# Patient Record
Sex: Female | Born: 1955 | Race: White | Hispanic: No | Marital: Married | Smoking: Former smoker
Health system: Southern US, Community
[De-identification: ages and names within clinical notes are randomized; demographics above are authoritative.]

## PROBLEM LIST (undated history)

## (undated) DIAGNOSIS — G5710 Meralgia paresthetica, unspecified lower limb: Secondary | ICD-10-CM

## (undated) DIAGNOSIS — M549 Dorsalgia, unspecified: Secondary | ICD-10-CM

## (undated) DIAGNOSIS — T7840XA Allergy, unspecified, initial encounter: Secondary | ICD-10-CM

## (undated) DIAGNOSIS — E785 Hyperlipidemia, unspecified: Secondary | ICD-10-CM

## (undated) DIAGNOSIS — K219 Gastro-esophageal reflux disease without esophagitis: Secondary | ICD-10-CM

## (undated) DIAGNOSIS — U071 COVID-19: Secondary | ICD-10-CM

## (undated) DIAGNOSIS — Z780 Asymptomatic menopausal state: Secondary | ICD-10-CM

## (undated) DIAGNOSIS — R7301 Impaired fasting glucose: Secondary | ICD-10-CM

## (undated) DIAGNOSIS — K5903 Drug induced constipation: Secondary | ICD-10-CM

## (undated) DIAGNOSIS — Z9889 Other specified postprocedural states: Secondary | ICD-10-CM

## (undated) DIAGNOSIS — F329 Major depressive disorder, single episode, unspecified: Secondary | ICD-10-CM

## (undated) DIAGNOSIS — I639 Cerebral infarction, unspecified: Secondary | ICD-10-CM

## (undated) DIAGNOSIS — I10 Essential (primary) hypertension: Secondary | ICD-10-CM

## (undated) DIAGNOSIS — K759 Inflammatory liver disease, unspecified: Secondary | ICD-10-CM

## (undated) DIAGNOSIS — E119 Type 2 diabetes mellitus without complications: Secondary | ICD-10-CM

## (undated) DIAGNOSIS — K760 Fatty (change of) liver, not elsewhere classified: Secondary | ICD-10-CM

## (undated) DIAGNOSIS — I719 Aortic aneurysm of unspecified site, without rupture: Secondary | ICD-10-CM

## (undated) DIAGNOSIS — F32A Depression, unspecified: Secondary | ICD-10-CM

## (undated) DIAGNOSIS — I739 Peripheral vascular disease, unspecified: Secondary | ICD-10-CM

## (undated) DIAGNOSIS — IMO0002 Reserved for concepts with insufficient information to code with codable children: Secondary | ICD-10-CM

## (undated) DIAGNOSIS — J189 Pneumonia, unspecified organism: Secondary | ICD-10-CM

## (undated) DIAGNOSIS — G43909 Migraine, unspecified, not intractable, without status migrainosus: Secondary | ICD-10-CM

## (undated) DIAGNOSIS — O24419 Gestational diabetes mellitus in pregnancy, unspecified control: Secondary | ICD-10-CM

## (undated) DIAGNOSIS — M419 Scoliosis, unspecified: Secondary | ICD-10-CM

## (undated) DIAGNOSIS — G8929 Other chronic pain: Secondary | ICD-10-CM

## (undated) DIAGNOSIS — T402X5A Adverse effect of other opioids, initial encounter: Secondary | ICD-10-CM

## (undated) DIAGNOSIS — R7303 Prediabetes: Secondary | ICD-10-CM

## (undated) DIAGNOSIS — F419 Anxiety disorder, unspecified: Secondary | ICD-10-CM

## (undated) DIAGNOSIS — M5136 Other intervertebral disc degeneration, lumbar region: Secondary | ICD-10-CM

## (undated) DIAGNOSIS — R011 Cardiac murmur, unspecified: Secondary | ICD-10-CM

## (undated) DIAGNOSIS — M51369 Other intervertebral disc degeneration, lumbar region without mention of lumbar back pain or lower extremity pain: Secondary | ICD-10-CM

## (undated) HISTORY — DX: Impaired fasting glucose: R73.01

## (undated) HISTORY — DX: Type 2 diabetes mellitus without complications: E11.9

## (undated) HISTORY — PX: ABDOMINAL HYSTERECTOMY: SHX81

## (undated) HISTORY — DX: Adverse effect of other opioids, initial encounter: T40.2X5A

## (undated) HISTORY — DX: Fatty (change of) liver, not elsewhere classified: K76.0

## (undated) HISTORY — DX: Gastro-esophageal reflux disease without esophagitis: K21.9

## (undated) HISTORY — DX: Aortic aneurysm of unspecified site, without rupture: I71.9

## (undated) HISTORY — DX: Essential (primary) hypertension: I10

## (undated) HISTORY — DX: Depression, unspecified: F32.A

## (undated) HISTORY — DX: Cerebral infarction, unspecified: I63.9

## (undated) HISTORY — PX: POLYPECTOMY: SHX149

## (undated) HISTORY — DX: Cardiac murmur, unspecified: R01.1

## (undated) HISTORY — PX: JOINT REPLACEMENT: SHX530

## (undated) HISTORY — DX: Drug induced constipation: K59.03

## (undated) HISTORY — DX: Other intervertebral disc degeneration, lumbar region without mention of lumbar back pain or lower extremity pain: M51.369

## (undated) HISTORY — DX: Hyperlipidemia, unspecified: E78.5

## (undated) HISTORY — DX: Dorsalgia, unspecified: M54.9

## (undated) HISTORY — DX: Migraine, unspecified, not intractable, without status migrainosus: G43.909

## (undated) HISTORY — DX: Major depressive disorder, single episode, unspecified: F32.9

## (undated) HISTORY — DX: Meralgia paresthetica, unspecified lower limb: G57.10

## (undated) HISTORY — PX: OTHER SURGICAL HISTORY: SHX169

## (undated) HISTORY — DX: Reserved for concepts with insufficient information to code with codable children: IMO0002

## (undated) HISTORY — DX: Asymptomatic menopausal state: Z78.0

## (undated) HISTORY — DX: Other intervertebral disc degeneration, lumbar region: M51.36

## (undated) HISTORY — DX: Other chronic pain: G89.29

## (undated) HISTORY — DX: Allergy, unspecified, initial encounter: T78.40XA

## (undated) HISTORY — PX: COLONOSCOPY: SHX174

## (undated) HISTORY — DX: Scoliosis, unspecified: M41.9

---

## 1992-03-27 DIAGNOSIS — J189 Pneumonia, unspecified organism: Secondary | ICD-10-CM

## 1992-03-27 HISTORY — DX: Pneumonia, unspecified organism: J18.9

## 1998-07-26 ENCOUNTER — Other Ambulatory Visit: Admission: RE | Admit: 1998-07-26 | Discharge: 1998-07-26 | Payer: Self-pay | Admitting: Obstetrics and Gynecology

## 1999-02-15 ENCOUNTER — Other Ambulatory Visit: Admission: RE | Admit: 1999-02-15 | Discharge: 1999-02-15 | Payer: Self-pay | Admitting: Otolaryngology

## 1999-11-30 ENCOUNTER — Encounter: Payer: Self-pay | Admitting: Otolaryngology

## 1999-11-30 ENCOUNTER — Encounter: Admission: RE | Admit: 1999-11-30 | Discharge: 1999-11-30 | Payer: Self-pay | Admitting: Otolaryngology

## 2001-02-13 ENCOUNTER — Other Ambulatory Visit: Admission: RE | Admit: 2001-02-13 | Discharge: 2001-02-13 | Payer: Self-pay | Admitting: Obstetrics and Gynecology

## 2002-08-27 ENCOUNTER — Other Ambulatory Visit: Admission: RE | Admit: 2002-08-27 | Discharge: 2002-08-27 | Payer: Self-pay | Admitting: Obstetrics and Gynecology

## 2003-07-29 ENCOUNTER — Ambulatory Visit (HOSPITAL_COMMUNITY): Admission: RE | Admit: 2003-07-29 | Discharge: 2003-07-29 | Payer: Self-pay | Admitting: Orthopedic Surgery

## 2003-07-29 ENCOUNTER — Ambulatory Visit (HOSPITAL_BASED_OUTPATIENT_CLINIC_OR_DEPARTMENT_OTHER): Admission: RE | Admit: 2003-07-29 | Discharge: 2003-07-29 | Payer: Self-pay | Admitting: Orthopedic Surgery

## 2004-02-10 ENCOUNTER — Other Ambulatory Visit: Admission: RE | Admit: 2004-02-10 | Discharge: 2004-02-10 | Payer: Self-pay | Admitting: Obstetrics and Gynecology

## 2004-06-06 ENCOUNTER — Inpatient Hospital Stay (HOSPITAL_COMMUNITY): Admission: RE | Admit: 2004-06-06 | Discharge: 2004-06-08 | Payer: Self-pay | Admitting: Obstetrics and Gynecology

## 2005-08-22 ENCOUNTER — Inpatient Hospital Stay (HOSPITAL_COMMUNITY): Admission: RE | Admit: 2005-08-22 | Discharge: 2005-08-24 | Payer: Self-pay | Admitting: Orthopedic Surgery

## 2006-03-28 ENCOUNTER — Ambulatory Visit: Payer: Self-pay | Admitting: Internal Medicine

## 2006-04-11 ENCOUNTER — Ambulatory Visit: Payer: Self-pay | Admitting: Internal Medicine

## 2009-03-27 HISTORY — PX: BACK SURGERY: SHX140

## 2009-06-03 DIAGNOSIS — J309 Allergic rhinitis, unspecified: Secondary | ICD-10-CM | POA: Insufficient documentation

## 2009-06-03 DIAGNOSIS — R011 Cardiac murmur, unspecified: Secondary | ICD-10-CM | POA: Insufficient documentation

## 2012-05-20 ENCOUNTER — Encounter (HOSPITAL_COMMUNITY): Payer: Self-pay | Admitting: *Deleted

## 2012-05-20 ENCOUNTER — Emergency Department (HOSPITAL_COMMUNITY): Payer: BC Managed Care – PPO

## 2012-05-20 ENCOUNTER — Emergency Department (HOSPITAL_COMMUNITY)
Admission: EM | Admit: 2012-05-20 | Discharge: 2012-05-20 | Disposition: A | Discharge status: Home / Self Care | Payer: BC Managed Care – PPO | Attending: Emergency Medicine | Admitting: Emergency Medicine

## 2012-05-20 DIAGNOSIS — Z7982 Long term (current) use of aspirin: Secondary | ICD-10-CM | POA: Insufficient documentation

## 2012-05-20 DIAGNOSIS — Z79899 Other long term (current) drug therapy: Secondary | ICD-10-CM | POA: Insufficient documentation

## 2012-05-20 DIAGNOSIS — R110 Nausea: Secondary | ICD-10-CM | POA: Insufficient documentation

## 2012-05-20 DIAGNOSIS — Z90710 Acquired absence of both cervix and uterus: Secondary | ICD-10-CM | POA: Insufficient documentation

## 2012-05-20 DIAGNOSIS — R109 Unspecified abdominal pain: Secondary | ICD-10-CM

## 2012-05-20 DIAGNOSIS — Z8632 Personal history of gestational diabetes: Secondary | ICD-10-CM | POA: Insufficient documentation

## 2012-05-20 DIAGNOSIS — R1013 Epigastric pain: Secondary | ICD-10-CM | POA: Insufficient documentation

## 2012-05-20 HISTORY — DX: Gestational diabetes mellitus in pregnancy, unspecified control: O24.419

## 2012-05-20 LAB — COMPREHENSIVE METABOLIC PANEL
ALT: 16 U/L (ref 0–35)
AST: 14 U/L (ref 0–37)
Albumin: 4 g/dL (ref 3.5–5.2)
Alkaline Phosphatase: 78 U/L (ref 39–117)
BUN: 13 mg/dL (ref 6–23)
CO2: 23 mEq/L (ref 19–32)
Calcium: 9.4 mg/dL (ref 8.4–10.5)
Chloride: 103 mEq/L (ref 96–112)
Creatinine, Ser: 0.46 mg/dL — ABNORMAL LOW (ref 0.50–1.10)
GFR calc Af Amer: >90 mL/min (ref >90)
GFR calc non Af Amer: >90 mL/min (ref >90)
Glucose, Bld: 112 mg/dL — ABNORMAL HIGH (ref 70–99)
Potassium: 3.6 mEq/L (ref 3.5–5.1)
Sodium: 137 mEq/L (ref 135–145)
Total Bilirubin: 0.2 mg/dL — ABNORMAL LOW (ref 0.3–1.2)
Total Protein: 7 g/dL (ref 6.0–8.3)

## 2012-05-20 LAB — CBC
HCT: 38.4 % (ref 36.0–46.0)
Hemoglobin: 13 g/dL (ref 12.0–15.0)
MCH: 31.1 pg (ref 26.0–34.0)
MCHC: 33.9 g/dL (ref 30.0–36.0)
MCV: 91.9 fL (ref 78.0–100.0)
Platelets: 260 10*3/uL (ref 150–400)
RBC: 4.18 MIL/uL (ref 3.87–5.11)
RDW: 12.6 % (ref 11.5–15.5)
WBC: 9.1 10*3/uL (ref 4.0–10.5)

## 2012-05-20 LAB — URINALYSIS, ROUTINE W REFLEX MICROSCOPIC
Bilirubin Urine: NEGATIVE
Glucose, UA: NEGATIVE mg/dL
Hgb urine dipstick: NEGATIVE
Ketones, ur: NEGATIVE mg/dL
Nitrite: NEGATIVE
Protein, ur: NEGATIVE mg/dL
Specific Gravity, Urine: 1.014 (ref 1.005–1.030)
Urobilinogen, UA: 0.2 mg/dL (ref 0.0–1.0)
pH: 5.5 (ref 5.0–8.0)

## 2012-05-20 LAB — URINE MICROSCOPIC-ADD ON

## 2012-05-20 LAB — LIPASE, BLOOD: Lipase: 39 U/L (ref 11–59)

## 2012-05-20 MED ORDER — PANTOPRAZOLE SODIUM 20 MG PO TBEC: 20.0000 mg | DELAYED_RELEASE_TABLET | Freq: Every day | ORAL | Status: DC

## 2012-05-20 NOTE — ED Notes (Signed)
Pt reports epigastric pain intermittently x 1 week, became worse Saturday.  Pt reports pain is worse when eating, states that she starts to feel full when she starts eating and her mid upper abd feels "hard."  Pt reports mild nausea with this time as well.

## 2012-05-20 NOTE — ED Provider Notes (Signed)
History    56yF with abdominal pain. Epigastric. Does not radiate. First noticed when woke up Saturday morning. Relatively constant since. "Comes in waves." Doesn't completely subside though. Sometime worse after eating. No appreciable exacerbating or relieving factors otherwise. Nausea with more intense pain. No vomiting.  No urinary complaints. No fever or chills. No hx of PUD/reflux. Occasional ETOH. No hx of pancreatitis. Tried taking TUMS on Saturday w/o no effect. Surgical hx significant for hysterectomy. Declining pain meds at this time.   CSN: [id number]  Arrival date & time 05/20/12  1400   First MD Initiated Contact with Patient 05/20/12 1458      Chief Complaint  Patient presents with  . Abdominal Pain    (Consider location/radiation/quality/duration/timing/severity/associated sxs/prior treatment) HPI  Past Medical History  Diagnosis Date  . Gestational diabetes     Past Surgical History  Procedure Laterality Date  . Back surgery    . Joint replacement      L knee  . Abdominal hysterectomy      No family history on file.  History  Substance Use Topics  . Smoking status: Never Smoker   . Smokeless tobacco: Not on file  . Alcohol Use: Yes     Comment: occa    OB History   Grav Para Term Preterm Abortions TAB SAB Ect Mult Living                  Review of Systems  All systems reviewed and negative, other than as noted in HPI.   Allergies  Sulfa antibiotics  Home Medications   Current Outpatient Rx  Name  Route  Sig  Dispense  Refill  . aspirin EC 81 MG tablet   Oral   Take 81 mg by mouth daily.         . diphenhydramine-acetaminophen (TYLENOL PM) 25-500 MG TABS   Oral   Take 1 tablet by mouth at bedtime as needed. For pain.         . fish oil-omega-3 fatty acids 1000 MG capsule   Oral   Take 1 g by mouth daily.         . vitamin C (ASCORBIC ACID) 500 MG tablet   Oral   Take 500 mg by mouth daily.           BP 144/85  Pulse  93  Temp(Src) 98.3 F (36.8 C) (Oral)  SpO2 100%  Physical Exam  Nursing note and vitals reviewed. Constitutional: She appears well-developed and well-nourished. No distress.  Sitting in chair beside bed. NAD.   HENT:  Head: Normocephalic and atraumatic.  Eyes: Conjunctivae are normal. Right eye exhibits no discharge. Left eye exhibits no discharge.  Neck: Neck supple.  Cardiovascular: Normal rate, regular rhythm and normal heart sounds.  Exam reveals no gallop and no friction rub.   No murmur heard. Pulmonary/Chest: Effort normal and breath sounds normal. No respiratory distress.  Abdominal: Soft. She exhibits no distension and no mass. There is tenderness. There is no rebound and no guarding.  Mild epigastric tenderness w/o rebound or guarding. No distension. No pulsatile mass.   Genitourinary:  No cva tenderness  Musculoskeletal: She exhibits no edema and no tenderness.  Neurological: She is alert.  Skin: Skin is warm and dry.  Psychiatric: She has a normal mood and affect. Her behavior is normal. Thought content normal.    ED Course  Procedures (including critical care time)  Labs Reviewed  COMPREHENSIVE METABOLIC PANEL - Abnormal; Notable for the  following:    Glucose, Bld 112 (*)    Creatinine, Ser 0.46 (*)    Total Bilirubin 0.2 (*)    All other components within normal limits  URINALYSIS, ROUTINE W REFLEX MICROSCOPIC - Abnormal; Notable for the following:    Leukocytes, UA TRACE (*)    All other components within normal limits  CBC  LIPASE, BLOOD  URINE MICROSCOPIC-ADD ON   US Abdomen Complete  05/20/2012  *RADIOLOGY REPORT*  Clinical Data:  Abdominal pain, nausea.  COMPLETE ABDOMINAL ULTRASOUND  Comparison:  None.  Findings:  Gallbladder:  Gallbladder is contracted.  The patient was not n.p.o. for the study.  No wall thickening.  No visible stones. Negative sonographic Murphy's.  Common bile duct:   Normal caliber, 3 mm.  Liver:  Increased echotexture suggesting  fatty infiltration.  No focal abnormality.  IVC:  Appears normal.  Pancreas:  No focal abnormality seen.  Spleen:  Within normal limits in size and echotexture.  Right Kidney:   Normal in size and parenchymal echogenicity.  No evidence of mass or hydronephrosis.  Left Kidney:  Normal in size and parenchymal echogenicity.  No evidence of mass or hydronephrosis.  Abdominal aorta:  No aneurysm identified.  IMPRESSION: Fatty infiltration of the liver.  Contracted gallbladder.  The patient was not n.p.o. for the study. No visible stones or evidence of acute cholecystitis.   Original Report Authenticated By: Charlett Nose, M.D.    EKG:  Rhythm: normal sinus Rate: 85 Axis: normal Intervals: normal ST segments: normal   1. Abdominal pain       MDM  56yF with epigastric pain. Mild tenderness on exam. Well appearing. Afebrile and HD stable. Possibly reflux or PUD. Possibly biliary colic with pain "coming in waves" and worse after eating. Will check [place] and labs. Will check EKG as well, although very atypical symptoms for ACS. CT likely very low yield.   5:03 PM W/u unremarkable. [place] w/ fatty liver, otherwise unremarkable. Lipase normal. Only mild tenderness on exam. Very low suspicion for acute emergent surgical process. Will give trial of protonix. Emergent return precautions discussed. Outpt FU otherwise.        Raeford Razor, MD 05/20/12 [phone]

## 2012-05-29 DIAGNOSIS — K219 Gastro-esophageal reflux disease without esophagitis: Secondary | ICD-10-CM | POA: Insufficient documentation

## 2012-10-07 DIAGNOSIS — S83419A Sprain of medial collateral ligament of unspecified knee, initial encounter: Secondary | ICD-10-CM | POA: Insufficient documentation

## 2012-10-07 DIAGNOSIS — S93401A Sprain of unspecified ligament of right ankle, initial encounter: Secondary | ICD-10-CM | POA: Insufficient documentation

## 2013-03-07 ENCOUNTER — Encounter: Payer: Self-pay | Admitting: Internal Medicine

## 2013-03-11 DIAGNOSIS — M549 Dorsalgia, unspecified: Secondary | ICD-10-CM | POA: Insufficient documentation

## 2013-03-11 DIAGNOSIS — M5417 Radiculopathy, lumbosacral region: Secondary | ICD-10-CM | POA: Insufficient documentation

## 2013-04-01 ENCOUNTER — Encounter: Payer: Self-pay | Admitting: Internal Medicine

## 2013-05-19 ENCOUNTER — Other Ambulatory Visit: Payer: Self-pay

## 2013-05-19 DIAGNOSIS — Z1231 Encounter for screening mammogram for malignant neoplasm of breast: Secondary | ICD-10-CM

## 2013-06-02 ENCOUNTER — Ambulatory Visit: Payer: BC Managed Care – PPO

## 2013-06-09 DIAGNOSIS — R809 Proteinuria, unspecified: Secondary | ICD-10-CM | POA: Insufficient documentation

## 2013-06-20 ENCOUNTER — Encounter: Payer: Self-pay | Admitting: Obstetrics and Gynecology

## 2013-07-30 DIAGNOSIS — G8929 Other chronic pain: Secondary | ICD-10-CM | POA: Insufficient documentation

## 2013-07-30 DIAGNOSIS — M5416 Radiculopathy, lumbar region: Secondary | ICD-10-CM | POA: Insufficient documentation

## 2014-01-02 DIAGNOSIS — M545 Low back pain, unspecified: Secondary | ICD-10-CM | POA: Insufficient documentation

## 2014-01-22 DIAGNOSIS — M533 Sacrococcygeal disorders, not elsewhere classified: Secondary | ICD-10-CM | POA: Insufficient documentation

## 2014-07-29 DIAGNOSIS — F329 Major depressive disorder, single episode, unspecified: Secondary | ICD-10-CM | POA: Insufficient documentation

## 2015-01-21 DIAGNOSIS — M542 Cervicalgia: Secondary | ICD-10-CM | POA: Insufficient documentation

## 2016-02-29 DIAGNOSIS — I10 Essential (primary) hypertension: Secondary | ICD-10-CM | POA: Insufficient documentation

## 2016-04-05 ENCOUNTER — Encounter: Payer: Self-pay | Admitting: Gastroenterology

## 2016-04-17 ENCOUNTER — Encounter: Payer: Self-pay | Admitting: Gastroenterology

## 2016-06-01 ENCOUNTER — Ambulatory Visit (AMBULATORY_SURGERY_CENTER): Payer: Self-pay | Admitting: *Deleted

## 2016-06-01 VITALS — Ht 66.0 in | Wt 160.6 lb

## 2016-06-01 DIAGNOSIS — Z1211 Encounter for screening for malignant neoplasm of colon: Secondary | ICD-10-CM

## 2016-06-01 MED ORDER — NA SULFATE-K SULFATE-MG SULF 17.5-3.13-1.6 GM/177ML PO SOLN: 1.0000 | Freq: Once | ORAL | 0 refills | Status: AC

## 2016-06-01 NOTE — Progress Notes (Signed)
No egg or soy allergy known to patient   issues with past sedation with any surgeries  or procedures of PONV, no intubation problems  No diet pills per patient No home 02 use per patient  No blood thinners per patient  Pt states  issues with constipation due to pain meds- pt uses OTC meds prn - if no stool in 2 days she uses -not daily stools - stools are hard - large hard stools then can pass easier- will do 2 day prep  No A fib or A flutter

## 2016-06-06 ENCOUNTER — Encounter: Payer: Self-pay | Admitting: Gastroenterology

## 2016-06-12 ENCOUNTER — Encounter: Payer: Self-pay | Admitting: Gastroenterology

## 2016-06-12 ENCOUNTER — Ambulatory Visit (AMBULATORY_SURGERY_CENTER): Payer: BLUE CROSS/BLUE SHIELD | Admitting: Gastroenterology

## 2016-06-12 VITALS — BP 119/67 | HR 78 | Temp 98.4°F | Resp 10 | Ht 66.0 in | Wt 160.0 lb

## 2016-06-12 DIAGNOSIS — Z1211 Encounter for screening for malignant neoplasm of colon: Secondary | ICD-10-CM

## 2016-06-12 DIAGNOSIS — Z1212 Encounter for screening for malignant neoplasm of rectum: Secondary | ICD-10-CM

## 2016-06-12 DIAGNOSIS — D128 Benign neoplasm of rectum: Secondary | ICD-10-CM | POA: Diagnosis not present

## 2016-06-12 DIAGNOSIS — K635 Polyp of colon: Secondary | ICD-10-CM

## 2016-06-12 DIAGNOSIS — D125 Benign neoplasm of sigmoid colon: Secondary | ICD-10-CM

## 2016-06-12 MED ORDER — SODIUM CHLORIDE 0.9 % IV SOLN: 500.0000 mL | INTRAVENOUS | Status: DC

## 2016-06-12 NOTE — Patient Instructions (Signed)
YOU HAD AN ENDOSCOPIC PROCEDURE TODAY AT [facility]:   Refer to the procedure report that was given to you for any specific questions about what was found during the examination.  If the procedure report does not answer your questions, please call your gastroenterologist to clarify.  If you requested that your care partner not be given the details of your procedure findings, then the procedure report has been included in a sealed envelope for you to review at your convenience later.  YOU SHOULD EXPECT: Some feelings of bloating in the abdomen. Passage of more gas than usual.  Walking can help get rid of the air that was put into your GI tract during the procedure and reduce the bloating. If you had a lower endoscopy (such as a colonoscopy or flexible sigmoidoscopy) you may notice spotting of blood in your stool or on the toilet paper. If you underwent a bowel prep for your procedure, you may not have a normal bowel movement for a few days.  Please Note:  You might notice some irritation and congestion in your nose or some drainage.  This is from the oxygen used during your procedure.  There is no need for concern and it should clear up in a day or so.  SYMPTOMS TO REPORT IMMEDIATELY:   Following lower endoscopy (colonoscopy or flexible sigmoidoscopy):  Excessive amounts of blood in the stool  Significant tenderness or worsening of abdominal pains  Swelling of the abdomen that is new, acute  Fever of 100F or higher  For urgent or emergent issues, a gastroenterologist can be reached at any hour by calling [phone].  DIET:  We do recommend a small meal at first, but then you may proceed to your regular diet.  Drink plenty of fluids but you should avoid alcoholic beverages for 24 hours.  ACTIVITY:  You should plan to take it easy for the rest of today and you should NOT DRIVE or use heavy machinery until tomorrow (because of the sedation medicines used during the test).     FOLLOW UP: Our staff will call the number listed on your records the next business day following your procedure to check on you and address any questions or concerns that you may have regarding the information given to you following your procedure. If we do not reach you, we will leave a message.  However, if you are feeling well and you are not experiencing any problems, there is no need to return our call.  We will assume that you have returned to your regular daily activities without incident.  If any biopsies were taken you will be contacted by phone or by letter within the next 1-3 weeks.  Please call us at [phone] if you have not heard about the biopsies in 3 weeks.   SIGNATURES/CONFIDENTIALITY: You and/or your care partner have signed paperwork which will be entered into your electronic medical record.  These signatures attest to the fact that that the information above on your After Visit Summary has been reviewed and is understood.  Full responsibility of the confidentiality of this discharge information lies with you and/or your care-partner.  Await pathology  Please read over handouts about polyps, diverticulosis and high fiber diets  Please continue your normal medications

## 2016-06-12 NOTE — Op Note (Signed)
[place] Patient Name: Deborah Peters Procedure Date: 06/12/2016 10:29 AM MRN: [id number] Endoscopist: Mauri Pole , MD Age: 61 Referring MD:  Date of Birth: December 29, 1955 Gender: Female Account #: [id number] Procedure:                Colonoscopy Indications:              Screening for colorectal malignant neoplasm, Last                            colonoscopy: 2008 Medicines:                Monitored Anesthesia Care Procedure:                Pre-Anesthesia Assessment:                           - Prior to the procedure, a History and Physical                            was performed, and patient medications and                            allergies were reviewed. The patient's tolerance of                            previous anesthesia was also reviewed. The risks                            and benefits of the procedure and the sedation                            options and risks were discussed with the patient.                            All questions were answered, and informed consent                            was obtained. Prior Anticoagulants: The patient has                            taken no previous anticoagulant or antiplatelet                            agents. ASA Grade Assessment: II - A patient with                            mild systemic disease. After reviewing the risks                            and benefits, the patient was deemed in                            satisfactory condition to undergo the procedure.  After obtaining informed consent, the colonoscope                            was passed under direct vision. Throughout the                            procedure, the patient's blood pressure, pulse, and                            oxygen saturations were monitored continuously. The                            Colonoscope was introduced through the anus and                            advanced to the the terminal ileum,  with                            identification of the appendiceal orifice and IC                            valve. The colonoscopy was performed without                            difficulty. The patient tolerated the procedure                            well. The quality of the bowel preparation was                            good. The terminal ileum, ileocecal valve,                            appendiceal orifice, and rectum were photographed. Scope In: 10:43:32 AM Scope Out: 11:12:33 AM Scope Withdrawal Time: 0 hours 23 minutes 30 seconds  Total Procedure Duration: 0 hours 29 minutes 1 second  Findings:                 The perianal and digital rectal examinations were                            normal.                           Five sessile polyps were found in the rectum and                            sigmoid colon. The polyps were 4 to 6 mm in size.                            These polyps were removed with a cold snare.                            Resection and retrieval were complete.  Two sessile polyps were found in the rectum. The                            polyps were 1 to 2 mm in size. These polyps were                            removed with a cold biopsy forceps. Resection and                            retrieval were complete.                           Multiple small-mouthed diverticula were found in                            the sigmoid colon and descending colon.                           The exam was otherwise without abnormality. Complications:            No immediate complications. Estimated Blood Loss:     Estimated blood loss was minimal. Impression:               - Five 4 to 6 mm polyps in the rectum and in the                            sigmoid colon, removed with a cold snare. Resected                            and retrieved.                           - Two 1 to 2 mm polyps in the rectum, removed with                            a cold  biopsy forceps. Resected and retrieved.                           - Diverticulosis in the sigmoid colon and in the                            descending colon.                           - The examination was otherwise normal. Recommendation:           - Patient has a contact number available for                            emergencies. The signs and symptoms of potential                            delayed complications were discussed with the  patient. Return to normal activities tomorrow.                            Written discharge instructions were provided to the                            patient.                           - Resume previous diet.                           - Continue present medications.                           - Await pathology results.                           - Repeat colonoscopy in 3 - 5 years for                            surveillance based on pathology results. Mauri Pole, MD 06/12/2016 11:20:03 AM This report has been signed electronically.

## 2016-06-12 NOTE — Progress Notes (Signed)
Called to room to assist during endoscopic procedure.  Patient ID and intended procedure confirmed with present staff. Received instructions for my participation in the procedure from the performing physician.  

## 2016-06-12 NOTE — Progress Notes (Signed)
Pt's states no medical or surgical changes since previsit or office visit. Maw

## 2016-06-13 ENCOUNTER — Telehealth: Payer: Self-pay | Admitting: *Deleted

## 2016-06-13 ENCOUNTER — Telehealth: Payer: Self-pay

## 2016-06-13 NOTE — Telephone Encounter (Signed)
Left message on answering machine. 

## 2016-06-13 NOTE — Telephone Encounter (Signed)
No answer and unable to leave message for second call back follow up. SM

## 2016-06-16 ENCOUNTER — Encounter: Payer: Self-pay | Admitting: Gastroenterology

## 2016-07-31 DIAGNOSIS — G4700 Insomnia, unspecified: Secondary | ICD-10-CM | POA: Insufficient documentation

## 2016-09-22 DIAGNOSIS — J329 Chronic sinusitis, unspecified: Secondary | ICD-10-CM | POA: Insufficient documentation

## 2017-02-27 DIAGNOSIS — M545 Low back pain: Secondary | ICD-10-CM | POA: Diagnosis not present

## 2017-02-27 DIAGNOSIS — R030 Elevated blood-pressure reading, without diagnosis of hypertension: Secondary | ICD-10-CM | POA: Diagnosis not present

## 2017-02-27 DIAGNOSIS — M5410 Radiculopathy, site unspecified: Secondary | ICD-10-CM | POA: Diagnosis not present

## 2017-03-15 DIAGNOSIS — M5410 Radiculopathy, site unspecified: Secondary | ICD-10-CM | POA: Diagnosis not present

## 2017-03-15 DIAGNOSIS — M5416 Radiculopathy, lumbar region: Secondary | ICD-10-CM | POA: Diagnosis not present

## 2017-04-30 DIAGNOSIS — E119 Type 2 diabetes mellitus without complications: Secondary | ICD-10-CM | POA: Diagnosis not present

## 2017-04-30 DIAGNOSIS — I10 Essential (primary) hypertension: Secondary | ICD-10-CM | POA: Diagnosis not present

## 2017-04-30 DIAGNOSIS — E7849 Other hyperlipidemia: Secondary | ICD-10-CM | POA: Diagnosis not present

## 2017-05-22 DIAGNOSIS — G8929 Other chronic pain: Secondary | ICD-10-CM | POA: Diagnosis not present

## 2017-05-22 DIAGNOSIS — M5441 Lumbago with sciatica, right side: Secondary | ICD-10-CM | POA: Diagnosis not present

## 2017-05-22 DIAGNOSIS — R030 Elevated blood-pressure reading, without diagnosis of hypertension: Secondary | ICD-10-CM | POA: Diagnosis not present

## 2017-06-13 DIAGNOSIS — Z79891 Long term (current) use of opiate analgesic: Secondary | ICD-10-CM | POA: Diagnosis not present

## 2017-06-13 DIAGNOSIS — Z79899 Other long term (current) drug therapy: Secondary | ICD-10-CM | POA: Diagnosis not present

## 2017-06-13 DIAGNOSIS — G8929 Other chronic pain: Secondary | ICD-10-CM | POA: Diagnosis not present

## 2017-06-13 DIAGNOSIS — M545 Low back pain: Secondary | ICD-10-CM | POA: Diagnosis not present

## 2017-06-13 DIAGNOSIS — M542 Cervicalgia: Secondary | ICD-10-CM | POA: Diagnosis not present

## 2017-06-13 DIAGNOSIS — Z5181 Encounter for therapeutic drug level monitoring: Secondary | ICD-10-CM | POA: Diagnosis not present

## 2017-06-13 DIAGNOSIS — R030 Elevated blood-pressure reading, without diagnosis of hypertension: Secondary | ICD-10-CM | POA: Diagnosis not present

## 2017-07-31 DIAGNOSIS — G4709 Other insomnia: Secondary | ICD-10-CM | POA: Diagnosis not present

## 2017-07-31 DIAGNOSIS — F3289 Other specified depressive episodes: Secondary | ICD-10-CM | POA: Diagnosis not present

## 2017-07-31 DIAGNOSIS — Z0000 Encounter for general adult medical examination without abnormal findings: Secondary | ICD-10-CM | POA: Diagnosis not present

## 2017-07-31 DIAGNOSIS — Z23 Encounter for immunization: Secondary | ICD-10-CM | POA: Diagnosis not present

## 2017-07-31 DIAGNOSIS — R808 Other proteinuria: Secondary | ICD-10-CM | POA: Diagnosis not present

## 2017-07-31 DIAGNOSIS — J3089 Other allergic rhinitis: Secondary | ICD-10-CM | POA: Diagnosis not present

## 2017-07-31 DIAGNOSIS — K219 Gastro-esophageal reflux disease without esophagitis: Secondary | ICD-10-CM | POA: Diagnosis not present

## 2017-07-31 DIAGNOSIS — M5136 Other intervertebral disc degeneration, lumbar region: Secondary | ICD-10-CM | POA: Diagnosis not present

## 2017-07-31 DIAGNOSIS — E119 Type 2 diabetes mellitus without complications: Secondary | ICD-10-CM | POA: Diagnosis not present

## 2017-07-31 DIAGNOSIS — E7849 Other hyperlipidemia: Secondary | ICD-10-CM | POA: Diagnosis not present

## 2017-07-31 DIAGNOSIS — Z1389 Encounter for screening for other disorder: Secondary | ICD-10-CM | POA: Diagnosis not present

## 2017-07-31 DIAGNOSIS — Z6823 Body mass index (BMI) 23.0-23.9, adult: Secondary | ICD-10-CM | POA: Diagnosis not present

## 2017-09-11 DIAGNOSIS — M5416 Radiculopathy, lumbar region: Secondary | ICD-10-CM | POA: Diagnosis not present

## 2017-10-09 DIAGNOSIS — M542 Cervicalgia: Secondary | ICD-10-CM | POA: Diagnosis not present

## 2017-10-09 DIAGNOSIS — M5410 Radiculopathy, site unspecified: Secondary | ICD-10-CM | POA: Diagnosis not present

## 2017-10-09 DIAGNOSIS — R030 Elevated blood-pressure reading, without diagnosis of hypertension: Secondary | ICD-10-CM | POA: Diagnosis not present

## 2017-10-09 DIAGNOSIS — M545 Low back pain: Secondary | ICD-10-CM | POA: Diagnosis not present

## 2017-10-22 DIAGNOSIS — M542 Cervicalgia: Secondary | ICD-10-CM | POA: Diagnosis not present

## 2017-11-23 DIAGNOSIS — M542 Cervicalgia: Secondary | ICD-10-CM | POA: Diagnosis not present

## 2017-12-21 ENCOUNTER — Emergency Department (HOSPITAL_COMMUNITY): Payer: PPO

## 2017-12-21 ENCOUNTER — Emergency Department (HOSPITAL_COMMUNITY)
Admission: EM | Admit: 2017-12-21 | Discharge: 2017-12-21 | Disposition: A | Discharge status: Home / Self Care | Payer: PPO | Attending: Emergency Medicine | Admitting: Emergency Medicine

## 2017-12-21 ENCOUNTER — Other Ambulatory Visit: Payer: Self-pay

## 2017-12-21 ENCOUNTER — Encounter (HOSPITAL_COMMUNITY): Payer: Self-pay | Admitting: *Deleted

## 2017-12-21 DIAGNOSIS — Z87891 Personal history of nicotine dependence: Secondary | ICD-10-CM | POA: Diagnosis not present

## 2017-12-21 DIAGNOSIS — R1084 Generalized abdominal pain: Secondary | ICD-10-CM | POA: Diagnosis present

## 2017-12-21 DIAGNOSIS — R1013 Epigastric pain: Secondary | ICD-10-CM | POA: Diagnosis not present

## 2017-12-21 DIAGNOSIS — Z6823 Body mass index (BMI) 23.0-23.9, adult: Secondary | ICD-10-CM | POA: Diagnosis not present

## 2017-12-21 DIAGNOSIS — R1010 Upper abdominal pain, unspecified: Secondary | ICD-10-CM | POA: Diagnosis not present

## 2017-12-21 DIAGNOSIS — I10 Essential (primary) hypertension: Secondary | ICD-10-CM | POA: Diagnosis not present

## 2017-12-21 DIAGNOSIS — Z79899 Other long term (current) drug therapy: Secondary | ICD-10-CM | POA: Insufficient documentation

## 2017-12-21 DIAGNOSIS — Z7982 Long term (current) use of aspirin: Secondary | ICD-10-CM | POA: Diagnosis not present

## 2017-12-21 DIAGNOSIS — R1011 Right upper quadrant pain: Secondary | ICD-10-CM | POA: Diagnosis not present

## 2017-12-21 DIAGNOSIS — K429 Umbilical hernia without obstruction or gangrene: Secondary | ICD-10-CM | POA: Diagnosis not present

## 2017-12-21 LAB — COMPREHENSIVE METABOLIC PANEL
ALT: 11 U/L (ref 0–44)
AST: 13 U/L — ABNORMAL LOW (ref 15–41)
Albumin: 4.6 g/dL (ref 3.5–5.0)
Alkaline Phosphatase: 62 U/L (ref 38–126)
Anion gap: 11 (ref 5–15)
BUN: 9 mg/dL (ref 8–23)
CO2: 26 mmol/L (ref 22–32)
Calcium: 9.9 mg/dL (ref 8.9–10.3)
Chloride: 108 mmol/L (ref 98–111)
Creatinine, Ser: 0.69 mg/dL (ref 0.44–1.00)
GFR calc Af Amer: >60 mL/min (ref >60)
GFR calc non Af Amer: >60 mL/min (ref >60)
Glucose, Bld: 94 mg/dL (ref 70–99)
Potassium: 4 mmol/L (ref 3.5–5.1)
Sodium: 145 mmol/L (ref 135–145)
Total Bilirubin: 0.6 mg/dL (ref 0.3–1.2)
Total Protein: 7.1 g/dL (ref 6.5–8.1)

## 2017-12-21 LAB — CBC WITH DIFFERENTIAL/PLATELET
Basophils Absolute: 0 10*3/uL (ref 0.0–0.1)
Basophils Relative: 0 %
Eosinophils Absolute: 0.2 10*3/uL (ref 0.0–0.7)
Eosinophils Relative: 2 %
HCT: 41.4 % (ref 36.0–46.0)
Hemoglobin: 13.6 g/dL (ref 12.0–15.0)
Lymphocytes Relative: 45 %
Lymphs Abs: 3.9 10*3/uL (ref 0.7–4.0)
MCH: 30.8 pg (ref 26.0–34.0)
MCHC: 32.9 g/dL (ref 30.0–36.0)
MCV: 93.9 fL (ref 78.0–100.0)
Monocytes Absolute: 0.6 10*3/uL (ref 0.1–1.0)
Monocytes Relative: 7 %
Neutro Abs: 4 10*3/uL (ref 1.7–7.7)
Neutrophils Relative %: 46 %
Platelets: 289 10*3/uL (ref 150–400)
RBC: 4.41 MIL/uL (ref 3.87–5.11)
RDW: 13.7 % (ref 11.5–15.5)
WBC: 8.8 10*3/uL (ref 4.0–10.5)

## 2017-12-21 LAB — LIPASE, BLOOD: Lipase: 32 U/L (ref 11–51)

## 2017-12-21 MED ORDER — HYDROMORPHONE HCL 1 MG/ML IJ SOLN
1.0000 mg | Freq: Once | INTRAMUSCULAR | Status: AC
  Administered 2017-12-21: 1 mg via INTRAVENOUS
  Filled 2017-12-21: qty 1

## 2017-12-21 MED ORDER — ONDANSETRON HCL 4 MG/2ML IJ SOLN
4.0000 mg | Freq: Once | INTRAMUSCULAR | Status: AC
  Administered 2017-12-21: 4 mg via INTRAVENOUS
  Filled 2017-12-21: qty 2

## 2017-12-21 MED ORDER — SODIUM CHLORIDE 0.9 % IV SOLN
80.0000 mg | Freq: Once | INTRAVENOUS | Status: AC
  Administered 2017-12-21: 80 mg via INTRAVENOUS
  Filled 2017-12-21: qty 80

## 2017-12-21 MED ORDER — HYDROCODONE-ACETAMINOPHEN 5-325 MG PO TABS: 1.0000 | ORAL_TABLET | ORAL | 0 refills | Status: DC | PRN

## 2017-12-21 MED ORDER — PANTOPRAZOLE SODIUM 40 MG PO TBEC: 40.0000 mg | DELAYED_RELEASE_TABLET | Freq: Two times a day (BID) | ORAL | 0 refills | Status: DC

## 2017-12-21 MED ORDER — GI COCKTAIL ~~LOC~~
30.0000 mL | Freq: Once | ORAL | Status: AC
  Administered 2017-12-21: 30 mL via ORAL
  Filled 2017-12-21: qty 30

## 2017-12-21 MED ORDER — HYDROCODONE-ACETAMINOPHEN 5-325 MG PO TABS: 1.0000 | ORAL_TABLET | Freq: Four times a day (QID) | ORAL | 0 refills | Status: DC | PRN

## 2017-12-21 MED ORDER — ONDANSETRON HCL 4 MG PO TABS: 4.0000 mg | ORAL_TABLET | Freq: Three times a day (TID) | ORAL | 0 refills | Status: DC | PRN

## 2017-12-21 MED ORDER — HYDROMORPHONE HCL 1 MG/ML IJ SOLN
0.5000 mg | Freq: Once | INTRAMUSCULAR | Status: AC
  Administered 2017-12-21: 0.5 mg via INTRAVENOUS
  Filled 2017-12-21: qty 1

## 2017-12-21 MED ORDER — IOPAMIDOL (ISOVUE-300) INJECTION 61%
INTRAVENOUS | Status: AC
  Filled 2017-12-21: qty 100

## 2017-12-21 MED ORDER — IOPAMIDOL (ISOVUE-300) INJECTION 61%
100.0000 mL | Freq: Once | INTRAVENOUS | Status: AC | PRN
  Administered 2017-12-21: 100 mL via INTRAVENOUS

## 2017-12-21 NOTE — ED Triage Notes (Signed)
Pt sent by PCP for RUQ pain and nausea, PA was concerned she's having some issue with her gallbladder.

## 2017-12-21 NOTE — ED Notes (Signed)
Patient verbalized understanding of discharge instructions, no questions. Patient ambulated out of ED with steady gait in no distress.  

## 2017-12-21 NOTE — ED Notes (Signed)
Pt stated that IV slipped partially out during [place], but pt put it back in arm.

## 2017-12-21 NOTE — ED Provider Notes (Signed)
[facility] DEPT Provider Note   CSN: [id number] Arrival date & time: 12/21/17  1323     History   Chief Complaint Chief Complaint  Patient presents with  . Abdominal Pain    HPI LUVENA WENTLING is a 62 y.o. female.  HPI  62 year old female with abdominal pain.  Worsening pain since last Friday.  Initially waxed and waned but not completely go away.  The pain is now constant and more severe since yesterday.  Pain is from just above the umbilicus to the epigastrium into the right upper quadrant.  Will occasionally feel into her back.  No change after eating but does report some early satiety.  Nausea.  No vomiting.  No fevers or chills.  Reports that she drinks alcohol only occasionally.  No known past history of gallbladder issues.  She started taking Prilosec last week when symptoms started feeling that that may be reflux although they start on her typical reflux symptoms.  She reports no improvement despite this.  She does use NSAIDs on a regular basis.  She has been taking Aleve twice a day for several months for chronic back pain. No blood in her stool or melena.   Past Medical History:  Diagnosis Date  . Allergy   . Chronic back pain   . Constipation due to opioid therapy   . Depression   . GERD (gastroesophageal reflux disease)    past hx of   . Gestational diabetes    no meds as of 06-01-2016.  diet controlled  . Heart murmur   . Hyperlipidemia   . Hypertension    pt this pain related - id occasionally high   . Ulcer    years ago. gastric ulcer.    There are no active problems to display for this patient.   Past Surgical History:  Procedure Laterality Date  . ABDOMINAL HYSTERECTOMY    . BACK SURGERY  2011   L5 S1  . COLONOSCOPY    . JOINT REPLACEMENT     L knee  . knee surgeries     x 8 before replacement   . POLYPECTOMY     HPP in 2008 DB     OB History   None      Home Medications    Prior to Admission medications     Medication Sig Start Date End Date Taking? Authorizing Provider  aspirin EC 81 MG tablet Take 81 mg by mouth daily.    [provider]  diazepam (VALIUM) 5 MG tablet Take 5 mg by mouth every 6 (six) hours as needed for anxiety.    [provider]  DULoxetine (CYMBALTA) 60 MG capsule Take 60 mg by mouth daily.    [provider]  ezetimibe (ZETIA) 10 MG tablet Take 10 mg by mouth daily. 10/23/17   [provider]  fish oil-omega-3 fatty acids 1000 MG capsule Take 1 g by mouth daily.    [provider]  magnesium 30 MG tablet Take 30 mg by mouth 2 (two) times daily.    [provider]  milk thistle 175 MG tablet Take 175 mg by mouth daily.    [provider]  ondansetron (ZOFRAN-ODT) 8 MG disintegrating tablet  12/21/17   [provider]  OVER THE COUNTER MEDICATION Take by mouth daily. Tumeric    [provider]  OVER THE COUNTER MEDICATION Cinnamon daily    [provider]  OVER THE COUNTER MEDICATION alteril natural sleep  aide at bedtime    [provider]  Oxycodone HCl 10 MG TABS TAKE 1/2 TO 1 TABLET BY MOUTH EVERY 6 HOURS, TO BE TAKEN AS PRESCRIBED DNF UNTIL 11/23/17 11/23/17   [provider]  oxyCODONE-acetaminophen (PERCOCET/ROXICET) 5-325 MG tablet Take by mouth. 11/07/12   [provider]  pravastatin (PRAVACHOL) 20 MG tablet Take 20 mg by mouth at bedtime. 11/28/17   [provider]  PRAVASTATIN SODIUM PO Take by mouth daily. Pt unsure of dosage 06-01-2016    [provider]  vitamin C (ASCORBIC ACID) 500 MG tablet Take 500 mg by mouth daily.    [provider]  zolpidem (AMBIEN CR) 6.25 MG CR tablet TAKE 1 TABLET BY MOUTH AT BEDTIME FOR insomnia 12/20/17   [provider]    Family History Family History  Problem Relation Age of Onset  . Cancer Maternal Grandmother   . Cancer Maternal Grandfather   . Colon polyps Sister   . Colon polyps  Brother   . Stomach cancer Paternal Grandfather   . Esophageal cancer Neg Hx   . Rectal cancer Neg Hx   . Colon cancer Neg Hx   . Pancreatic cancer Neg Hx     Social History Social History   Tobacco Use  . Smoking status: Former Research scientist (life sciences)  . Smokeless tobacco: Never Used  Substance Use Topics  . Alcohol use: Yes    Comment: Rare  . Drug use: No     Allergies   Sulfa antibiotics   Review of Systems Review of Systems  All systems reviewed and negative, other than as noted in HPI.  Physical Exam Updated Vital Signs BP 136/67 (BP Location: Right Arm)   Pulse 74   Temp 97.8 F (36.6 C) (Oral)   Resp 17   SpO2 96%   Physical Exam  Constitutional: She appears well-developed and well-nourished. No distress.  HENT:  Head: Normocephalic and atraumatic.  Eyes: Conjunctivae are normal. Right eye exhibits no discharge. Left eye exhibits no discharge.  Neck: Neck supple.  Cardiovascular: Normal rate, regular rhythm and normal heart sounds. Exam reveals no gallop and no friction rub.  No murmur heard. Pulmonary/Chest: Effort normal and breath sounds normal. No respiratory distress.  Abdominal: Soft. She exhibits no distension. There is tenderness.  Fused abdominal tenderness but focally worse in epigastrium right upper quadrant.  Voluntary guarding.  No distention.  Musculoskeletal: She exhibits no edema or tenderness.  Neurological: She is alert.  Skin: Skin is warm and dry.  Psychiatric: She has a normal mood and affect. Her behavior is normal. Thought content normal.  Nursing note and vitals reviewed.    ED Treatments / Results  Labs (all labs ordered are listed, but only abnormal results are displayed) Labs Reviewed  COMPREHENSIVE METABOLIC PANEL - Abnormal; Notable for the following components:      Result Value   AST 13 (*)    All other components within normal limits  CBC WITH DIFFERENTIAL/PLATELET  LIPASE, BLOOD    EKG None  Radiology Ct Abdomen Pelvis  W Contrast  Result Date: 12/21/2017 CLINICAL DATA:  62 year old female with a history of right upper quadrant pain and nausea EXAM: CT ABDOMEN AND PELVIS WITH CONTRAST TECHNIQUE: Multidetector CT imaging of the abdomen and pelvis was performed using the standard protocol following bolus administration of intravenous contrast. CONTRAST:  136mL ISOVUE-300 IOPAMIDOL (ISOVUE-300) INJECTION 61% COMPARISON:  None. FINDINGS: Lower chest: No acute finding Hepatobiliary: Unremarkable appearance of liver. Unremarkable gallbladder with no gallbladder  wall thickening, pericholecystic fluid or inflammatory changes, and no radiopaque cholelithiasis. Unremarkable appearance of the intrahepatic and extrahepatic biliary ductal system. Pancreas: Unremarkable pancreas Spleen: Unremarkable spleen Adrenals/Urinary Tract: Bilateral adrenal glands demonstrate low-density nodules. On the right, a nodule measures 10 mm. On the left nodule measures 13 mm. Bilateral kidneys within normal limits with no hydronephrosis or nephrolithiasis. Unremarkable course the bilateral ureters. Unremarkable urinary bladder. Stomach/Bowel: Unremarkable stomach. Unremarkable small bowel. No abnormal distention. No transition point. No focal inflammatory changes. Normal appendix. Moderate stool burden. No focal inflammatory changes. Vascular/Lymphatic: Atherosclerotic changes of the abdominal aorta. Irregular plaque/ulceration of the infrarenal abdominal aorta at 2 location just below the renal arteries. This results in a greatest aortic diameter of 2.2 cm. No periaortic fluid or inflammatory changes. Bilateral iliac arteries and proximal femoral arteries patent. No lymphadenopathy. Reproductive: Hysterectomy. Other: Small fat containing umbilical hernia. Musculoskeletal: Degenerative changes of the thoracolumbar spine. No bony canal narrowing. No acute displaced fracture. Scoliotic curvature of the visualized thoracolumbar spine. IMPRESSION: No acute CT  finding to account for abdominal pain. Incidental imaging of bilateral adrenal nodules. Although these most likely represent benign adrenal adenoma, the current CT is not diagnostic. If further imaging is warranted, consider a adrenal protocol MRI. Aortic atherosclerosis including focal irregular plaque of the infrarenal abdominal aorta. Aortic Atherosclerosis (ICD10-I70.0). Electronically Signed   By: Corrie Mckusick D.O.   On: 12/21/2017 16:46   US Abdomen Limited  Result Date: 12/21/2017 CLINICAL DATA:  RIGHT UPPER QUADRANT pain. EXAM: ULTRASOUND ABDOMEN LIMITED RIGHT UPPER QUADRANT COMPARISON:  None. FINDINGS: Gallbladder: Gallbladder has a normal appearance. Gallbladder wall is 1.2 millimeters, within normal limits. No stones or pericholecystic fluid. No sonographic Murphy's sign. Common bile duct: Diameter: 6.2 millimeters Liver: No focal lesion identified. Within normal limits in parenchymal echogenicity. Portal vein is patent on color Doppler imaging with normal direction of blood flow towards the liver. IMPRESSION: Normal evaluation of the RIGHT UPPER QUADRANT. Electronically Signed   By: Nolon Nations M.D.   On: 12/21/2017 15:15    Procedures Procedures (including critical care time)  Medications Ordered in ED Medications  pantoprazole (PROTONIX) 80 mg in sodium chloride 0.9 % 100 mL IVPB (has no administration in time range)  HYDROmorphone (DILAUDID) injection 1 mg (1 mg Intravenous Given 12/21/17 1423)  ondansetron (ZOFRAN) injection 4 mg (4 mg Intravenous Given 12/21/17 1423)  gi cocktail (Maalox,Lidocaine,Donnatal) (30 mLs Oral Given 12/21/17 1423)     Initial Impression / Assessment and Plan / ED Course  I have reviewed the triage vital signs and the nursing notes.  Pertinent labs & imaging results that were available during my care of the patient were reviewed by me and considered in my medical decision making (see chart for details).    62 year old female with what I suspect  is a peptic ulcer.  She reports long-standing NSAID usage for chronic back pain.  Location of symptoms and her description is pretty typical.  Consider symptomatic cholelithiasis, but I feel this is less likely.  Consider pancreatitis but no readily apparent risk factors..  Doubt AAA.  Will start with a right upper quadrant ultrasound.  Labs.  Symptomatic treatment.  [place] normal. Labs fairly normal. She is feeling better although still pretty tender on exam. No distension. I suspect this is PUD which likely won't be visible on CT but will obtain one to r/o perforation given her degree of pain.   CT fine. outpt GI follow-up. Avoid NSAIDs. Acknowledge she is regularly prescribed oxycodone. Addition prescription provided  for a couple days for break through pain. PRN zofran. BID PPI.    Final Clinical Impressions(s) / ED Diagnoses   Final diagnoses:  Pain of upper abdomen    ED Discharge Orders    None       Virgel Manifold, MD 12/21/17 [phone]

## 2017-12-21 NOTE — ED Notes (Signed)
Patient transported to CT, unable to obtain vitals at the moment

## 2017-12-24 DIAGNOSIS — M542 Cervicalgia: Secondary | ICD-10-CM | POA: Diagnosis not present

## 2017-12-24 DIAGNOSIS — R69 Illness, unspecified: Secondary | ICD-10-CM | POA: Diagnosis not present

## 2017-12-31 ENCOUNTER — Ambulatory Visit: Payer: PPO | Admitting: Gastroenterology

## 2018-01-07 ENCOUNTER — Ambulatory Visit: Payer: PPO | Admitting: Gastroenterology

## 2018-01-09 DIAGNOSIS — M545 Low back pain: Secondary | ICD-10-CM | POA: Diagnosis not present

## 2018-01-09 DIAGNOSIS — M5441 Lumbago with sciatica, right side: Secondary | ICD-10-CM | POA: Diagnosis not present

## 2018-01-09 DIAGNOSIS — R030 Elevated blood-pressure reading, without diagnosis of hypertension: Secondary | ICD-10-CM | POA: Diagnosis not present

## 2018-01-09 DIAGNOSIS — M542 Cervicalgia: Secondary | ICD-10-CM | POA: Diagnosis not present

## 2018-01-14 ENCOUNTER — Ambulatory Visit (INDEPENDENT_AMBULATORY_CARE_PROVIDER_SITE_OTHER): Payer: PPO | Admitting: Gastroenterology

## 2018-01-14 VITALS — BP 100/70 | HR 78 | Ht 66.0 in | Wt 144.0 lb

## 2018-01-14 DIAGNOSIS — R1013 Epigastric pain: Secondary | ICD-10-CM

## 2018-01-14 DIAGNOSIS — Z791 Long term (current) use of non-steroidal anti-inflammatories (NSAID): Secondary | ICD-10-CM

## 2018-01-14 MED ORDER — AMBULATORY NON FORMULARY MEDICATION: 1 refills | Status: DC

## 2018-01-14 NOTE — Progress Notes (Signed)
01/14/2018 Deborah Peters [id number] 1955/09/03   HISTORY OF PRESENT ILLNESS:  This is a 62 year old female who is a patient of Dr. Woodward Ku.  She presents here today at the request of her PCP, Dr. Joylene Draft, for evaluation regarding epigastric abdominal pain.  This began at the end of September.  It was very severe constant pain.  She ended up in the emergency department where CT scan of the abdomen pelvis with contrast, labs, and ultrasound were all unremarkable.  She had great relief with GI cocktail.  She tells me that she does have a history of reflux and was only using Prilosec on an as-needed basis.  Now she is on pantoprazole 40 mg twice daily.  She also tells me that she was using Aleve twice a day for quite some time to help with her back pain.  She has now been off of that medication.  She has had improvement in her symptoms but is still had experienced 3 bad "episodes" of pain again.  She says that it remains constant but now becomes more severe at times.  Reports constant low grade nausea as well.   Past Medical History:  Diagnosis Date  . Allergy   . Chronic back pain   . Constipation due to opioid therapy   . DDD (degenerative disc disease), lumbar   . Depression   . Diabetes mellitus, type 2 ([place])   . Fatty liver   . GERD (gastroesophageal reflux disease)    past hx of   . Gestational diabetes    no meds as of 06-01-2016.  diet controlled  . Heart murmur   . Heart murmur   . Hyperlipidemia   . Hypertension    pt this pain related - id occasionally high   . IFG (impaired fasting glucose)   . Meralgia paraesthetica   . Migraines   . Post-menopausal   . Scoliosis   . Ulcer    years ago. gastric ulcer.   Past Surgical History:  Procedure Laterality Date  . ABDOMINAL HYSTERECTOMY     has her ovaries  . BACK SURGERY  2011   L5 S1  . COLONOSCOPY    . JOINT REPLACEMENT     L knee  . knee surgeries     x 8 before replacement   . POLYPECTOMY     HPP in 2008 DB      reports that she has quit smoking. She has never used smokeless tobacco. She reports that she drinks alcohol. She reports that she does not use drugs. family history includes Cancer in her maternal grandfather and maternal grandmother; Colon polyps in her brother and sister; Stomach cancer in her paternal grandfather. Allergies  Allergen Reactions  . Sulfa Antibiotics Anaphylaxis      Outpatient Encounter Medications as of 01/14/2018  Medication Sig  . aspirin EC 81 MG tablet Take 81 mg by mouth daily.  . diazepam (VALIUM) 5 MG tablet Take 5 mg by mouth every 6 (six) hours as needed for anxiety or muscle spasms.   . DULoxetine (CYMBALTA) 60 MG capsule Take 60 mg by mouth daily.  Marland Kitchen ezetimibe (ZETIA) 10 MG tablet Take 10 mg by mouth daily.  . fish oil-omega-3 fatty acids 1000 MG capsule Take 2 g by mouth 2 (two) times daily.   . magnesium 30 MG tablet Take 30 mg by mouth 2 (two) times daily.  . milk thistle 175 MG tablet Take 175 mg by mouth daily.  . ondansetron (ZOFRAN-ODT)  8 MG disintegrating tablet Take 8 mg by mouth every 8 (eight) hours as needed for nausea or vomiting.   Marland Kitchen OVER THE COUNTER MEDICATION Take 1 capsule by mouth daily. Tumeric   . OVER THE COUNTER MEDICATION Take 1 tablet by mouth daily. Cinnamon daily   . OVER THE COUNTER MEDICATION Take 1 tablet by mouth at bedtime. alteril natural sleep aide at bedtime   . Oxycodone HCl 10 MG TABS Take 10 mg by mouth every 4 (four) hours as needed (back pain).   . pantoprazole (PROTONIX) 40 MG tablet Take 1 tablet (40 mg total) by mouth 2 (two) times daily before a meal.  . pravastatin (PRAVACHOL) 20 MG tablet Take 20 mg by mouth at bedtime.  Marland Kitchen zolpidem (AMBIEN CR) 6.25 MG CR tablet Take 6.25 mg by mouth at bedtime.   . [DISCONTINUED] HYDROcodone-acetaminophen (NORCO/VICODIN) 5-325 MG tablet Take 1 tablet by mouth every 6 (six) hours as needed.  . [DISCONTINUED] ondansetron (ZOFRAN) 4 MG tablet Take 1 tablet (4 mg total) by mouth  every 8 (eight) hours as needed for nausea or vomiting.  . [DISCONTINUED] vitamin C (ASCORBIC ACID) 500 MG tablet Take 500 mg by mouth daily.  . [DISCONTINUED] 0.9 %  sodium chloride infusion    No facility-administered encounter medications on file as of 01/14/2018.      REVIEW OF SYSTEMS  : All other systems reviewed and negative except where noted in the History of Present Illness.   PHYSICAL EXAM: BP 100/70   Pulse 78   Ht 5\' 6"  (1.676 m)   Wt 144 lb (65.3 kg)   BMI 23.24 kg/m  General: Well developed white female in no acute distress Head: Normocephalic and atraumatic Eyes:  Sclerae anicteric, conjunctiva pink. Ears: Normal auditory acuity Lungs: Clear throughout to auscultation; no increased WOB. Heart: Regular rate and rhythm; no M/R/G. Abdomen: Soft, non-distended.  BS present.  Epigastric TTP. Musculoskeletal: Symmetrical with no gross deformities  Skin: No lesions on visible extremities Extremities: No edema  Neurological: Alert oriented x 4, grossly non-focal  Psychological:  Alert and cooperative. Normal mood and affect  ASSESSMENT AND PLAN: *Epigastric abdominal pain:  Was using NSAID's daily.  CT scan and ultrasound ok as well as labs.  ? Esophagitis vs ulcer.  Will remain off of NSAID's.  Will continue pantoprazole 40 mg BID for now.  Had great relief with GI cocktail.  Will prescribe to use prn for now.  Will plan for EGD with Dr. Silverio Decamp.  **The risks, benefits, and alternatives to EGD were discussed with the patient and she consents to proceed.    CC:  Crist Infante, MD

## 2018-01-14 NOTE — Patient Instructions (Addendum)
If you are age 62 or older, your body mass index should be between 23-30. Your Body mass index is 23.24 kg/m. If this is out of the aforementioned range listed, please consider follow up with your Primary Care Provider.  If you are age 8 or younger, your body mass index should be between 19-25. Your Body mass index is 23.24 kg/m. If this is out of the aformentioned range listed, please consider follow up with your Primary Care Provider.   You have been scheduled for an endoscopy. Please follow written instructions given to you at your visit today. If you use inhalers (even only as needed), please bring them with you on the day of your procedure. Your physician has requested that you go to www.startemmi.com and enter the access code given to you at your visit today. This web site gives a general overview about your procedure. However, you should still follow specific instructions given to you by our office regarding your preparation for the procedure.   We have sent the following medications to your pharmacy for you to pick up at your convenience: GI Cocktail.   It was a pleasure to see you today!

## 2018-01-22 ENCOUNTER — Encounter: Payer: Self-pay | Admitting: *Deleted

## 2018-01-23 DIAGNOSIS — M542 Cervicalgia: Secondary | ICD-10-CM | POA: Diagnosis not present

## 2018-01-24 ENCOUNTER — Encounter: Payer: Self-pay | Admitting: Gastroenterology

## 2018-01-24 ENCOUNTER — Ambulatory Visit (AMBULATORY_SURGERY_CENTER): Payer: PPO | Admitting: Gastroenterology

## 2018-01-24 VITALS — BP 138/57 | HR 61 | Temp 98.9°F | Resp 21 | Ht 66.0 in | Wt 144.0 lb

## 2018-01-24 DIAGNOSIS — K2980 Duodenitis without bleeding: Secondary | ICD-10-CM

## 2018-01-24 DIAGNOSIS — K319 Disease of stomach and duodenum, unspecified: Secondary | ICD-10-CM | POA: Diagnosis not present

## 2018-01-24 DIAGNOSIS — K3189 Other diseases of stomach and duodenum: Secondary | ICD-10-CM | POA: Diagnosis not present

## 2018-01-24 DIAGNOSIS — R1013 Epigastric pain: Secondary | ICD-10-CM

## 2018-01-24 DIAGNOSIS — K269 Duodenal ulcer, unspecified as acute or chronic, without hemorrhage or perforation: Secondary | ICD-10-CM | POA: Diagnosis not present

## 2018-01-24 MED ORDER — SODIUM CHLORIDE 0.9 % IV SOLN: 500.0000 mL | Freq: Once | INTRAVENOUS | Status: DC

## 2018-01-24 MED ORDER — ONDANSETRON HCL 4 MG PO TABS: 4.0000 mg | ORAL_TABLET | Freq: Three times a day (TID) | ORAL | 0 refills | Status: DC | PRN

## 2018-01-24 MED ORDER — PANTOPRAZOLE SODIUM 40 MG PO TBEC: 40.0000 mg | DELAYED_RELEASE_TABLET | Freq: Two times a day (BID) | ORAL | 0 refills | Status: DC

## 2018-01-24 MED ORDER — SUCRALFATE 1 G PO TABS: 1.0000 g | ORAL_TABLET | Freq: Two times a day (BID) | ORAL | 0 refills | Status: DC

## 2018-01-24 NOTE — Patient Instructions (Signed)
YOU HAD AN ENDOSCOPIC PROCEDURE TODAY AT [facility]:   Refer to the procedure report that was given to you for any specific questions about what was found during the examination.  If the procedure report does not answer your questions, please call your gastroenterologist to clarify.  If you requested that your care partner not be given the details of your procedure findings, then the procedure report has been included in a sealed envelope for you to review at your convenience later.  YOU SHOULD EXPECT: Some feelings of bloating in the abdomen. Passage of more gas than usual.  Walking can help get rid of the air that was put into your GI tract during the procedure and reduce the bloating.   Please Note:  You might notice some irritation and congestion in your nose or some drainage.  This is from the oxygen used during your procedure.  There is no need for concern and it should clear up in a day or so.  SYMPTOMS TO REPORT IMMEDIATELY:    Following upper endoscopy (EGD)  Vomiting of blood or coffee ground material  New chest pain or pain under the shoulder blades  Painful or persistently difficult swallowing  New shortness of breath  Fever of 100F or higher  Black, tarry-looking stools  For urgent or emergent issues, a gastroenterologist can be reached at any hour by calling [phone].   DIET:  We do recommend a small meal at first, but then you may proceed to your regular diet.  Drink plenty of fluids but you should avoid alcoholic beverages for 24 hours.  ACTIVITY:  You should plan to take it easy for the rest of today and you should NOT DRIVE or use heavy machinery until tomorrow (because of the sedation medicines used during the test).    FOLLOW UP: Our staff will call the number listed on your records the next business day following your procedure to check on you and address any questions or concerns that you may have regarding the information given to you  following your procedure. If we do not reach you, we will leave a message.  However, if you are feeling well and you are not experiencing any problems, there is no need to return our call.  We will assume that you have returned to your regular daily activities without incident.  If any biopsies were taken you will be contacted by phone or by letter within the next 1-3 weeks.  Please call us at [phone] if you have not heard about the biopsies in 3 weeks.    SIGNATURES/CONFIDENTIALITY: You and/or your care partner have signed paperwork which will be entered into your electronic medical record.  These signatures attest to the fact that that the information above on your After Visit Summary has been reviewed and is understood.  Full responsibility of the confidentiality of this discharge information lies with you and/or your care-partner.  No NSAIDS until further notice per Dr Silverio Decamp.  Take you medications as drected.  Read all handouts given to you by your recovery room nurse.

## 2018-01-24 NOTE — Progress Notes (Signed)
Called to room to assist during endoscopic procedure.  Patient ID and intended procedure confirmed with present staff. Received instructions for my participation in the procedure from the performing physician.  

## 2018-01-24 NOTE — Progress Notes (Signed)
Pt's states no medical or surgical changes since previsit or office visit.  No egg or soy allergy  

## 2018-01-24 NOTE — Progress Notes (Signed)
A and O x3. Report to RN. Tolerated MAC anesthesia well.Teeth unchanged after procedure.

## 2018-01-24 NOTE — Op Note (Addendum)
[facility] Patient Name: Deborah Peters Procedure Date: 01/24/2018 8:02 AM MRN: [id number] Endoscopist: Mauri Pole , MD Age: 62 Referring MD:  Date of Birth: October 27, 1955 Gender: Female Account #: [id number] Procedure:                Upper GI endoscopy Indications:              Epigastric abdominal pain Medicines:                Monitored Anesthesia Care Procedure:                Pre-Anesthesia Assessment:                           - Prior to the procedure, a History and Physical                            was performed, and patient medications and                            allergies were reviewed. The patient's tolerance of                            previous anesthesia was also reviewed. The risks                            and benefits of the procedure and the sedation                            options and risks were discussed with the patient.                            All questions were answered, and informed consent                            was obtained. Prior Anticoagulants: The patient has                            taken no previous anticoagulant or antiplatelet                            agents. ASA Grade Assessment: II - A patient with                            mild systemic disease. After reviewing the risks                            and benefits, the patient was deemed in                            satisfactory condition to undergo the procedure.                           After obtaining informed consent, the endoscope was  passed under direct vision. Throughout the                            procedure, the patient's blood pressure, pulse, and                            oxygen saturations were monitored continuously. The                            Endoscope was introduced through the mouth, and                            advanced to the second part of duodenum. The upper                            GI endoscopy was  accomplished without difficulty.                            The patient tolerated the procedure well. Scope In: Scope Out: Findings:                 The esophagus was normal.                           The Z-line was regular and was found 36 cm from the                            incisors.                           Patchy mild inflammation characterized by                            congestion (edema), erythema and friability was                            found in the entire examined stomach. Biopsies were                            taken with a cold forceps for Helicobacter pylori                            testing.                           One non-bleeding cratered duodenal ulcer with a                            clean ulcer base (Forrest Class III) was found in                            the duodenal bulb. The lesion was 4 mm in largest                            dimension.  A few localized duodenal inflammation with                            superficial erosions without bleeding were found in                            the second portion of the duodenum. Complications:            No immediate complications. Estimated Blood Loss:     Estimated blood loss was minimal. Impression:               - Normal esophagus.                           - Z-line regular, 36 cm from the incisors.                           - Gastritis. Biopsied.                           - One non-bleeding duodenal ulcer with a clean                            ulcer base (Forrest Class III).                           - Duodenal erosions without bleeding. Recommendation:           - Patient has a contact number available for                            emergencies. The signs and symptoms of potential                            delayed complications were discussed with the                            patient. Return to normal activities tomorrow.                            Written discharge  instructions were provided to the                            patient.                           - Resume previous diet.                           - Continue present medications.                           - Await pathology results.                           - Repeat upper endoscopy in 3 months to document  healing                           -Avoid NSAID's                           - Use Protonix (pantoprazole) 40 mg PO BID for 3                            months.                           - Use sucralfate tablets 1 gram PO BID for 1 month.                           - Zofran 4mg  daily PRN X 30 tablets with no refills Mauri Pole, MD 01/24/2018 8:21:37 AM This report has been signed electronically.

## 2018-01-25 ENCOUNTER — Telehealth: Payer: Self-pay

## 2018-01-25 ENCOUNTER — Telehealth: Payer: Self-pay | Admitting: *Deleted

## 2018-01-25 NOTE — Telephone Encounter (Signed)
No answer, second call.  Left message to call if questions or concerns. 

## 2018-01-25 NOTE — Telephone Encounter (Signed)
  Follow up Call-  Call back number 01/24/2018 06/12/2016  Post procedure Call Back phone  # [phone] hm  Permission to leave phone message Yes Yes  Some recent data might be hidden     Left message

## 2018-02-01 ENCOUNTER — Encounter: Payer: Self-pay | Admitting: Gastroenterology

## 2018-02-04 NOTE — Progress Notes (Signed)
Reviewed and agree with documentation and assessment and plan. K. Veena Jerimey Burridge , MD   

## 2018-02-23 DIAGNOSIS — M542 Cervicalgia: Secondary | ICD-10-CM | POA: Diagnosis not present

## 2018-02-23 DIAGNOSIS — R69 Illness, unspecified: Secondary | ICD-10-CM | POA: Diagnosis not present

## 2018-03-25 DIAGNOSIS — R69 Illness, unspecified: Secondary | ICD-10-CM | POA: Diagnosis not present

## 2018-04-04 DIAGNOSIS — M545 Low back pain: Secondary | ICD-10-CM | POA: Diagnosis not present

## 2018-04-04 DIAGNOSIS — M542 Cervicalgia: Secondary | ICD-10-CM | POA: Diagnosis not present

## 2018-04-26 ENCOUNTER — Encounter: Payer: PPO | Admitting: Gastroenterology

## 2018-05-01 ENCOUNTER — Encounter: Payer: PPO | Admitting: Gastroenterology

## 2018-06-11 ENCOUNTER — Telehealth: Payer: Self-pay | Admitting: *Deleted

## 2018-06-11 NOTE — Telephone Encounter (Signed)
Covid-19 travel screening questions  Have you traveled in the last 14 days? If yes where?  Do you now or have you had a fever in the last 14 days?  Do you have any respiratory symptoms of shortness of breath or cough now or in the last 14 days?  Do you have a medical history of Congestive Heart Failure?  Do you have a medical history of lung disease?  Do you have any family members or close contacts with diagnosed or suspected Covid-19?      Pt is not having any symptoms right now but would like to reschedule to a later date.  Recall put in for 07-26-18

## 2018-06-21 ENCOUNTER — Encounter: Payer: PPO | Admitting: Gastroenterology

## 2018-07-04 DIAGNOSIS — M961 Postlaminectomy syndrome, not elsewhere classified: Secondary | ICD-10-CM | POA: Diagnosis not present

## 2018-07-04 DIAGNOSIS — M159 Polyosteoarthritis, unspecified: Secondary | ICD-10-CM | POA: Diagnosis not present

## 2018-07-04 DIAGNOSIS — Z9181 History of falling: Secondary | ICD-10-CM | POA: Diagnosis not present

## 2018-07-04 DIAGNOSIS — Z6839 Body mass index (BMI) 39.0-39.9, adult: Secondary | ICD-10-CM | POA: Diagnosis not present

## 2018-07-04 DIAGNOSIS — Z79899 Other long term (current) drug therapy: Secondary | ICD-10-CM | POA: Diagnosis not present

## 2018-07-04 DIAGNOSIS — Z139 Encounter for screening, unspecified: Secondary | ICD-10-CM | POA: Diagnosis not present

## 2018-07-04 DIAGNOSIS — J019 Acute sinusitis, unspecified: Secondary | ICD-10-CM | POA: Diagnosis not present

## 2018-07-04 DIAGNOSIS — F5101 Primary insomnia: Secondary | ICD-10-CM | POA: Diagnosis not present

## 2018-07-04 DIAGNOSIS — E669 Obesity, unspecified: Secondary | ICD-10-CM | POA: Diagnosis not present

## 2018-07-04 DIAGNOSIS — E785 Hyperlipidemia, unspecified: Secondary | ICD-10-CM | POA: Diagnosis not present

## 2018-07-04 DIAGNOSIS — M5441 Lumbago with sciatica, right side: Secondary | ICD-10-CM | POA: Diagnosis not present

## 2018-07-04 DIAGNOSIS — F419 Anxiety disorder, unspecified: Secondary | ICD-10-CM | POA: Diagnosis not present

## 2018-07-04 DIAGNOSIS — J449 Chronic obstructive pulmonary disease, unspecified: Secondary | ICD-10-CM | POA: Diagnosis not present

## 2018-07-04 DIAGNOSIS — Z23 Encounter for immunization: Secondary | ICD-10-CM | POA: Diagnosis not present

## 2018-07-08 ENCOUNTER — Other Ambulatory Visit: Payer: Self-pay | Admitting: Neurosurgery

## 2018-07-08 DIAGNOSIS — M961 Postlaminectomy syndrome, not elsewhere classified: Secondary | ICD-10-CM

## 2018-07-24 DIAGNOSIS — R05 Cough: Secondary | ICD-10-CM | POA: Diagnosis not present

## 2018-07-24 DIAGNOSIS — Z20818 Contact with and (suspected) exposure to other bacterial communicable diseases: Secondary | ICD-10-CM | POA: Diagnosis not present

## 2018-07-24 DIAGNOSIS — R509 Fever, unspecified: Secondary | ICD-10-CM | POA: Diagnosis not present

## 2018-07-24 DIAGNOSIS — R110 Nausea: Secondary | ICD-10-CM | POA: Diagnosis not present

## 2018-07-24 DIAGNOSIS — Z9189 Other specified personal risk factors, not elsewhere classified: Secondary | ICD-10-CM | POA: Insufficient documentation

## 2018-07-24 DIAGNOSIS — J309 Allergic rhinitis, unspecified: Secondary | ICD-10-CM | POA: Diagnosis not present

## 2018-08-21 DIAGNOSIS — E119 Type 2 diabetes mellitus without complications: Secondary | ICD-10-CM | POA: Diagnosis not present

## 2018-08-21 DIAGNOSIS — E7849 Other hyperlipidemia: Secondary | ICD-10-CM | POA: Diagnosis not present

## 2018-08-21 DIAGNOSIS — I10 Essential (primary) hypertension: Secondary | ICD-10-CM | POA: Diagnosis not present

## 2018-08-27 DIAGNOSIS — I10 Essential (primary) hypertension: Secondary | ICD-10-CM | POA: Diagnosis not present

## 2018-08-27 DIAGNOSIS — R82998 Other abnormal findings in urine: Secondary | ICD-10-CM | POA: Diagnosis not present

## 2018-08-28 DIAGNOSIS — F329 Major depressive disorder, single episode, unspecified: Secondary | ICD-10-CM | POA: Diagnosis not present

## 2018-08-28 DIAGNOSIS — M5136 Other intervertebral disc degeneration, lumbar region: Secondary | ICD-10-CM | POA: Diagnosis not present

## 2018-08-28 DIAGNOSIS — G4700 Insomnia, unspecified: Secondary | ICD-10-CM | POA: Diagnosis not present

## 2018-08-28 DIAGNOSIS — Z0000 Encounter for general adult medical examination without abnormal findings: Secondary | ICD-10-CM | POA: Diagnosis not present

## 2018-08-28 DIAGNOSIS — Z20818 Contact with and (suspected) exposure to other bacterial communicable diseases: Secondary | ICD-10-CM | POA: Diagnosis not present

## 2018-08-28 DIAGNOSIS — R011 Cardiac murmur, unspecified: Secondary | ICD-10-CM | POA: Diagnosis not present

## 2018-08-28 DIAGNOSIS — R05 Cough: Secondary | ICD-10-CM | POA: Diagnosis not present

## 2018-08-28 DIAGNOSIS — R110 Nausea: Secondary | ICD-10-CM | POA: Diagnosis not present

## 2018-08-28 DIAGNOSIS — R109 Unspecified abdominal pain: Secondary | ICD-10-CM | POA: Diagnosis not present

## 2018-08-28 DIAGNOSIS — J309 Allergic rhinitis, unspecified: Secondary | ICD-10-CM | POA: Diagnosis not present

## 2018-08-28 DIAGNOSIS — E119 Type 2 diabetes mellitus without complications: Secondary | ICD-10-CM | POA: Diagnosis not present

## 2018-08-28 DIAGNOSIS — Z1331 Encounter for screening for depression: Secondary | ICD-10-CM | POA: Diagnosis not present

## 2018-08-28 DIAGNOSIS — K219 Gastro-esophageal reflux disease without esophagitis: Secondary | ICD-10-CM | POA: Diagnosis not present

## 2018-09-05 ENCOUNTER — Other Ambulatory Visit: Payer: Self-pay | Admitting: Internal Medicine

## 2018-09-05 DIAGNOSIS — E785 Hyperlipidemia, unspecified: Secondary | ICD-10-CM

## 2018-09-24 DIAGNOSIS — M542 Cervicalgia: Secondary | ICD-10-CM | POA: Diagnosis not present

## 2018-10-16 DIAGNOSIS — M545 Low back pain: Secondary | ICD-10-CM | POA: Diagnosis not present

## 2018-10-16 DIAGNOSIS — M961 Postlaminectomy syndrome, not elsewhere classified: Secondary | ICD-10-CM | POA: Diagnosis not present

## 2018-10-16 DIAGNOSIS — R030 Elevated blood-pressure reading, without diagnosis of hypertension: Secondary | ICD-10-CM | POA: Diagnosis not present

## 2018-10-16 DIAGNOSIS — M5410 Radiculopathy, site unspecified: Secondary | ICD-10-CM | POA: Diagnosis not present

## 2019-01-20 DIAGNOSIS — M961 Postlaminectomy syndrome, not elsewhere classified: Secondary | ICD-10-CM | POA: Diagnosis not present

## 2019-01-20 DIAGNOSIS — M5410 Radiculopathy, site unspecified: Secondary | ICD-10-CM | POA: Diagnosis not present

## 2019-02-11 DIAGNOSIS — M961 Postlaminectomy syndrome, not elsewhere classified: Secondary | ICD-10-CM | POA: Diagnosis not present

## 2019-02-11 DIAGNOSIS — M5441 Lumbago with sciatica, right side: Secondary | ICD-10-CM | POA: Diagnosis not present

## 2019-02-11 DIAGNOSIS — M545 Low back pain: Secondary | ICD-10-CM | POA: Diagnosis not present

## 2019-02-17 ENCOUNTER — Other Ambulatory Visit: Payer: Self-pay | Admitting: Neurosurgery

## 2019-02-17 DIAGNOSIS — M961 Postlaminectomy syndrome, not elsewhere classified: Secondary | ICD-10-CM

## 2019-02-28 DIAGNOSIS — I10 Essential (primary) hypertension: Secondary | ICD-10-CM | POA: Diagnosis not present

## 2019-02-28 DIAGNOSIS — G4700 Insomnia, unspecified: Secondary | ICD-10-CM | POA: Diagnosis not present

## 2019-02-28 DIAGNOSIS — H9209 Otalgia, unspecified ear: Secondary | ICD-10-CM | POA: Diagnosis not present

## 2019-02-28 DIAGNOSIS — R509 Fever, unspecified: Secondary | ICD-10-CM | POA: Insufficient documentation

## 2019-02-28 DIAGNOSIS — E119 Type 2 diabetes mellitus without complications: Secondary | ICD-10-CM | POA: Diagnosis not present

## 2019-02-28 DIAGNOSIS — R05 Cough: Secondary | ICD-10-CM | POA: Diagnosis not present

## 2019-02-28 DIAGNOSIS — M5136 Other intervertebral disc degeneration, lumbar region: Secondary | ICD-10-CM | POA: Diagnosis not present

## 2019-02-28 DIAGNOSIS — E785 Hyperlipidemia, unspecified: Secondary | ICD-10-CM | POA: Diagnosis not present

## 2019-02-28 DIAGNOSIS — K219 Gastro-esophageal reflux disease without esophagitis: Secondary | ICD-10-CM | POA: Diagnosis not present

## 2019-02-28 DIAGNOSIS — F329 Major depressive disorder, single episode, unspecified: Secondary | ICD-10-CM | POA: Diagnosis not present

## 2019-03-26 DIAGNOSIS — M542 Cervicalgia: Secondary | ICD-10-CM | POA: Diagnosis not present

## 2019-03-28 HISTORY — PX: COLONOSCOPY: SHX174

## 2019-03-31 ENCOUNTER — Other Ambulatory Visit: Payer: PPO

## 2019-04-04 ENCOUNTER — Other Ambulatory Visit: Payer: PPO

## 2019-04-06 ENCOUNTER — Other Ambulatory Visit: Payer: PPO

## 2019-04-13 ENCOUNTER — Other Ambulatory Visit: Payer: PPO

## 2019-04-16 DIAGNOSIS — M961 Postlaminectomy syndrome, not elsewhere classified: Secondary | ICD-10-CM | POA: Diagnosis not present

## 2019-04-16 DIAGNOSIS — M5410 Radiculopathy, site unspecified: Secondary | ICD-10-CM | POA: Diagnosis not present

## 2019-04-26 ENCOUNTER — Other Ambulatory Visit: Payer: PPO

## 2019-05-21 ENCOUNTER — Other Ambulatory Visit: Payer: PPO

## 2019-05-27 ENCOUNTER — Ambulatory Visit
Admission: RE | Admit: 2019-05-27 | Discharge: 2019-05-27 | Disposition: A | Discharge status: Home / Self Care | Payer: PPO | Source: Ambulatory Visit | Attending: Neurosurgery | Admitting: Neurosurgery

## 2019-05-27 ENCOUNTER — Other Ambulatory Visit: Payer: Self-pay

## 2019-05-27 DIAGNOSIS — M545 Low back pain: Secondary | ICD-10-CM | POA: Diagnosis not present

## 2019-05-27 DIAGNOSIS — M961 Postlaminectomy syndrome, not elsewhere classified: Secondary | ICD-10-CM

## 2019-06-09 DIAGNOSIS — M961 Postlaminectomy syndrome, not elsewhere classified: Secondary | ICD-10-CM | POA: Diagnosis not present

## 2019-06-09 DIAGNOSIS — M5416 Radiculopathy, lumbar region: Secondary | ICD-10-CM | POA: Diagnosis not present

## 2019-06-09 DIAGNOSIS — Z6825 Body mass index (BMI) 25.0-25.9, adult: Secondary | ICD-10-CM | POA: Diagnosis not present

## 2019-06-09 DIAGNOSIS — I10 Essential (primary) hypertension: Secondary | ICD-10-CM | POA: Diagnosis not present

## 2019-06-09 DIAGNOSIS — F1120 Opioid dependence, uncomplicated: Secondary | ICD-10-CM | POA: Diagnosis not present

## 2019-07-01 DIAGNOSIS — M5416 Radiculopathy, lumbar region: Secondary | ICD-10-CM | POA: Diagnosis not present

## 2019-07-18 ENCOUNTER — Ambulatory Visit: Payer: PPO

## 2019-07-30 DIAGNOSIS — M5410 Radiculopathy, site unspecified: Secondary | ICD-10-CM | POA: Diagnosis not present

## 2019-07-30 DIAGNOSIS — M5441 Lumbago with sciatica, right side: Secondary | ICD-10-CM | POA: Diagnosis not present

## 2019-07-30 DIAGNOSIS — M961 Postlaminectomy syndrome, not elsewhere classified: Secondary | ICD-10-CM | POA: Diagnosis not present

## 2019-09-24 DIAGNOSIS — M542 Cervicalgia: Secondary | ICD-10-CM | POA: Diagnosis not present

## 2019-09-30 DIAGNOSIS — E7849 Other hyperlipidemia: Secondary | ICD-10-CM | POA: Diagnosis not present

## 2019-09-30 DIAGNOSIS — E119 Type 2 diabetes mellitus without complications: Secondary | ICD-10-CM | POA: Diagnosis not present

## 2019-10-02 DIAGNOSIS — R509 Fever, unspecified: Secondary | ICD-10-CM | POA: Diagnosis not present

## 2019-10-02 DIAGNOSIS — E7849 Other hyperlipidemia: Secondary | ICD-10-CM | POA: Diagnosis not present

## 2019-10-02 DIAGNOSIS — F329 Major depressive disorder, single episode, unspecified: Secondary | ICD-10-CM | POA: Diagnosis not present

## 2019-10-02 DIAGNOSIS — Z0000 Encounter for general adult medical examination without abnormal findings: Secondary | ICD-10-CM | POA: Diagnosis not present

## 2019-10-02 DIAGNOSIS — R109 Unspecified abdominal pain: Secondary | ICD-10-CM | POA: Diagnosis not present

## 2019-10-02 DIAGNOSIS — G4700 Insomnia, unspecified: Secondary | ICD-10-CM | POA: Diagnosis not present

## 2019-10-02 DIAGNOSIS — E1169 Type 2 diabetes mellitus with other specified complication: Secondary | ICD-10-CM | POA: Insufficient documentation

## 2019-10-02 DIAGNOSIS — R809 Proteinuria, unspecified: Secondary | ICD-10-CM | POA: Diagnosis not present

## 2019-10-02 DIAGNOSIS — Z1331 Encounter for screening for depression: Secondary | ICD-10-CM | POA: Diagnosis not present

## 2019-10-02 DIAGNOSIS — I10 Essential (primary) hypertension: Secondary | ICD-10-CM | POA: Diagnosis not present

## 2019-10-02 DIAGNOSIS — M5136 Other intervertebral disc degeneration, lumbar region: Secondary | ICD-10-CM | POA: Diagnosis not present

## 2019-10-02 DIAGNOSIS — R110 Nausea: Secondary | ICD-10-CM | POA: Diagnosis not present

## 2019-10-08 ENCOUNTER — Other Ambulatory Visit: Payer: Self-pay | Admitting: Internal Medicine

## 2019-10-08 DIAGNOSIS — E785 Hyperlipidemia, unspecified: Secondary | ICD-10-CM

## 2019-11-05 ENCOUNTER — Inpatient Hospital Stay: Admission: RE | Admit: 2019-11-05 | Payer: PPO | Source: Ambulatory Visit

## 2019-12-11 DIAGNOSIS — M5416 Radiculopathy, lumbar region: Secondary | ICD-10-CM | POA: Diagnosis not present

## 2019-12-22 DIAGNOSIS — M47816 Spondylosis without myelopathy or radiculopathy, lumbar region: Secondary | ICD-10-CM | POA: Diagnosis not present

## 2019-12-22 DIAGNOSIS — M961 Postlaminectomy syndrome, not elsewhere classified: Secondary | ICD-10-CM | POA: Diagnosis not present

## 2020-01-27 DIAGNOSIS — M47816 Spondylosis without myelopathy or radiculopathy, lumbar region: Secondary | ICD-10-CM | POA: Diagnosis not present

## 2020-02-17 DIAGNOSIS — F1120 Opioid dependence, uncomplicated: Secondary | ICD-10-CM | POA: Diagnosis not present

## 2020-02-17 DIAGNOSIS — M47816 Spondylosis without myelopathy or radiculopathy, lumbar region: Secondary | ICD-10-CM | POA: Diagnosis not present

## 2020-02-17 DIAGNOSIS — M961 Postlaminectomy syndrome, not elsewhere classified: Secondary | ICD-10-CM | POA: Diagnosis not present

## 2020-03-04 ENCOUNTER — Other Ambulatory Visit: Payer: Self-pay | Admitting: Neurosurgery

## 2020-03-04 DIAGNOSIS — M961 Postlaminectomy syndrome, not elsewhere classified: Secondary | ICD-10-CM

## 2020-03-13 ENCOUNTER — Emergency Department (HOSPITAL_BASED_OUTPATIENT_CLINIC_OR_DEPARTMENT_OTHER)
Admission: EM | Admit: 2020-03-13 | Discharge: 2020-03-14 | Disposition: A | Discharge status: Home / Self Care | Payer: PPO | Attending: Emergency Medicine | Admitting: Emergency Medicine

## 2020-03-13 ENCOUNTER — Emergency Department (HOSPITAL_BASED_OUTPATIENT_CLINIC_OR_DEPARTMENT_OTHER): Payer: PPO

## 2020-03-13 ENCOUNTER — Encounter (HOSPITAL_BASED_OUTPATIENT_CLINIC_OR_DEPARTMENT_OTHER): Payer: Self-pay | Admitting: Emergency Medicine

## 2020-03-13 DIAGNOSIS — Z96652 Presence of left artificial knee joint: Secondary | ICD-10-CM | POA: Diagnosis not present

## 2020-03-13 DIAGNOSIS — E119 Type 2 diabetes mellitus without complications: Secondary | ICD-10-CM | POA: Insufficient documentation

## 2020-03-13 DIAGNOSIS — Z87891 Personal history of nicotine dependence: Secondary | ICD-10-CM | POA: Insufficient documentation

## 2020-03-13 DIAGNOSIS — R531 Weakness: Secondary | ICD-10-CM | POA: Diagnosis not present

## 2020-03-13 DIAGNOSIS — R202 Paresthesia of skin: Secondary | ICD-10-CM | POA: Diagnosis present

## 2020-03-13 DIAGNOSIS — I10 Essential (primary) hypertension: Secondary | ICD-10-CM | POA: Insufficient documentation

## 2020-03-13 DIAGNOSIS — Z7982 Long term (current) use of aspirin: Secondary | ICD-10-CM | POA: Diagnosis not present

## 2020-03-13 LAB — CBC WITH DIFFERENTIAL/PLATELET
Abs Immature Granulocytes: 0.02 10*3/uL (ref 0.00–0.07)
Basophils Absolute: 0 10*3/uL (ref 0.0–0.1)
Basophils Relative: 0 %
Eosinophils Absolute: 0.1 10*3/uL (ref 0.0–0.5)
Eosinophils Relative: 1 %
HCT: 35.6 % — ABNORMAL LOW (ref 36.0–46.0)
Hemoglobin: 12.2 g/dL (ref 12.0–15.0)
Immature Granulocytes: 0 %
Lymphocytes Relative: 42 %
Lymphs Abs: 3.8 10*3/uL (ref 0.7–4.0)
MCH: 32.6 pg (ref 26.0–34.0)
MCHC: 34.3 g/dL (ref 30.0–36.0)
MCV: 95.2 fL (ref 80.0–100.0)
Monocytes Absolute: 0.5 10*3/uL (ref 0.1–1.0)
Monocytes Relative: 5 %
Neutro Abs: 4.8 10*3/uL (ref 1.7–7.7)
Neutrophils Relative %: 52 %
Platelets: 231 10*3/uL (ref 150–400)
RBC: 3.74 MIL/uL — ABNORMAL LOW (ref 3.87–5.11)
RDW: 13.1 % (ref 11.5–15.5)
WBC: 9.1 10*3/uL (ref 4.0–10.5)
nRBC: 0 % (ref 0.0–0.2)

## 2020-03-13 LAB — BASIC METABOLIC PANEL
Anion gap: 8 (ref 5–15)
BUN: 8 mg/dL (ref 8–23)
CO2: 25 mmol/L (ref 22–32)
Calcium: 9.1 mg/dL (ref 8.9–10.3)
Chloride: 106 mmol/L (ref 98–111)
Creatinine, Ser: 0.53 mg/dL (ref 0.44–1.00)
GFR, Estimated: >60 mL/min (ref >60)
Glucose, Bld: 115 mg/dL — ABNORMAL HIGH (ref 70–99)
Potassium: 3.6 mmol/L (ref 3.5–5.1)
Sodium: 139 mmol/L (ref 135–145)

## 2020-03-13 MED ORDER — DIAZEPAM 5 MG PO TABS: 5.0000 mg | ORAL_TABLET | Freq: Once | ORAL | Status: DC | PRN

## 2020-03-13 MED ORDER — LORAZEPAM 2 MG/ML IJ SOLN: 1.0000 mg | INTRAMUSCULAR | Status: DC | PRN

## 2020-03-13 NOTE — ED Triage Notes (Signed)
Chronic back pain. Is treated by pain management. She had numbness to her "entire right side" that started Wednesday. States the numbness has improved some. She called her neurosurgeon who suggested she come to the ED

## 2020-03-13 NOTE — Discharge Instructions (Signed)
Go directly to [facility] [address] and enter through the Emergency Department Entrance.

## 2020-03-13 NOTE — ED Notes (Signed)
Mobile MR tech spoke w/ EDP concerning MRI orders -- due to pt's need to be fully sedated, mobile MR cannot accommodate fully sedated patients.  EDP advised that she will transfer pt.

## 2020-03-13 NOTE — ED Notes (Signed)
Pt advised she was leaving due to long wait for an MRI.  Pt stated that she was leaving and would not wait any loner

## 2020-03-13 NOTE — ED Provider Notes (Signed)
[facility] EMERGENCY DEPARTMENT Provider Note   CSN: [id number] Arrival date & time: 03/13/20  1433     History Chief Complaint  Patient presents with  . Numbness  . Back Pain    Deborah Peters is a 64 y.o. female.  HPI   64 year old female with a history of allergies, chronic back pain, degenerative disc disease, depression, diabetes, fatty liver, GERD, heart murmur, hyperlipidemia, hypertension, impaired fasting glucose, meralgia paresthetica, migraines, scoliosis, who presents to the emergency department today for evaluation of numbness.  Patient states she woke up 4 days ago with numbness to the right side of her face, right arm and right leg.  She also noticed some weakness in the right arm and leg and had to drag her foot when walking.  Her symptoms have improved somewhat since onset but have not completely resolved.  She denies any associated vision changes, slurred speech, difficulty with word finding.  She does have contact headaches that she states they are no worse than normal.  She reports a history of chronic back pain and states that normally it is located more so on one side of the back up and down the middle however over the last few weeks she has had pain down the middle of the lumbar spine.  She called her neurosurgeon and her PCP who both advised her to come to the emergency department for further evaluation.  Past Medical History:  Diagnosis Date  . Allergy   . Chronic back pain   . Constipation due to opioid therapy   . DDD (degenerative disc disease), lumbar   . Depression   . Diabetes mellitus, type 2 ([place])   . Fatty liver   . GERD (gastroesophageal reflux disease)    past hx of   . Gestational diabetes    no meds as of 06-01-2016.  diet controlled  . Heart murmur   . Heart murmur   . Hyperlipidemia   . Hypertension    pt this pain related - id occasionally high   . IFG (impaired fasting glucose)   . Meralgia paraesthetica   . Migraines   .  Post-menopausal   . Scoliosis   . Ulcer    years ago. gastric ulcer.    Patient Active Problem List   Diagnosis Date Noted  . Abdominal pain, epigastric 01/14/2018  . NSAID long-term use 01/14/2018  . Knee MCL sprain 10/07/2012  . Right ankle sprain 10/07/2012    Past Surgical History:  Procedure Laterality Date  . ABDOMINAL HYSTERECTOMY     has her ovaries  . BACK SURGERY  2011   L5 S1  . COLONOSCOPY    . JOINT REPLACEMENT     L knee  . knee surgeries     x 8 before replacement   . POLYPECTOMY     HPP in 2008 DB     OB History   No obstetric history on file.     Family History  Problem Relation Age of Onset  . Cancer Maternal Grandmother   . Cancer Maternal Grandfather   . Colon polyps Sister   . Colon polyps Brother   . Stomach cancer Paternal Grandfather   . Esophageal cancer Neg Hx   . Rectal cancer Neg Hx   . Colon cancer Neg Hx   . Pancreatic cancer Neg Hx     Social History   Tobacco Use  . Smoking status: Former Research scientist (life sciences)  . Smokeless tobacco: Never Used  Vaping Use  .  Vaping Use: Never used  Substance Use Topics  . Alcohol use: Not Currently    Comment: Rare  . Drug use: No    Home Medications Prior to Admission medications   Medication Sig Start Date End Date Taking? Authorizing Provider  AMBULATORY NON FORMULARY MEDICATION GI cocktail - 54ml 2 % lidocaine, 86ml  (9mh/5ml) dicyclomine, 270 Maalox Take 5- 66ml  2-3 x a day as needed. 01/14/18   Zehr, Laban Emperor, PA-C  aspirin EC 81 MG tablet Take 81 mg by mouth daily.    [provider]  diazepam (VALIUM) 5 MG tablet Take 5 mg by mouth every 6 (six) hours as needed for anxiety or muscle spasms.     [provider]  DULoxetine (CYMBALTA) 60 MG capsule Take 60 mg by mouth daily.    [provider]  ezetimibe (ZETIA) 10 MG tablet Take 10 mg by mouth daily. 10/23/17   [provider]  fish oil-omega-3 fatty acids 1000 MG capsule Take 2 g by mouth 2 (two) times  daily.     [provider]  magnesium 30 MG tablet Take 30 mg by mouth 2 (two) times daily.    [provider]  milk thistle 175 MG tablet Take 175 mg by mouth daily.    [provider]  ondansetron (ZOFRAN) 4 MG tablet Take 1 tablet (4 mg total) by mouth every 8 (eight) hours as needed for nausea or vomiting. 01/24/18   Mauri Pole, MD  OVER THE COUNTER MEDICATION Take 1 capsule by mouth daily. Tumeric     [provider]  OVER THE COUNTER MEDICATION Take 1 tablet by mouth daily. Cinnamon daily     [provider]  OVER THE COUNTER MEDICATION Take 1 tablet by mouth at bedtime. alteril natural sleep aide at bedtime     [provider]  Oxycodone HCl 10 MG TABS Take 10 mg by mouth every 4 (four) hours as needed (back pain).  11/23/17   [provider]  pantoprazole (PROTONIX) 40 MG tablet Take 1 tablet (40 mg total) by mouth 2 (two) times daily. 01/24/18   Mauri Pole, MD  pravastatin (PRAVACHOL) 20 MG tablet Take 20 mg by mouth at bedtime. 11/28/17   [provider]  sucralfate (CARAFATE) 1 g tablet Take 1 tablet (1 g total) by mouth 2 (two) times daily. 01/24/18   Mauri Pole, MD  zolpidem (AMBIEN CR) 6.25 MG CR tablet Take 6.25 mg by mouth at bedtime.  12/20/17   [provider]    Allergies    Sulfa antibiotics  Review of Systems   Review of Systems  Constitutional: Negative for fever.  HENT: Negative for ear pain and sore throat.   Eyes: Negative for visual disturbance.  Respiratory: Negative for cough and shortness of breath.   Cardiovascular: Negative for chest pain.  Gastrointestinal: Negative for abdominal pain, constipation, diarrhea, nausea and vomiting.  Genitourinary: Negative for dysuria and hematuria.       No loss of control of bowel or bladder function  Musculoskeletal: Positive for back pain.  Skin: Negative for rash.  Neurological: Positive for weakness, numbness and  headaches (chronic).  All other systems reviewed and are negative.   Physical Exam Updated Vital Signs BP (!) 118/58 (BP Location: Right Arm)   Pulse 75   Temp 98 F (36.7 C) (Oral)   Resp 17   Ht 5\' 6"  (1.676 m)   Wt 70.3 kg   SpO2 98%  BMI 25.02 kg/m   Physical Exam Vitals and nursing note reviewed.  Constitutional:      General: She is not in acute distress.    Appearance: She is well-developed and well-nourished.  HENT:     Head: Normocephalic and atraumatic.  Eyes:     Conjunctiva/sclera: Conjunctivae normal.  Cardiovascular:     Rate and Rhythm: Normal rate and regular rhythm.     Heart sounds: No murmur heard.   Pulmonary:     Effort: Pulmonary effort is normal. No respiratory distress.     Breath sounds: Normal breath sounds.  Abdominal:     Palpations: Abdomen is soft.     Tenderness: There is no abdominal tenderness.  Musculoskeletal:        General: No edema.     Cervical back: Neck supple.     Comments: TTP noted to the lumbar spine.   Skin:    General: Skin is warm and dry.  Neurological:     Mental Status: She is alert.     Comments: Mental Status:  Alert, thought content appropriate, able to give a coherent history. Speech fluent without evidence of aphasia. Able to follow 2 step commands without difficulty.  Cranial Nerves:  II: pupils equal, round, reactive to light III,IV, VI: ptosis not present, extra-ocular motions intact bilaterally  V,VII: smile symmetric, decreased sensation to the right side of the face VIII: hearing grossly normal to voice  X: uvula elevates symmetrically  XI: bilateral shoulder shrug symmetric and strong XII: midline tongue extension without fassiculations Motor:  Normal tone. 4/5 strength to the RUE, 2/5 strength to the RLE Sensory: decreased sensation to the RUE/RLE Gait: able to ambulate but somewhat unsteady +RLE drift  Psychiatric:        Mood and Affect: Mood and affect normal.     ED Results /  Procedures / Treatments   Labs (all labs ordered are listed, but only abnormal results are displayed) Labs Reviewed  CBC WITH DIFFERENTIAL/PLATELET - Abnormal; Notable for the following components:      Result Value   RBC 3.74 (*)    HCT 35.6 (*)    All other components within normal limits  BASIC METABOLIC PANEL    EKG None  Radiology No results found.  Procedures Procedures (including critical care time)  Medications Ordered in ED Medications  diazepam (VALIUM) tablet 5 mg (has no administration in time range)    ED Course  I have reviewed the triage vital signs and the nursing notes.  Pertinent labs & imaging results that were available during my care of the patient were reviewed by me and considered in my medical decision making (see chart for details).    MDM Rules/Calculators/A&P                          64 year old female presenting to the emergency department today for evaluation of right-sided weakness/numbness that started 4 days ago.  On exam, patient has decreased sensation of the right side of the face, right upper extremity and right leg with significant weakness noted to the right lower extremity with a positive drift.  Pt with exam concerning for possible stroke with additional concern for possible spinal process given her increased back pain and new focal weakness.   MRI not available to be completed at [facility] at this time therefore patient will need to be transferred to [facility]   5:30 PM CONSULT with Dr. Langston Masker who accepts  patient for transfer.   Final Clinical Impression(s) / ED Diagnoses Final diagnoses:  Right sided weakness    Rx / DC Orders ED Discharge Orders    None       Rodney Booze, PA-C 03/13/20 1742    Malvin Johns, MD 03/13/20 1753

## 2020-03-13 NOTE — Progress Notes (Signed)
Attempted to get pt for MRI, pt needs sedation triage Rn and charge nurse are both busy at this time and unable to get meds to pt for MRI.

## 2020-03-15 ENCOUNTER — Other Ambulatory Visit: Payer: Self-pay | Admitting: Internal Medicine

## 2020-03-15 DIAGNOSIS — R29818 Other symptoms and signs involving the nervous system: Secondary | ICD-10-CM

## 2020-03-16 ENCOUNTER — Ambulatory Visit (HOSPITAL_COMMUNITY)
Admission: RE | Admit: 2020-03-16 | Discharge: 2020-03-16 | Disposition: A | Discharge status: Home / Self Care | Payer: PPO | Source: Ambulatory Visit | Attending: Internal Medicine | Admitting: Internal Medicine

## 2020-03-16 DIAGNOSIS — R42 Dizziness and giddiness: Secondary | ICD-10-CM | POA: Diagnosis not present

## 2020-03-16 DIAGNOSIS — R29818 Other symptoms and signs involving the nervous system: Secondary | ICD-10-CM | POA: Insufficient documentation

## 2020-03-17 ENCOUNTER — Encounter (HOSPITAL_COMMUNITY): Payer: Self-pay | Admitting: Emergency Medicine

## 2020-03-17 ENCOUNTER — Observation Stay (HOSPITAL_COMMUNITY): Payer: PPO

## 2020-03-17 ENCOUNTER — Other Ambulatory Visit: Payer: Self-pay

## 2020-03-17 ENCOUNTER — Observation Stay (HOSPITAL_COMMUNITY)
Admission: EM | Admit: 2020-03-17 | Discharge: 2020-03-18 | Disposition: A | Discharge status: Home / Self Care | Payer: PPO | Attending: Internal Medicine | Admitting: Internal Medicine

## 2020-03-17 DIAGNOSIS — Z20822 Contact with and (suspected) exposure to covid-19: Secondary | ICD-10-CM | POA: Insufficient documentation

## 2020-03-17 DIAGNOSIS — E119 Type 2 diabetes mellitus without complications: Secondary | ICD-10-CM | POA: Insufficient documentation

## 2020-03-17 DIAGNOSIS — E785 Hyperlipidemia, unspecified: Secondary | ICD-10-CM | POA: Diagnosis present

## 2020-03-17 DIAGNOSIS — I728 Aneurysm of other specified arteries: Secondary | ICD-10-CM | POA: Diagnosis not present

## 2020-03-17 DIAGNOSIS — Z87891 Personal history of nicotine dependence: Secondary | ICD-10-CM | POA: Insufficient documentation

## 2020-03-17 DIAGNOSIS — Z7982 Long term (current) use of aspirin: Secondary | ICD-10-CM | POA: Diagnosis not present

## 2020-03-17 DIAGNOSIS — I10 Essential (primary) hypertension: Secondary | ICD-10-CM | POA: Insufficient documentation

## 2020-03-17 DIAGNOSIS — I639 Cerebral infarction, unspecified: Principal | ICD-10-CM | POA: Diagnosis present

## 2020-03-17 DIAGNOSIS — R202 Paresthesia of skin: Secondary | ICD-10-CM | POA: Diagnosis present

## 2020-03-17 DIAGNOSIS — Z79899 Other long term (current) drug therapy: Secondary | ICD-10-CM | POA: Diagnosis not present

## 2020-03-17 DIAGNOSIS — I671 Cerebral aneurysm, nonruptured: Secondary | ICD-10-CM | POA: Diagnosis not present

## 2020-03-17 DIAGNOSIS — Z96652 Presence of left artificial knee joint: Secondary | ICD-10-CM | POA: Insufficient documentation

## 2020-03-17 LAB — COMPREHENSIVE METABOLIC PANEL
ALT: 16 U/L (ref 0–44)
AST: 16 U/L (ref 15–41)
Albumin: 4.1 g/dL (ref 3.5–5.0)
Alkaline Phosphatase: 53 U/L (ref 38–126)
Anion gap: 10 (ref 5–15)
BUN: 6 mg/dL — ABNORMAL LOW (ref 8–23)
CO2: 25 mmol/L (ref 22–32)
Calcium: 9.5 mg/dL (ref 8.9–10.3)
Chloride: 104 mmol/L (ref 98–111)
Creatinine, Ser: 0.7 mg/dL (ref 0.44–1.00)
GFR, Estimated: >60 mL/min (ref >60)
Glucose, Bld: 90 mg/dL (ref 70–99)
Potassium: 3.9 mmol/L (ref 3.5–5.1)
Sodium: 139 mmol/L (ref 135–145)
Total Bilirubin: 0.7 mg/dL (ref 0.3–1.2)
Total Protein: 6.7 g/dL (ref 6.5–8.1)

## 2020-03-17 LAB — CREATININE, SERUM
Creatinine, Ser: 0.68 mg/dL (ref 0.44–1.00)
GFR, Estimated: >60 mL/min (ref >60)

## 2020-03-17 LAB — RESP PANEL BY RT-PCR (FLU A&B, COVID) ARPGX2
Influenza A by PCR: NEGATIVE
Influenza B by PCR: NEGATIVE
SARS Coronavirus 2 by RT PCR: NEGATIVE

## 2020-03-17 LAB — CBC
HCT: 37.1 % (ref 36.0–46.0)
HCT: 39.4 % (ref 36.0–46.0)
Hemoglobin: 12.5 g/dL (ref 12.0–15.0)
Hemoglobin: 12.8 g/dL (ref 12.0–15.0)
MCH: 32.6 pg (ref 26.0–34.0)
MCH: 31.6 pg (ref 26.0–34.0)
MCHC: 33.7 g/dL (ref 30.0–36.0)
MCHC: 32.5 g/dL (ref 30.0–36.0)
MCV: 96.6 fL (ref 80.0–100.0)
MCV: 97.3 fL (ref 80.0–100.0)
Platelets: 270 10*3/uL (ref 150–400)
Platelets: 271 10*3/uL (ref 150–400)
RBC: 3.84 MIL/uL — ABNORMAL LOW (ref 3.87–5.11)
RBC: 4.05 MIL/uL (ref 3.87–5.11)
RDW: 13.2 % (ref 11.5–15.5)
RDW: 13.2 % (ref 11.5–15.5)
WBC: 8.7 10*3/uL (ref 4.0–10.5)
WBC: 8.3 10*3/uL (ref 4.0–10.5)
nRBC: 0 % (ref 0.0–0.2)
nRBC: 0 % (ref 0.0–0.2)

## 2020-03-17 LAB — I-STAT CHEM 8, ED
BUN: 6 mg/dL — ABNORMAL LOW (ref 8–23)
Calcium, Ion: 1.23 mmol/L (ref 1.15–1.40)
Chloride: 104 mmol/L (ref 98–111)
Creatinine, Ser: 0.7 mg/dL (ref 0.44–1.00)
Glucose, Bld: 84 mg/dL (ref 70–99)
HCT: 38 % (ref 36.0–46.0)
Hemoglobin: 12.9 g/dL (ref 12.0–15.0)
Potassium: 3.9 mmol/L (ref 3.5–5.1)
Sodium: 139 mmol/L (ref 135–145)
TCO2: 25 mmol/L (ref 22–32)

## 2020-03-17 LAB — PROTIME-INR
INR: 1 (ref 0.8–1.2)
Prothrombin Time: 13.2 seconds (ref 11.4–15.2)

## 2020-03-17 LAB — DIFFERENTIAL
Abs Immature Granulocytes: 0.01 10*3/uL (ref 0.00–0.07)
Basophils Absolute: 0 10*3/uL (ref 0.0–0.1)
Basophils Relative: 0 %
Eosinophils Absolute: 0.1 10*3/uL (ref 0.0–0.5)
Eosinophils Relative: 1 %
Immature Granulocytes: 0 %
Lymphocytes Relative: 43 %
Lymphs Abs: 3.7 10*3/uL (ref 0.7–4.0)
Monocytes Absolute: 0.5 10*3/uL (ref 0.1–1.0)
Monocytes Relative: 5 %
Neutro Abs: 4.4 10*3/uL (ref 1.7–7.7)
Neutrophils Relative %: 51 %

## 2020-03-17 LAB — APTT: aPTT: 33 seconds (ref 24–36)

## 2020-03-17 MED ORDER — ASPIRIN 300 MG RE SUPP: 300.0000 mg | Freq: Every day | RECTAL | Status: DC

## 2020-03-17 MED ORDER — ZOLPIDEM TARTRATE 5 MG PO TABS
5.0000 mg | ORAL_TABLET | Freq: Every evening | ORAL | Status: DC | PRN
  Administered 2020-03-18: 04:00:00 5 mg via ORAL
  Filled 2020-03-17 (×2): qty 1

## 2020-03-17 MED ORDER — ENOXAPARIN SODIUM 40 MG/0.4ML ~~LOC~~ SOLN
40.0000 mg | SUBCUTANEOUS | Status: DC
  Administered 2020-03-18: 40 mg via SUBCUTANEOUS
  Filled 2020-03-17: qty 0.4

## 2020-03-17 MED ORDER — PANTOPRAZOLE SODIUM 40 MG PO TBEC
40.0000 mg | DELAYED_RELEASE_TABLET | Freq: Every day | ORAL | Status: DC
  Administered 2020-03-18: 12:00:00 40 mg via ORAL
  Filled 2020-03-17: qty 1

## 2020-03-17 MED ORDER — LORAZEPAM 2 MG/ML IJ SOLN
0.2500 mg | Freq: Once | INTRAMUSCULAR | Status: AC
  Administered 2020-03-17: 22:00:00 0.25 mg via INTRAVENOUS
  Filled 2020-03-17: qty 1

## 2020-03-17 MED ORDER — DULOXETINE HCL 60 MG PO CPEP
60.0000 mg | ORAL_CAPSULE | Freq: Every day | ORAL | Status: DC
  Administered 2020-03-18: 10:00:00 60 mg via ORAL
  Filled 2020-03-17: qty 1

## 2020-03-17 MED ORDER — SODIUM CHLORIDE 0.9% FLUSH: 3.0000 mL | Freq: Once | INTRAVENOUS | Status: DC

## 2020-03-17 MED ORDER — ASPIRIN 325 MG PO TABS
325.0000 mg | ORAL_TABLET | Freq: Every day | ORAL | Status: DC
  Administered 2020-03-18: 325 mg via ORAL
  Filled 2020-03-17: qty 1

## 2020-03-17 MED ORDER — ACETAMINOPHEN 325 MG PO TABS
650.0000 mg | ORAL_TABLET | ORAL | Status: DC | PRN
  Administered 2020-03-18: 650 mg via ORAL
  Filled 2020-03-17: qty 2

## 2020-03-17 MED ORDER — SODIUM CHLORIDE 0.9 % IV SOLN: INTRAVENOUS | Status: DC

## 2020-03-17 MED ORDER — OXYCODONE HCL 5 MG PO TABS
10.0000 mg | ORAL_TABLET | ORAL | Status: DC | PRN
Start: 2020-03-17 — End: 2020-03-18
  Administered 2020-03-18 (×3): 10 mg via ORAL
  Filled 2020-03-17 (×3): qty 2

## 2020-03-17 MED ORDER — ACETAMINOPHEN 160 MG/5ML PO SOLN
650.0000 mg | ORAL | Status: DC | PRN
Start: 2020-03-17 — End: 2020-03-18

## 2020-03-17 MED ORDER — ACETAMINOPHEN 650 MG RE SUPP: 650.0000 mg | RECTAL | Status: DC | PRN

## 2020-03-17 MED ORDER — GABAPENTIN 100 MG PO CAPS
400.0000 mg | ORAL_CAPSULE | Freq: Three times a day (TID) | ORAL | Status: DC
  Administered 2020-03-18 (×2): 400 mg via ORAL
  Filled 2020-03-17 (×2): qty 1

## 2020-03-17 MED ORDER — ATORVASTATIN CALCIUM 80 MG PO TABS
80.0000 mg | ORAL_TABLET | Freq: Every day | ORAL | Status: DC
  Administered 2020-03-18: 80 mg via ORAL
  Filled 2020-03-17: qty 1

## 2020-03-17 MED ORDER — STROKE: EARLY STAGES OF RECOVERY BOOK
Freq: Once | Status: AC
  Filled 2020-03-17: qty 1

## 2020-03-17 NOTE — ED Provider Notes (Signed)
[place] EMERGENCY DEPARTMENT Provider Note   CSN: [id number] Arrival date & time: 03/17/20  1635     History Chief Complaint  Patient presents with  . Cerebrovascular Accident    Deborah Peters is a 64 y.o. female.  Patient was seen at [facility] on 12/18 after awakening with right sided facial numbness, right arm numbness, and right leg numbness. She reports that she was having a little difficulty walking, with slight dragging of right foot. She initially attributed her symptoms to known chronic back problems. Stroke workup initiated at [place], and patient transferred to [facility] for a MRI. After an extended wait in the lobby, and with back pain worsening, patient left prior to receiving the MRI. The MRI was completed as an outpatient on Tuesday night at [facility]. Patient was referred back to the ED by her provider after MRI results revealed an acute/sub-acute left  ischemic infarct thalamocapusar infact and remote lacunar infarct involving the adjacent left ganglia. Patient has continued to have numbness of the face, arm, and leg, but no longer having difficulty walking.  The history is provided by the patient and medical records.  Neurologic Problem This is a new problem. The current episode started more than 2 days ago. The problem has been gradually improving. Associated symptoms include headaches. Pertinent negatives include no chest pain, no abdominal pain and no shortness of breath.       Past Medical History:  Diagnosis Date  . Allergy   . Chronic back pain   . Constipation due to opioid therapy   . DDD (degenerative disc disease), lumbar   . Depression   . Diabetes mellitus, type 2 ([place])   . Fatty liver   . GERD (gastroesophageal reflux disease)    past hx of   . Gestational diabetes    no meds as of 06-01-2016.  diet controlled  . Heart murmur   . Heart murmur   . Hyperlipidemia   . Hypertension    pt this pain related - id  occasionally high   . IFG (impaired fasting glucose)   . Meralgia paraesthetica   . Migraines   . Post-menopausal   . Scoliosis   . Ulcer    years ago. gastric ulcer.    Patient Active Problem List   Diagnosis Date Noted  . Abdominal pain, epigastric 01/14/2018  . NSAID long-term use 01/14/2018  . Knee MCL sprain 10/07/2012  . Right ankle sprain 10/07/2012    Past Surgical History:  Procedure Laterality Date  . ABDOMINAL HYSTERECTOMY     has her ovaries  . BACK SURGERY  2011   L5 S1  . COLONOSCOPY    . JOINT REPLACEMENT     L knee  . knee surgeries     x 8 before replacement   . POLYPECTOMY     HPP in 2008 DB     OB History   No obstetric history on file.     Family History  Problem Relation Age of Onset  . Cancer Maternal Grandmother   . Cancer Maternal Grandfather   . Colon polyps Sister   . Colon polyps Brother   . Stomach cancer Paternal Grandfather   . Esophageal cancer Neg Hx   . Rectal cancer Neg Hx   . Colon cancer Neg Hx   . Pancreatic cancer Neg Hx     Social History   Tobacco Use  . Smoking status: Former Research scientist (life sciences)  . Smokeless tobacco: Never Used  Vaping Use  . Vaping Use: Never used  Substance Use Topics  . Alcohol use: Not Currently    Comment: Rare  . Drug use: No    Home Medications Prior to Admission medications   Medication Sig Start Date End Date Taking? Authorizing Provider  AMBULATORY NON FORMULARY MEDICATION GI cocktail - 84ml 2 % lidocaine, 90ml  (52mh/5ml) dicyclomine, 270 Maalox Take 5- 72ml  2-3 x a day as needed. 01/14/18   Zehr, Laban Emperor, PA-C  aspirin EC 81 MG tablet Take 81 mg by mouth daily.    [provider]  diazepam (VALIUM) 5 MG tablet Take 5 mg by mouth every 6 (six) hours as needed for anxiety or muscle spasms.     [provider]  DULoxetine (CYMBALTA) 60 MG capsule Take 60 mg by mouth daily.    [provider]  ezetimibe (ZETIA) 10 MG tablet Take 10 mg by mouth daily. 10/23/17    [provider]  fish oil-omega-3 fatty acids 1000 MG capsule Take 2 g by mouth 2 (two) times daily.     [provider]  magnesium 30 MG tablet Take 30 mg by mouth 2 (two) times daily.    [provider]  milk thistle 175 MG tablet Take 175 mg by mouth daily.    [provider]  ondansetron (ZOFRAN) 4 MG tablet Take 1 tablet (4 mg total) by mouth every 8 (eight) hours as needed for nausea or vomiting. 01/24/18   Mauri Pole, MD  OVER THE COUNTER MEDICATION Take 1 capsule by mouth daily. Tumeric     [provider]  OVER THE COUNTER MEDICATION Take 1 tablet by mouth daily. Cinnamon daily     [provider]  OVER THE COUNTER MEDICATION Take 1 tablet by mouth at bedtime. alteril natural sleep aide at bedtime     [provider]  Oxycodone HCl 10 MG TABS Take 10 mg by mouth every 4 (four) hours as needed (back pain).  11/23/17   [provider]  pantoprazole (PROTONIX) 40 MG tablet Take 1 tablet (40 mg total) by mouth 2 (two) times daily. 01/24/18   Mauri Pole, MD  pravastatin (PRAVACHOL) 20 MG tablet Take 20 mg by mouth at bedtime. 11/28/17   [provider]  sucralfate (CARAFATE) 1 g tablet Take 1 tablet (1 g total) by mouth 2 (two) times daily. 01/24/18   Mauri Pole, MD  zolpidem (AMBIEN CR) 6.25 MG CR tablet Take 6.25 mg by mouth at bedtime.  12/20/17   [provider]    Allergies    Sulfa antibiotics  Review of Systems   Review of Systems  Respiratory: Negative for shortness of breath.   Cardiovascular: Negative for chest pain.  Gastrointestinal: Negative for abdominal pain.  Neurological: Positive for weakness, numbness and headaches. Negative for facial asymmetry and speech difficulty.  All other systems reviewed and are negative.   Physical Exam Updated Vital Signs BP (!) 141/75 (BP Location: Left Arm)   Pulse 79   Temp 98.2 F (36.8 C) (Oral)   Resp 18   SpO2 99%    Physical Exam Vitals and nursing note reviewed.  HENT:     Head: Normocephalic.     Mouth/Throat:     Mouth: Mucous membranes are moist.  Eyes:     Extraocular Movements: Extraocular movements intact.     Conjunctiva/sclera: Conjunctivae normal.  Cardiovascular:     Rate and Rhythm: Normal rate.  Pulses: Normal pulses.  Pulmonary:     Effort: Pulmonary effort is normal.  Abdominal:     Palpations: Abdomen is soft.  Musculoskeletal:     Cervical back: Neck supple.  Skin:    General: Skin is warm and dry.  Neurological:     Mental Status: She is alert and oriented to person, place, and time.     GCS: GCS eye subscore is 4. GCS verbal subscore is 5. GCS motor subscore is 6.     Cranial Nerves: No facial asymmetry.     Motor: Weakness present.  Psychiatric:        Mood and Affect: Mood normal.        Behavior: Behavior normal.     ED Results / Procedures / Treatments   Labs (all labs ordered are listed, but only abnormal results are displayed) Labs Reviewed  CBC - Abnormal; Notable for the following components:      Result Value   RBC 3.84 (*)    All other components within normal limits  COMPREHENSIVE METABOLIC PANEL - Abnormal; Notable for the following components:   BUN 6 (*)    All other components within normal limits  I-STAT CHEM 8, ED - Abnormal; Notable for the following components:   BUN 6 (*)    All other components within normal limits  PROTIME-INR  APTT  DIFFERENTIAL  CBG MONITORING, ED    EKG EKG Interpretation  Date/Time:  Wednesday March 17 2020 17:42:35 EST Ventricular Rate:  76 PR Interval:  124 QRS Duration: 76 QT Interval:  390 QTC Calculation: 438 R Axis:   88 Text Interpretation: Normal sinus rhythm Normal ECG No significant change since last tracing Confirmed by Wandra Arthurs [phone]) on 03/17/2020 7:32:49 PM   Radiology MR BRAIN WO CONTRAST  Result Date: 03/16/2020 CLINICAL DATA:  Initial evaluation for acute right-sided  numbness and weakness. Dizziness. EXAM: MRI HEAD WITHOUT CONTRAST TECHNIQUE: Multiplanar, multiecho pulse sequences of the brain and surrounding structures were obtained without intravenous contrast. COMPARISON:  None available. FINDINGS: Brain: Generalized age-related cerebral atrophy. Patchy T2/FLAIR hyperintensity within the periventricular and deep white matter both cerebral hemispheres most consistent with chronic small vessel ischemic disease. Patchy involvement of the pons. Overall, appearance is mild in nature. Remote lacunar infarct noted involving the left basal ganglia. 1 cm focus of diffusion abnormality involving the lateral left thalamus/posterior limb of the left internal capsule, consistent with an acute to early subacute ischemic infarct (series 5, image 79). No associated hemorrhage or mass effect. No other diffusion abnormality to suggest acute or subacute ischemia. Gray-white matter differentiation otherwise maintained. No other areas of chronic cortical infarction. No other evidence for acute or chronic intracranial hemorrhage. No mass lesion, midline shift or mass effect. No hydrocephalus or extra-axial fluid collection. Pituitary gland suprasellar region normal. Midline structures intact. Vascular: Major intracranial vascular flow voids are maintained. Skull and upper cervical spine: Craniocervical junction within normal limits. Bone marrow signal intensity normal. No scalp soft tissue abnormality. Sinuses/Orbits: Globes and orbital soft tissues within normal limits. Paranasal sinuses are largely clear. No significant mastoid effusion. Inner ear structures grossly normal. Other: None. IMPRESSION: 1. 1 cm acute to early subacute ischemic left thalamocapsular infarct. No associated hemorrhage or mass effect. 2. Additional remote lacunar infarct involving the adjacent left basal ganglia. 3. Underlying mild chronic microvascular ischemic disease. Electronically Signed   By: Jeannine Boga  M.D.   On: 03/16/2020 20:52    Procedures Procedures (including critical care time)  Medications Ordered in ED Medications  sodium chloride flush (NS) 0.9 % injection 3 mL (has no administration in time range)    ED Course  I have reviewed the triage vital signs and the nursing notes.  Pertinent labs & imaging results that were available during my care of the patient were reviewed by me and considered in my medical decision making (see chart for details).  8:49 PM Discussed patient with neurology Carolin Guernsey). Recommends medicine admit for stroke work-up. He will see the patient is consultation.  9:03 PM Discussed patient with admitter Hal Hope). Will admit.    MDM Rules/Calculators/A&P                          Patient with sub-acute stroke, symptom onset 03/13/20. Patient admitted for stroke work-up with neurology to consult. Final Clinical Impression(s) / ED Diagnoses Final diagnoses:  None    Rx / DC Orders ED Discharge Orders    None       Etta Quill, NP 03/17/20 2109    Drenda Freeze, MD 03/19/20 1304

## 2020-03-17 NOTE — ED Triage Notes (Signed)
Pt sent by her doctor, had an MRI last night that shows a subacute stroke. Pt reports she has been having right arm numbness, last week she was unable to move her right leg. Numbness has improved some. A/o x 4.

## 2020-03-17 NOTE — H&P (Signed)
History and Physical    Deborah Peters YIR:[id number] DOB: 13-Dec-1955 DOA: 03/17/2020  PCP: Crist Infante, MD  Patient coming from: Home.  Chief Complaint: MRI brain showing stroke.  HPI: Deborah Peters is a 64 y.o. female with history of hyperlipidemia, prediabetes, ongoing tobacco abuse and chronic back pain on oxycodone and gabapentin presents to the ER after MRI brain done yesterday showed left-sided stroke.  Patient started having symptoms of right upper and lower and right facial tingling and numbness about a week ago with patient also having to drag her right lower extremity to walk.  Since her symptoms persisted patient was instructed to go to the ER and had gone to the ER on December 18 about 3 days later and thought that time patient was transferred from [facility] to [facility] for MRI and further work-up.  On reaching [facility] patient left AMA.  MRI brain was done after talking to her PCP yesterday and showed left-sided stroke and was instructed to come to the ER.  Patient states right-sided numbness still persists mostly in the right upper extremity and the dragging of the feet has almost resolved.  Denies any difficulty speaking or swallowing or visual symptoms.  Did have some pain around the posterior aspect of her right ear.  ED Course: In the ER patient appears nonfocal and on-call neurologist was consulted.  EKG shows normal sinus rhythm Covid test was negative.  Labs are largely unremarkable.  Patient admitted for further work-up of CVA.  Review of Systems: As per HPI, rest all negative.   Past Medical History:  Diagnosis Date  . Allergy   . Chronic back pain   . Constipation due to opioid therapy   . DDD (degenerative disc disease), lumbar   . Depression   . Diabetes mellitus, type 2 ([place])   . Fatty liver   . GERD (gastroesophageal reflux disease)    past hx of   . Gestational diabetes    no meds as of 06-01-2016.  diet controlled  .  Heart murmur   . Heart murmur   . Hyperlipidemia   . Hypertension    pt this pain related - id occasionally high   . IFG (impaired fasting glucose)   . Meralgia paraesthetica   . Migraines   . Post-menopausal   . Scoliosis   . Ulcer    years ago. gastric ulcer.    Past Surgical History:  Procedure Laterality Date  . ABDOMINAL HYSTERECTOMY     has her ovaries  . BACK SURGERY  2011   L5 S1  . COLONOSCOPY    . JOINT REPLACEMENT     L knee  . knee surgeries     x 8 before replacement   . POLYPECTOMY     HPP in 2008 DB     reports that she has quit smoking. She has never used smokeless tobacco. She reports previous alcohol use. She reports that she does not use drugs.  Allergies  Allergen Reactions  . Sulfa Antibiotics Anaphylaxis    Family History  Problem Relation Age of Onset  . Cancer Maternal Grandmother   . Cancer Maternal Grandfather   . Colon polyps Sister   . Colon polyps Brother   . Stomach cancer Paternal Grandfather   . Esophageal cancer Neg Hx   . Rectal cancer Neg Hx   . Colon cancer Neg Hx   . Pancreatic cancer Neg Hx     Prior to  Admission medications   Medication Sig Start Date End Date Taking? Authorizing Provider  AMBULATORY NON FORMULARY MEDICATION GI cocktail - 29ml 2 % lidocaine, 13ml  (63mh/5ml) dicyclomine, 270 Maalox Take 5- 66ml  2-3 x a day as needed. 01/14/18   Zehr, Princella Pellegrini, PA-C  aspirin EC 81 MG tablet Take 81 mg by mouth daily.    [provider]  diazepam (VALIUM) 5 MG tablet Take 5 mg by mouth every 6 (six) hours as needed for anxiety or muscle spasms.     [provider]  DULoxetine (CYMBALTA) 60 MG capsule Take 60 mg by mouth daily.    [provider]  ezetimibe (ZETIA) 10 MG tablet Take 10 mg by mouth daily. 10/23/17   [provider]  fish oil-omega-3 fatty acids 1000 MG capsule Take 2 g by mouth 2 (two) times daily.     [provider]  magnesium 30 MG tablet Take 30 mg by mouth 2  (two) times daily.    [provider]  milk thistle 175 MG tablet Take 175 mg by mouth daily.    [provider]  ondansetron (ZOFRAN) 4 MG tablet Take 1 tablet (4 mg total) by mouth every 8 (eight) hours as needed for nausea or vomiting. 01/24/18   Napoleon Form, MD  OVER THE COUNTER MEDICATION Take 1 capsule by mouth daily. Tumeric     [provider]  OVER THE COUNTER MEDICATION Take 1 tablet by mouth daily. Cinnamon daily     [provider]  OVER THE COUNTER MEDICATION Take 1 tablet by mouth at bedtime. alteril natural sleep aide at bedtime     [provider]  Oxycodone HCl 10 MG TABS Take 10 mg by mouth every 4 (four) hours as needed (back pain).  11/23/17   [provider]  pantoprazole (PROTONIX) 40 MG tablet Take 1 tablet (40 mg total) by mouth 2 (two) times daily. 01/24/18   Napoleon Form, MD  pravastatin (PRAVACHOL) 20 MG tablet Take 20 mg by mouth at bedtime. 11/28/17   [provider]  sucralfate (CARAFATE) 1 g tablet Take 1 tablet (1 g total) by mouth 2 (two) times daily. 01/24/18   Napoleon Form, MD  zolpidem (AMBIEN CR) 6.25 MG CR tablet Take 6.25 mg by mouth at bedtime.  12/20/17   [provider]    Physical Exam: Constitutional: Moderately built and nourished. Vitals:   03/17/20 1742 03/17/20 1934 03/17/20 2000  BP: 134/69 (!) 141/75 (!) 163/93  Pulse: 81 79 71  Resp: 16 18 10   Temp: 98.7 F (37.1 C) 98.2 F (36.8 C)   TempSrc: Oral Oral   SpO2: 98% 99% 100%   Eyes: Anicteric no pallor. ENMT: No discharge from the ears eyes nose or mouth. Neck: No mass felt.  No neck rigidity. Respiratory: No rhonchi or crepitations. Cardiovascular: S1-S2 heard. Abdomen: Soft nontender bowel sounds present. Musculoskeletal: No edema. Skin: No rash. Neurologic: Alert awake oriented to time place and person.  Moves all extremities 5 x 5.  No facial asymmetry tongue is midline pupils are equal and  reacting to light. Psychiatric: Appears normal.  Normal affect.   Labs on Admission: I have personally reviewed following labs and imaging studies  CBC: Recent Labs  Lab 03/13/20 1716 03/17/20 1743 03/17/20 1810  WBC 9.1 8.7  --   NEUTROABS 4.8 4.4  --   HGB 12.2 12.5 12.9  HCT 35.6* 37.1 38.0  MCV 95.2 96.6  --  PLT 231 270  --    Basic Metabolic Panel: Recent Labs  Lab 03/13/20 1716 03/17/20 1743 03/17/20 1810  NA 139 139 139  K 3.6 3.9 3.9  CL 106 104 104  CO2 25 25  --   GLUCOSE 115* 90 84  BUN 8 6* 6*  CREATININE 0.53 0.70 0.70  CALCIUM 9.1 9.5  --    GFR: Estimated Creatinine Clearance: 66.5 mL/min (by C-G formula based on SCr of 0.7 mg/dL). Liver Function Tests: Recent Labs  Lab 03/17/20 1743  AST 16  ALT 16  ALKPHOS 53  BILITOT 0.7  PROT 6.7  ALBUMIN 4.1   No results for input(s): LIPASE, AMYLASE in the last 168 hours. No results for input(s): AMMONIA in the last 168 hours. Coagulation Profile: Recent Labs  Lab 03/17/20 1743  INR 1.0   Cardiac Enzymes: No results for input(s): CKTOTAL, CKMB, CKMBINDEX, TROPONINI in the last 168 hours. BNP (last 3 results) No results for input(s): PROBNP in the last 8760 hours. HbA1C: No results for input(s): HGBA1C in the last 72 hours. CBG: No results for input(s): GLUCAP in the last 168 hours. Lipid Profile: No results for input(s): CHOL, HDL, LDLCALC, TRIG, CHOLHDL, LDLDIRECT in the last 72 hours. Thyroid Function Tests: No results for input(s): TSH, T4TOTAL, FREET4, T3FREE, THYROIDAB in the last 72 hours. Anemia Panel: No results for input(s): VITAMINB12, FOLATE, FERRITIN, TIBC, IRON, RETICCTPCT in the last 72 hours. Urine analysis:    Component Value Date/Time   COLORURINE YELLOW 05/20/2012 1528   APPEARANCEUR CLEAR 05/20/2012 1528   LABSPEC 1.014 05/20/2012 1528   PHURINE 5.5 05/20/2012 1528   GLUCOSEU NEGATIVE 05/20/2012 1528   HGBUR NEGATIVE 05/20/2012 1528   BILIRUBINUR NEGATIVE  05/20/2012 1528   KETONESUR NEGATIVE 05/20/2012 1528   PROTEINUR NEGATIVE 05/20/2012 1528   UROBILINOGEN 0.2 05/20/2012 1528   NITRITE NEGATIVE 05/20/2012 1528   LEUKOCYTESUR TRACE (A) 05/20/2012 1528   Sepsis Labs: @LABRCNTIP (procalcitonin:4,lacticidven:4) )No results found for this or any previous visit (from the past 240 hour(s)).   Radiological Exams on Admission: MR BRAIN WO CONTRAST  Result Date: 03/16/2020 CLINICAL DATA:  Initial evaluation for acute right-sided numbness and weakness. Dizziness. EXAM: MRI HEAD WITHOUT CONTRAST TECHNIQUE: Multiplanar, multiecho pulse sequences of the brain and surrounding structures were obtained without intravenous contrast. COMPARISON:  None available. FINDINGS: Brain: Generalized age-related cerebral atrophy. Patchy T2/FLAIR hyperintensity within the periventricular and deep white matter both cerebral hemispheres most consistent with chronic small vessel ischemic disease. Patchy involvement of the pons. Overall, appearance is mild in nature. Remote lacunar infarct noted involving the left basal ganglia. 1 cm focus of diffusion abnormality involving the lateral left thalamus/posterior limb of the left internal capsule, consistent with an acute to early subacute ischemic infarct (series 5, image 79). No associated hemorrhage or mass effect. No other diffusion abnormality to suggest acute or subacute ischemia. Gray-white matter differentiation otherwise maintained. No other areas of chronic cortical infarction. No other evidence for acute or chronic intracranial hemorrhage. No mass lesion, midline shift or mass effect. No hydrocephalus or extra-axial fluid collection. Pituitary gland suprasellar region normal. Midline structures intact. Vascular: Major intracranial vascular flow voids are maintained. Skull and upper cervical spine: Craniocervical junction within normal limits. Bone marrow signal intensity normal. No scalp soft tissue abnormality. Sinuses/Orbits:  Globes and orbital soft tissues within normal limits. Paranasal sinuses are largely clear. No significant mastoid effusion. Inner ear structures grossly normal. Other: None. IMPRESSION: 1. 1 cm acute to early subacute ischemic left thalamocapsular  infarct. No associated hemorrhage or mass effect. 2. Additional remote lacunar infarct involving the adjacent left basal ganglia. 3. Underlying mild chronic microvascular ischemic disease. Electronically Signed   By: Jeannine Boga M.D.   On: 03/16/2020 20:52    EKG: Independently reviewed.  Normal sinus rhythm.  Assessment/Plan Principal Problem:   Acute CVA (cerebrovascular accident) ([place]) Active Problems:   HLD (hyperlipidemia)    1. Acute CVA -patient symptoms have been persistent for the last 1 week.  Discussed with on-call neurologist.  Plan is to get MRA of the head carotid Doppler 2D echo continue to monitor in telemetry neurochecks.  Patient passed swallow evaluation.  On aspirin Lipitor.  Physical therapy consult.  Check hemoglobin A1c and lipid panel. 2. Hyperlipidemia presently on Lipitor 80 mg.  Check lipid panel. 3. History of prediabetes we will check hemoglobin A1c. 4. Chronic low back pain on oxycodone and gabapentin. 5. Tobacco abuse advised about quitting.   DVT prophylaxis: Lovenox. Code Status: Full code. Family Communication: Patient's husband at the bedside. Disposition Plan: Home. Consults called: Neurology. Admission status: Observation.   Rise Patience MD [facility] Pager [phone].  If 7PM-7AM, please contact night-coverage [email or web address] Password [facility]  03/17/2020, 9:13 PM

## 2020-03-18 ENCOUNTER — Observation Stay (HOSPITAL_BASED_OUTPATIENT_CLINIC_OR_DEPARTMENT_OTHER): Payer: PPO

## 2020-03-18 DIAGNOSIS — I639 Cerebral infarction, unspecified: Secondary | ICD-10-CM

## 2020-03-18 DIAGNOSIS — I6389 Other cerebral infarction: Secondary | ICD-10-CM | POA: Diagnosis not present

## 2020-03-18 LAB — LIPID PANEL
Cholesterol: 174 mg/dL (ref 0–200)
HDL: 30 mg/dL — ABNORMAL LOW (ref >40)
LDL Cholesterol: UNDETERMINED mg/dL (ref 0–99)
Total CHOL/HDL Ratio: 5.8 RATIO
Triglycerides: 437 mg/dL — ABNORMAL HIGH (ref <150)
VLDL: UNDETERMINED mg/dL (ref 0–40)

## 2020-03-18 LAB — HEMOGLOBIN A1C
Hgb A1c MFr Bld: 5.7 % — ABNORMAL HIGH (ref 4.8–5.6)
Mean Plasma Glucose: 116.89 mg/dL

## 2020-03-18 LAB — HIV ANTIBODY (ROUTINE TESTING W REFLEX): HIV Screen 4th Generation wRfx: NONREACTIVE

## 2020-03-18 LAB — ECHOCARDIOGRAM COMPLETE
Area-P 1/2: 2.24 cm2
Height: 66 in
S' Lateral: 3 cm
Weight: 2469.15 oz

## 2020-03-18 LAB — LDL CHOLESTEROL, DIRECT: Direct LDL: 95.1 mg/dL (ref 0–99)

## 2020-03-18 MED ORDER — CLOPIDOGREL BISULFATE 75 MG PO TABS
75.0000 mg | ORAL_TABLET | Freq: Every day | ORAL | Status: DC
  Administered 2020-03-18: 12:00:00 75 mg via ORAL
  Filled 2020-03-18: qty 1

## 2020-03-18 MED ORDER — CLOPIDOGREL BISULFATE 75 MG PO TABS: 75.0000 mg | ORAL_TABLET | Freq: Every day | ORAL | 1 refills | Status: AC

## 2020-03-18 MED ORDER — ATORVASTATIN CALCIUM 80 MG PO TABS: 80.0000 mg | ORAL_TABLET | Freq: Every day | ORAL | 1 refills | Status: AC

## 2020-03-18 MED ORDER — ATORVASTATIN CALCIUM 80 MG PO TABS: 80.0000 mg | ORAL_TABLET | Freq: Every day | ORAL | Status: DC

## 2020-03-18 MED ORDER — ASPIRIN EC 81 MG PO TBEC
81.0000 mg | DELAYED_RELEASE_TABLET | Freq: Every day | ORAL | Status: DC
  Administered 2020-03-18: 12:00:00 81 mg via ORAL
  Filled 2020-03-18: qty 1

## 2020-03-18 MED ORDER — PRAVASTATIN SODIUM 10 MG PO TABS: 20.0000 mg | ORAL_TABLET | Freq: Every day | ORAL | Status: DC

## 2020-03-18 NOTE — Progress Notes (Signed)
Echocardiogram 2D Echocardiogram has been performed.  Oneal Deputy Pearley Millington 03/18/2020, 9:41 AM

## 2020-03-18 NOTE — ED Notes (Signed)
Lunch Tray Ordered @ 1046. 

## 2020-03-18 NOTE — Progress Notes (Signed)
Occupational Therapy Evaluation Patient Details Name: Deborah Peters MRN: [id number] DOB: 1956-03-02 Today's Date: 03/18/2020    History of Present Illness This 64 y.o. female admitted with intermittent Rt UE and Rt LE as well as facial numbness and tingling as well as intermittent Rt LE weakness.  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  PMH includes:  ulcer, scoliosis, migraines, HTN, gestational diabetes, DM, depression, lumbar DDD; s/p Lt TKA, s/p back surgery L5 S1   Clinical Impression   Patient evaluated by Occupational Therapy with no further acute OT needs identified. All education has been completed and the patient has no further questions. Pt appears to be close to baseline - she does have some sensory deficits in Rt hand and have instructed her in HEP.  Also instructed her to have initial supervision for medication and financial management and cooking to ensure no higher level cognitive deficit present.   See below for any follow-up Occupational Therapy or equipment needs. OT is signing off. Thank you for this referral.      Follow Up Recommendations  No OT follow up;Supervision - Intermittent    Equipment Recommendations  None recommended by OT    Recommendations for Other Services       Precautions / Restrictions Precautions Precautions: Fall Restrictions Weight Bearing Restrictions: No      Mobility Bed Mobility Overal bed mobility: Modified Independent Bed Mobility: Supine to Sit     Supine to sit: Supervision     General bed mobility comments: Supervision for safety.    Transfers Overall transfer level: Modified independent Equipment used: None Transfers: Sit to/from Stand Sit to Stand: Supervision         General transfer comment: Supervision for safety.    Balance Overall balance assessment: Mild deficits observed, not formally tested                                          ADL either performed or assessed with clinical judgement   ADL Overall ADL's : Modified independent;At baseline                                       General ADL Comments: Pt has shower seat at home     Vision         Perception     Praxis      Pertinent Vitals/Pain Pain Assessment: 0-10 Pain Score: 7  Pain Location: back Pain Descriptors / Indicators: Aching;Grimacing;Guarding Pain Intervention(s): Monitored during session     Hand Dominance     Extremity/Trunk Assessment Upper Extremity Assessment Upper Extremity Assessment: RUE deficits/detail RUE Deficits / Details: Pt reports tingling Rt hand and forearm, with occasional difficulty manipulating objects.  Was able to self feed and manipulate utensils without difficulty RUE Sensation: decreased light touch   Lower Extremity Assessment Lower Extremity Assessment: Defer to PT evaluation RLE Deficits / Details: Reports mild decrease in sensation.   Cervical / Trunk Assessment Cervical / Trunk Assessment: Other exceptions Cervical / Trunk Exceptions: back pain at baseline   Communication Communication Communication: No difficulties   Cognition Arousal/Alertness: Awake/alert Behavior During Therapy: WFL for tasks assessed/performed Overall Cognitive Status: Within Functional Limits for tasks assessed  General Comments: Grossly assessed.  Pt scored 0/28 on the Short Blessed Test which = no errors.  She is verbose and engaging resulting in frequent self distraction - anticipate this is her normal, but no family present to confirm.  Did discuss possible cognitive side effects with executive functioning and recommended she have supervision with medication management, cooking, finances and driving initially.   General Comments       Exercises Exercises: Other exercises Other Exercises Other Exercises: Pt  instructed in sensory stimulation exercises for Rt UE - such as hiding objects in a bowel/container of dried rice and rubbing hand with a dry washcloth.  She verbalized understanding   Shoulder Instructions      Home Living Family/patient expects to be discharged to:: Private residence Living Arrangements: Children;Spouse/significant other Available Help at Discharge: Family Type of Home: House Home Access: Stairs to enter Technical brewer of Steps: 1 Entrance Stairs-Rails: None Home Layout: Two level Alternate Level Stairs-Number of Steps: flight Alternate Level Stairs-Rails: Right;Left (L side is all the way up) Bathroom Shower/Tub: Occupational psychologist: Handicapped height     Home Equipment: Grab bars - tub/shower;Shower seat - built in;Crutches          Prior Functioning/Environment Level of Independence: Independent                 OT Problem List: Impaired balance (sitting and/or standing);Impaired sensation      OT Treatment/Interventions:      OT Goals(Current goals can be found in the care plan section) Acute Rehab OT Goals Patient Stated Goal: to get back to normal OT Goal Formulation: All assessment and education complete, DC therapy  OT Frequency:     Barriers to D/C:            Co-evaluation              AM-PAC OT "6 Clicks" Daily Activity     Outcome Measure Help from another person eating meals?: None Help from another person taking care of personal grooming?: None Help from another person toileting, which includes using toliet, bedpan, or urinal?: None Help from another person bathing (including washing, rinsing, drying)?: None Help from another person to put on and taking off regular upper body clothing?: None Help from another person to put on and taking off regular lower body clothing?: None 6 Click Score: 24   End of Session    Activity Tolerance: Patient tolerated treatment well Patient left: in bed (sitting  EOB q)  OT Visit Diagnosis: Unsteadiness on feet (R26.81)                Time: 6433-2951 OT Time Calculation (min): 30 min Charges:  OT General Charges $OT Visit: 1 Visit OT Evaluation $OT Eval Low Complexity: 1 Low OT Treatments $Therapeutic Activity: 8-22 mins  Deborah Peters., OTR/L Acute Rehabilitation Services Pager [phone] Office [phone]   Deborah Peters 03/18/2020, 11:37 AM

## 2020-03-18 NOTE — Progress Notes (Signed)
Carotid completed   Please see CV Proc for preliminary results.   Andie Mortimer, RVT  

## 2020-03-18 NOTE — Discharge Summary (Signed)
Deborah Peters [id number] DOB: Jun 08, 1955 DOA: 03/17/2020  PCP: Crist Infante, MD  Admit date: 03/17/2020 Discharge date: 03/18/2020  Admitted From: home Disposition:  home  Recommendations for Outpatient Follow-up:  1. Follow up with PCP in 1 week 2. Please obtain BMP/CBC in one week 3. Neurology Dr. Leonie Man in 6 weeks     Discharge Condition:Stable CODE STATUS:full  Diet recommendation: Heart Healthy  Brief/Interim Summary: Per HPI: Deborah Peters is a 64 y.o. female with history of hyperlipidemia, prediabetes, ongoing tobacco abuse and chronic back pain on oxycodone and gabapentin presents to the ER after MRI brain done yesterday showed left-sided stroke.  Patient started having symptoms of right upper and lower and right facial tingling and numbness about a week ago with patient also having to drag her right lower extremity to walk.  Since her symptoms persisted patient was instructed to go to the ER and had gone to the ER on December 18 about 3 days later and thought that time [facility] for MRI and further work-up.  On reaching [facility] patient left AMA.  MRI brain was done after talking to her PCP yesterday and showed left-sided stroke and was instructed to come to the ER.  Patient states right-sided numbness still persists mostly in the right upper extremity and the dragging of the feet has almost resolved.   Neurology was consulted. Pt symptoms improved, and neurology felt patient was ok to be discharged home today.  1. 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 Remote L basal ganglia lacune. Small vessel disease.   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  No cardiac source of embolism.  LDL UTC d/t TG 467, direct LDL 95.1    HgbA1c 5.7  Take lipitor 80mg  qhs, goal LDL <70  Take palvix and aspirin for 3 weeks , then plavix alone thereafter.   Risk factor modification  10. Hyperlipidemia-on Lipitor 80 mg. LDL <70 11. History of prediabetes A1c 5.7 12. Chronic low back pain- on oxycodone and gabapentin. 13. Tobacco abuse -advised about quitting.    Discharge Diagnoses:  Principal Problem:   Acute CVA (cerebrovascular accident) [facility]) Active Problems:   HLD (hyperlipidemia)    Discharge Instructions  Discharge Instructions    Ambulatory referral to Neurology   Complete by: As directed    Follow up in stroke clinic at [facility] with Frann Rider, NP in about 4 weeks. If not available, consider Dr. Antony Contras, Dr. Bess Harvest, or Dr. Sarina Ill.   Call MD for:  persistant dizziness or light-headedness   Complete by: As directed    Diet - low sodium heart healthy   Complete by: As directed    Discharge instructions   Complete by: As directed    Take aspirin 81mg  daily with plavix 75mg  daily for 3 weeks , after this, take plavix alone for life F/u with neurology and pcp Need to get your LDL <70   Increase activity slowly   Complete by: As directed      Allergies as of 03/18/2020      Reactions   Sulfa Antibiotics Anaphylaxis      Medication List    STOP taking these medications   acetaminophen 500 MG tablet Commonly known as: TYLENOL  pravastatin 20 MG tablet Commonly known as: PRAVACHOL     TAKE these medications   albuterol 108 (90 Base) MCG/ACT inhaler Commonly known as: VENTOLIN HFA Inhale 2 puffs into the lungs every 6 (six) hours as needed for wheezing or shortness of breath.   ascorbic acid 500 MG tablet Commonly known as: VITAMIN C Take 500 mg by mouth daily.   aspirin EC 81 MG tablet Take 81 mg by mouth daily.   atorvastatin 80 MG tablet Commonly known as: LIPITOR Take 1 tablet (80 mg total) by mouth at bedtime.   benzonatate 100  MG capsule Commonly known as: TESSALON Take 100 mg by mouth 3 (three) times daily as needed for cough.   clopidogrel 75 MG tablet Commonly known as: PLAVIX Take 1 tablet (75 mg total) by mouth daily. Start taking on: March 19, 2020   diazepam 5 MG tablet Commonly known as: VALIUM Take 5 mg by mouth every 6 (six) hours as needed for anxiety or muscle spasms.   DULoxetine 60 MG capsule Commonly known as: CYMBALTA Take 60 mg by mouth daily.   ezetimibe 10 MG tablet Commonly known as: ZETIA Take 10 mg by mouth daily.   fish oil-omega-3 fatty acids 1000 MG capsule Take 2 g by mouth 2 (two) times daily.   gabapentin 400 MG capsule Commonly known as: NEURONTIN Take 400 mg by mouth 3 (three) times daily.   magnesium 30 MG tablet Take 30 mg by mouth 2 (two) times daily.   milk thistle 175 MG tablet Take 175 mg by mouth daily.   OVER THE COUNTER MEDICATION Take 1 capsule by mouth daily. Tumeric   OVER THE COUNTER MEDICATION Take 1 tablet by mouth daily. Cinnamon daily   OVER THE COUNTER MEDICATION Take 1 tablet by mouth at bedtime. alteril natural sleep aide at bedtime   Oxycodone HCl 10 MG Tabs Take 10 mg by mouth every 4 (four) hours as needed (back pain).   pantoprazole 40 MG tablet Commonly known as: PROTONIX Take 1 tablet (40 mg total) by mouth 2 (two) times daily. What changed: when to take this   promethazine 25 MG tablet Commonly known as: PHENERGAN Take 25 mg by mouth every 6 (six) hours as needed.   VITAMIN E PO Take 1 capsule by mouth daily.   zolpidem 6.25 MG CR tablet Commonly known as: AMBIEN CR Take 6.25 mg by mouth at bedtime.       Follow-up Information    [facility] Follow up in 4 week(s).   Specialty: Neurology Why: stroke clinic. office will call with appt date and time Contact information: [place], [place] Follow up in 1 week(s).    Specialty: Internal Medicine Contact information: [place] [address] [phone]              Allergies  Allergen Reactions  . Sulfa Antibiotics Anaphylaxis    Consultations:  neurology   Procedures/Studies: MR ANGIO HEAD WO CONTRAST  Result Date: 03/17/2020 CLINICAL DATA:  Acute left thalamocapsular infarct. EXAM: MRA HEAD WITHOUT CONTRAST TECHNIQUE: Angiographic images of the Circle of Willis were obtained using MRA technique without intravenous contrast. COMPARISON:  None. FINDINGS: 3 mm left paraophthalmic artery aneurysm is present. There is some atherosclerotic irregularity within the cavernous internal carotid arteries bilaterally without significant stenosis. The A1 and M1 segments are within normal limits. MCA bifurcations are normal. No significant proximal  stenosis or occlusion is present. No additional aneurysms are present. The left vertebral artery is dominant. The left PICA origin is just below the field of view. Right PICA origin is within normal limits. Basilar artery is normal. Both posterior cerebral arteries originate from the basilar tip. PCA branch vessels are normal. IMPRESSION: 1. 3 mm left paraophthalmic artery aneurysm. 2. Mild atherosclerotic irregularity within the cavernous internal carotid arteries without significant stenosis. 3. Otherwise normal MRA Circle of Willis. Electronically Signed   By: San Morelle M.D.   On: 03/17/2020 23:11   MR BRAIN WO CONTRAST  Result Date: 03/16/2020 CLINICAL DATA:  Initial evaluation for acute right-sided numbness and weakness. Dizziness. EXAM: MRI HEAD WITHOUT CONTRAST TECHNIQUE: Multiplanar, multiecho pulse sequences of the brain and surrounding structures were obtained without intravenous contrast. COMPARISON:  None available. FINDINGS: Brain: Generalized age-related cerebral atrophy. Patchy T2/FLAIR hyperintensity within the periventricular and deep white matter both cerebral hemispheres  most consistent with chronic small vessel ischemic disease. Patchy involvement of the pons. Overall, appearance is mild in nature. Remote lacunar infarct noted involving the left basal ganglia. 1 cm focus of diffusion abnormality involving the lateral left thalamus/posterior limb of the left internal capsule, consistent with an acute to early subacute ischemic infarct (series 5, image 79). No associated hemorrhage or mass effect. No other diffusion abnormality to suggest acute or subacute ischemia. Gray-white matter differentiation otherwise maintained. No other areas of chronic cortical infarction. No other evidence for acute or chronic intracranial hemorrhage. No mass lesion, midline shift or mass effect. No hydrocephalus or extra-axial fluid collection. Pituitary gland suprasellar region normal. Midline structures intact. Vascular: Major intracranial vascular flow voids are maintained. Skull and upper cervical spine: Craniocervical junction within normal limits. Bone marrow signal intensity normal. No scalp soft tissue abnormality. Sinuses/Orbits: Globes and orbital soft tissues within normal limits. Paranasal sinuses are largely clear. No significant mastoid effusion. Inner ear structures grossly normal. Other: None. IMPRESSION: 1. 1 cm acute to early subacute ischemic left thalamocapsular infarct. No associated hemorrhage or mass effect. 2. Additional remote lacunar infarct involving the adjacent left basal ganglia. 3. Underlying mild chronic microvascular ischemic disease. Electronically Signed   By: Jeannine Boga M.D.   On: 03/16/2020 20:52   ECHOCARDIOGRAM COMPLETE  Result Date: 03/18/2020    ECHOCARDIOGRAM REPORT   Patient Name:   Deborah Peters Date of Exam: 03/18/2020 Medical Rec #:  [id number]      Height:       66.0 in Accession #:    [id number]     Weight:       154.3 lb Date of Birth:  05-Dec-1955      BSA:          1.791 m Patient Age:    52 years       BP:           132/71 mmHg Patient  Gender: F              HR:           73 bpm. Exam Location:  Inpatient Procedure: 2D Echo, Color Doppler and Cardiac Doppler Indications:    Stroke i163.9  History:        Patient has no prior history of Echocardiogram examinations.                 Risk Factors:Hypertension, Diabetes and Dyslipidemia.  Sonographer:    Raquel Sarna Senior RDCS Referring Phys: Gerber  Sonographer Comments: Technically difficult study due  to poor echo windows. IMPRESSIONS  1. Left ventricular ejection fraction, by estimation, is 60 to 65%. The left ventricle has normal function. The left ventricle has no regional wall motion abnormalities. Left ventricular diastolic parameters are consistent with Grade I diastolic dysfunction (impaired relaxation).  2. Right ventricular systolic function is normal. The right ventricular size is normal.  3. The mitral valve is normal in structure. No evidence of mitral valve regurgitation. No evidence of mitral stenosis.  4. The aortic valve is tricuspid. Aortic valve regurgitation is not visualized. No aortic stenosis is present.  5. The inferior vena cava is normal in size with greater than 50% respiratory variability, suggesting right atrial pressure of 3 mmHg. FINDINGS  Left Ventricle: Left ventricular ejection fraction, by estimation, is 60 to 65%. The left ventricle has normal function. The left ventricle has no regional wall motion abnormalities. The left ventricular internal cavity size was normal in size. There is  no left ventricular hypertrophy. Left ventricular diastolic parameters are consistent with Grade I diastolic dysfunction (impaired relaxation). Indeterminate filling pressures. Right Ventricle: The right ventricular size is normal. No increase in right ventricular wall thickness. Right ventricular systolic function is normal. Left Atrium: Left atrial size was normal in size. Right Atrium: Right atrial size was normal in size. Pericardium: There is no evidence of  pericardial effusion. Mitral Valve: The mitral valve is normal in structure. No evidence of mitral valve regurgitation. No evidence of mitral valve stenosis. Tricuspid Valve: The tricuspid valve is normal in structure. Tricuspid valve regurgitation is not demonstrated. No evidence of tricuspid stenosis. Aortic Valve: The aortic valve is tricuspid. Aortic valve regurgitation is not visualized. No aortic stenosis is present. Pulmonic Valve: The pulmonic valve was normal in structure. Pulmonic valve regurgitation is not visualized. No evidence of pulmonic stenosis. Aorta: The aortic root is normal in size and structure. Venous: The inferior vena cava is normal in size with greater than 50% respiratory variability, suggesting right atrial pressure of 3 mmHg. IAS/Shunts: No atrial level shunt detected by color flow Doppler.  LEFT VENTRICLE PLAX 2D LVIDd:         4.80 cm  Diastology LVIDs:         3.00 cm  LV e' medial:    7.07 cm/s LV PW:         0.80 cm  LV E/e' medial:  9.1 LV IVS:        0.90 cm  LV e' lateral:   9.90 cm/s LVOT diam:     2.00 cm  LV E/e' lateral: 6.5 LV SV:         53 LV SV Index:   29 LVOT Area:     3.14 cm  RIGHT VENTRICLE RV S prime:     9.90 cm/s TAPSE (M-mode): 2.4 cm LEFT ATRIUM             Index       RIGHT ATRIUM           Index LA diam:        2.70 cm 1.51 cm/m  RA Area:     14.40 cm LA Vol (A2C):   44.6 ml 24.90 ml/m RA Volume:   37.30 ml  20.82 ml/m LA Vol (A4C):   30.7 ml 17.14 ml/m LA Biplane Vol: 37.9 ml 21.16 ml/m  AORTIC VALVE LVOT Vmax:   89.70 cm/s LVOT Vmean:  55.900 cm/s LVOT VTI:    0.168 m  AORTA Ao Root diam: 3.10 cm Ao Asc  diam:  2.70 cm MITRAL VALVE MV Area (PHT): 2.24 cm    SHUNTS MV Decel Time: 338 msec    Systemic VTI:  0.17 m MV E velocity: 64.30 cm/s  Systemic Diam: 2.00 cm MV A velocity: 86.60 cm/s MV E/A ratio:  0.74 Skeet Latch MD Electronically signed by Skeet Latch MD Signature Date/Time: 03/18/2020/11:42:54 AM    Final    VAS US CAROTID (at [facility]  and [facility] only)  Result Date: 03/18/2020 Carotid Arterial Duplex Study Indications:  CVA. Risk Factors: Hyperlipidemia. Performing Technologist: Vonzell Schlatter RVT  Examination Guidelines: A complete evaluation includes B-mode imaging, spectral Doppler, color Doppler, and power Doppler as needed of all accessible portions of each vessel. Bilateral testing is considered an integral part of a complete examination. Limited examinations for reoccurring indications may be performed as noted.  Right Carotid Findings: +----------+--------+--------+--------+------------------+--------+           PSV cm/sEDV cm/sStenosisPlaque DescriptionComments +----------+--------+--------+--------+------------------+--------+ CCA Prox  73      18                                         +----------+--------+--------+--------+------------------+--------+ CCA Distal67      13                                         +----------+--------+--------+--------+------------------+--------+ ICA Prox  68      23      1-39%   heterogenous               +----------+--------+--------+--------+------------------+--------+ ICA Distal51      26                                         +----------+--------+--------+--------+------------------+--------+ ECA       93      19                                         +----------+--------+--------+--------+------------------+--------+ +----------+--------+-------+--------+-------------------+           PSV cm/sEDV cmsDescribeArm Pressure (mmHG) +----------+--------+-------+--------+-------------------+ Subclavian119                                        +----------+--------+-------+--------+-------------------+ +---------+--------+--+--------+--+ VertebralPSV cm/s41EDV cm/s11 +---------+--------+--+--------+--+  Left Carotid Findings: +----------+--------+--------+--------+------------------+--------+           PSV cm/sEDV cm/sStenosisPlaque  DescriptionComments +----------+--------+--------+--------+------------------+--------+ CCA Prox  112     27                                         +----------+--------+--------+--------+------------------+--------+ CCA Distal74      17                                         +----------+--------+--------+--------+------------------+--------+ ICA Prox  81      17      1-39%  heterogenous               +----------+--------+--------+--------+------------------+--------+ ICA Distal83      26                                         +----------+--------+--------+--------+------------------+--------+ ECA       88      17                                         +----------+--------+--------+--------+------------------+--------+ +----------+--------+--------+--------+-------------------+           PSV cm/sEDV cm/sDescribeArm Pressure (mmHG) +----------+--------+--------+--------+-------------------+ Subclavian115                                         +----------+--------+--------+--------+-------------------+ +---------+--------+--+--------+--+ VertebralPSV cm/s64EDV cm/s22 +---------+--------+--+--------+--+   Summary: Right Carotid: Velocities in the right ICA are consistent with a 1-39% stenosis. Left Carotid: Velocities in the left ICA are consistent with a 1-39% stenosis. Vertebrals:  Bilateral vertebral arteries demonstrate antegrade flow. Subclavians: Normal flow hemodynamics were seen in bilateral subclavian              arteries. *See table(s) above for measurements and observations.  Electronically signed by Antony Contras MD on 03/18/2020 at 12:57:27 PM.    Final        Subjective: Feels sx are improving. Has no other complaints.   Discharge Exam: Vitals:   03/18/20 1135 03/18/20 1300  BP: 109/63 134/73  Pulse: 77 68  Resp: 18 14  Temp:    SpO2: 95% 96%   Vitals:   03/18/20 0903 03/18/20 1000 03/18/20 1135 03/18/20 1300  BP:  125/77  109/63 134/73  Pulse:  72 77 68  Resp:  18 18 14   Temp: 98.3 F (36.8 C)     TempSrc: Oral     SpO2:  96% 95% 96%  Weight:      Height:        General: Pt is alert, awake, not in acute distress Cardiovascular: RRR, S1/S2 +, no rubs, no gallops Respiratory: CTA bilaterally, no wheezing, no rhonchi Abdominal: Soft, NT, ND, bowel sounds + Extremities: no edema, no cyanosis Neuo: 5/5 strenghth x4, finger to nose nml. No facial droop    The results of significant diagnostics from this hospitalization (including imaging, microbiology, ancillary and laboratory) are listed below for reference.     Microbiology: Recent Results (from the past 240 hour(s))  Resp Panel by RT-PCR (Flu A&B, Covid) Nasopharyngeal Swab     Status: None   Collection Time: 03/17/20 10:09 PM   Specimen: Nasopharyngeal Swab; Nasopharyngeal(NP) swabs in vial transport medium  Result Value Ref Range Status   SARS Coronavirus 2 by RT PCR NEGATIVE NEGATIVE Final    Comment: (NOTE) SARS-CoV-2 target nucleic acids are NOT DETECTED.  The SARS-CoV-2 RNA is generally detectable in upper respiratory specimens during the acute phase of infection. The lowest concentration of SARS-CoV-2 viral copies this assay can detect is 138 copies/mL. A negative result does not preclude SARS-Cov-2 infection and should not be used as the sole basis for treatment or other patient management decisions. A negative result may occur with  improper specimen collection/handling, submission of specimen other than nasopharyngeal swab, presence of  viral mutation(s) within the areas targeted by this assay, and inadequate number of viral copies(<138 copies/mL). A negative result must be combined with clinical observations, patient history, and epidemiological information. The expected result is Negative.  Fact Sheet for Patients:  EntrepreneurPulse.com.au  Fact Sheet for Healthcare Providers:   IncredibleEmployment.be  This test is no t yet approved or cleared by the [place] FDA and  has been authorized for detection and/or diagnosis of SARS-CoV-2 by FDA under an Emergency Use Authorization (EUA). This EUA will remain  in effect (meaning this test can be used) for the duration of the COVID-19 declaration under Section 564(b)(1) of the Act, 21 U.S.C.section 360bbb-3(b)(1), unless the authorization is terminated  or revoked sooner.       Influenza A by PCR NEGATIVE NEGATIVE Final   Influenza B by PCR NEGATIVE NEGATIVE Final    Comment: (NOTE) The Xpert Xpress SARS-CoV-2/FLU/RSV plus assay is intended as an aid in the diagnosis of influenza from Nasopharyngeal swab specimens and should not be used as a sole basis for treatment. Nasal washings and aspirates are unacceptable for Xpert Xpress SARS-CoV-2/FLU/RSV testing.  Fact Sheet for Patients: EntrepreneurPulse.com.au  Fact Sheet for Healthcare Providers: IncredibleEmployment.be  This test is not yet approved or cleared by the [place] FDA and has been authorized for detection and/or diagnosis of SARS-CoV-2 by FDA under an Emergency Use Authorization (EUA). This EUA will remain in effect (meaning this test can be used) for the duration of the COVID-19 declaration under Section 564(b)(1) of the Act, 21 U.S.C. section 360bbb-3(b)(1), unless the authorization is terminated or revoked.  Performed at [facility], [place] [address], [place], [place] [address]      Labs: BNP (last 3 results) No results for input(s): BNP in the last 8760 hours. Basic Metabolic Panel: Recent Labs  Lab 03/13/20 1716 03/17/20 1743 03/17/20 1810 03/17/20 2209  NA 139 139 139  --   K 3.6 3.9 3.9  --   CL 106 104 104  --   CO2 25 25  --   --   GLUCOSE 115* 90 84  --   BUN 8 6* 6*  --   CREATININE 0.53 0.70 0.70 0.68  CALCIUM 9.1 9.5  --   --    Liver Function  Tests: Recent Labs  Lab 03/17/20 1743  AST 16  ALT 16  ALKPHOS 53  BILITOT 0.7  PROT 6.7  ALBUMIN 4.1   No results for input(s): LIPASE, AMYLASE in the last 168 hours. No results for input(s): AMMONIA in the last 168 hours. CBC: Recent Labs  Lab 03/13/20 1716 03/17/20 1743 03/17/20 1810 03/17/20 2209  WBC 9.1 8.7  --  8.3  NEUTROABS 4.8 4.4  --   --   HGB 12.2 12.5 12.9 12.8  HCT 35.6* 37.1 38.0 39.4  MCV 95.2 96.6  --  97.3  PLT 231 270  --  271   Cardiac Enzymes: No results for input(s): CKTOTAL, CKMB, CKMBINDEX, TROPONINI in the last 168 hours. BNP: Invalid input(s): POCBNP CBG: No results for input(s): GLUCAP in the last 168 hours. D-Dimer No results for input(s): DDIMER in the last 72 hours. Hgb A1c Recent Labs    03/18/20 0440  HGBA1C 5.7*   Lipid Profile Recent Labs    03/18/20 0440  CHOL 174  HDL 30*  LDLCALC UNABLE TO CALCULATE IF TRIGLYCERIDE OVER 400 mg/dL  TRIG 437*  CHOLHDL 5.8  LDLDIRECT 95.1   Thyroid function studies No results for input(s):  TSH, T4TOTAL, T3FREE, THYROIDAB in the last 72 hours.  Invalid input(s): FREET3 Anemia work up No results for input(s): VITAMINB12, FOLATE, FERRITIN, TIBC, IRON, RETICCTPCT in the last 72 hours. Urinalysis    Component Value Date/Time   COLORURINE YELLOW 05/20/2012 [place] 05/20/2012 1528   LABSPEC 1.014 05/20/2012 1528   PHURINE 5.5 05/20/2012 1528   GLUCOSEU NEGATIVE 05/20/2012 1528   HGBUR NEGATIVE 05/20/2012 [place] 05/20/2012 [place] 05/20/2012 1528   PROTEINUR NEGATIVE 05/20/2012 1528   UROBILINOGEN 0.2 05/20/2012 1528   NITRITE NEGATIVE 05/20/2012 1528   LEUKOCYTESUR TRACE (A) 05/20/2012 1528   Sepsis Labs Invalid input(s): PROCALCITONIN,  WBC,  LACTICIDVEN Microbiology Recent Results (from the past 240 hour(s))  Resp Panel by RT-PCR (Flu A&B, Covid) Nasopharyngeal Swab     Status: None   Collection Time: 03/17/20 10:09 PM    Specimen: Nasopharyngeal Swab; Nasopharyngeal(NP) swabs in vial transport medium  Result Value Ref Range Status   SARS Coronavirus 2 by RT PCR NEGATIVE NEGATIVE Final    Comment: (NOTE) SARS-CoV-2 target nucleic acids are NOT DETECTED.  The SARS-CoV-2 RNA is generally detectable in upper respiratory specimens during the acute phase of infection. The lowest concentration of SARS-CoV-2 viral copies this assay can detect is 138 copies/mL. A negative result does not preclude SARS-Cov-2 infection and should not be used as the sole basis for treatment or other patient management decisions. A negative result may occur with  improper specimen collection/handling, submission of specimen other than nasopharyngeal swab, presence of viral mutation(s) within the areas targeted by this assay, and inadequate number of viral copies(<138 copies/mL). A negative result must be combined with clinical observations, patient history, and epidemiological information. The expected result is Negative.  Fact Sheet for Patients:  EntrepreneurPulse.com.au  Fact Sheet for Healthcare Providers:  IncredibleEmployment.be  This test is no t yet approved or cleared by the [place] FDA and  has been authorized for detection and/or diagnosis of SARS-CoV-2 by FDA under an Emergency Use Authorization (EUA). This EUA will remain  in effect (meaning this test can be used) for the duration of the COVID-19 declaration under Section 564(b)(1) of the Act, 21 U.S.C.section 360bbb-3(b)(1), unless the authorization is terminated  or revoked sooner.       Influenza A by PCR NEGATIVE NEGATIVE Final   Influenza B by PCR NEGATIVE NEGATIVE Final    Comment: (NOTE) The Xpert Xpress SARS-CoV-2/FLU/RSV plus assay is intended as an aid in the diagnosis of influenza from Nasopharyngeal swab specimens and should not be used as a sole basis for treatment. Nasal washings and aspirates are  unacceptable for Xpert Xpress SARS-CoV-2/FLU/RSV testing.  Fact Sheet for Patients: EntrepreneurPulse.com.au  Fact Sheet for Healthcare Providers: IncredibleEmployment.be  This test is not yet approved or cleared by the [place] FDA and has been authorized for detection and/or diagnosis of SARS-CoV-2 by FDA under an Emergency Use Authorization (EUA). This EUA will remain in effect (meaning this test can be used) for the duration of the COVID-19 declaration under Section 564(b)(1) of the Act, 21 U.S.C. section 360bbb-3(b)(1), unless the authorization is terminated or revoked.  Performed at [facility], [place] [address] [place], [place] [address]      Time coordinating discharge: Over 30 minutes  SIGNED:   Nolberto Hanlon, MD  [facility] 03/18/2020, 2:42 PM Pager   If 7PM-7AM, please contact night-coverage [email or web address] Password [email or web address]

## 2020-03-18 NOTE — Consult Note (Signed)
NEUROLOGY CONSULTATION NOTE   Date of service: March 18, 2020 Patient Name: Deborah Peters MRN:  [id number] DOB:  05-04-1955 Reason for consult: "Stroke" _ _ _   _ __   _ __ _ _  __ __   _ __   __ _  History of Present Illness  Deborah Peters is a 64 y.o. female with PMH significant for diabetes, hypertension, hyperlipidemia, meralgia paresthetica, heart murmur, migraines who presents with right face, arm and leg numbness which has been ongoing for 4 days.  Last known well of 03/13/2020.  She endorses some difficulty with walking and has been dragging her right foot.  She initially thought that these were all related to her back issues but she discussed these with her primary care physician who ordered an MRI brain which was done today and demonstrated a left thalamic capsular stroke.  She denies any prior history of strokes.  She does smoke occasionally but is very committed to quitting.  Denies any family history of stroke in her parents or siblings.  NIH stroke scale: 0 MR S: 0 tPA: Out of the window. Thrombectomy: Out of the window.   ROS   Constitutional Denies weight loss, fever and chills.   HEENT Denies changes in vision and hearing.   Respiratory Denies SOB and cough.   CV Denies palpitations and CP   GI Denies abdominal pain, nausea, vomiting and diarrhea.   GU Denies dysuria and urinary frequency.   MSK Denies myalgia and joint pain.   Skin Denies rash and pruritus.   Neurological Denies headache and syncope.   Psychiatric Denies recent changes in mood. Denies anxiety and depression.    Past History   Past Medical History:  Diagnosis Date  . Allergy   . Chronic back pain   . Constipation due to opioid therapy   . DDD (degenerative disc disease), lumbar   . Depression   . Diabetes mellitus, type 2 ([place])   . Fatty liver   . GERD (gastroesophageal reflux disease)    past hx of   . Gestational diabetes    no meds as of 06-01-2016.  diet controlled  . Heart murmur    . Heart murmur   . Hyperlipidemia   . Hypertension    pt this pain related - id occasionally high   . IFG (impaired fasting glucose)   . Meralgia paraesthetica   . Migraines   . Post-menopausal   . Scoliosis   . Ulcer    years ago. gastric ulcer.   Past Surgical History:  Procedure Laterality Date  . ABDOMINAL HYSTERECTOMY     has her ovaries  . BACK SURGERY  2011   L5 S1  . COLONOSCOPY    . JOINT REPLACEMENT     L knee  . knee surgeries     x 8 before replacement   . POLYPECTOMY     HPP in 2008 DB   Family History  Problem Relation Age of Onset  . Cancer Maternal Grandmother   . Cancer Maternal Grandfather   . Colon polyps Sister   . Colon polyps Brother   . Stomach cancer Paternal Grandfather   . Esophageal cancer Neg Hx   . Rectal cancer Neg Hx   . Colon cancer Neg Hx   . Pancreatic cancer Neg Hx    Social History   Socioeconomic History  . Marital status: Married    Spouse name: Not on file  . Number of children: 2  .  Years of education: Not on file  . Highest education level: Not on file  Occupational History  . Occupation: disabled  Tobacco Use  . Smoking status: Former Research scientist (life sciences)  . Smokeless tobacco: Never Used  Vaping Use  . Vaping Use: Never used  Substance and Sexual Activity  . Alcohol use: Not Currently    Comment: Rare  . Drug use: No  . Sexual activity: Not on file  Other Topics Concern  . Not on file  Social History Narrative  . Not on file   Social Determinants of Health   Financial Resource Strain: Not on file  Food Insecurity: Not on file  Transportation Needs: Not on file  Physical Activity: Not on file  Stress: Not on file  Social Connections: Not on file   Allergies  Allergen Reactions  . Sulfa Antibiotics Anaphylaxis    Medications  (Not in a hospital admission)    Vitals   Vitals:   03/17/20 1742 03/17/20 1934 03/17/20 2000  BP: 134/69 (!) 141/75 (!) 163/93  Pulse: 81 79 71  Resp: 16 18 10   Temp: 98.7 F  (37.1 C) 98.2 F (36.8 C)   TempSrc: Oral Oral   SpO2: 98% 99% 100%     There is no height or weight on file to calculate BMI.  Physical Exam   General: Laying comfortably in bed; in no acute distress.  HENT: Normal oropharynx and mucosa. Normal external appearance of ears and nose.  Neck: Supple, no pain or tenderness  CV: No JVD. No peripheral edema.  Pulmonary: Symmetric Chest rise. Normal respiratory effort.  Abdomen: Soft to touch, non-tender.  Ext: No cyanosis, edema, or deformity  Skin: No rash. Normal palpation of skin.   Musculoskeletal: Normal digits and nails by inspection. No clubbing.   Neurologic Examination  Mental status/Cognition: Alert, oriented to self, place, month and year, good attention.  Speech/language: Fluent, comprehension intact, object naming intact, repetition intact.  Cranial nerves:   CN II Pupils equal and reactive to light, no VF deficits    CN III,IV,VI EOM intact, no gaze preference or deviation, no nystagmus    CN V normal sensation in V1, V2, and V3 segments bilaterally    CN VII no asymmetry, no nasolabial fold flattening    CN VIII normal hearing to speech    CN IX & X normal palatal elevation, no uvular deviation    CN XI 5/5 head turn and 5/5 shoulder shrug bilaterally    CN XII midline tongue protrusion    Motor:  Muscle bulk: normal, tone normal, pronator drift none tremor none Mvmt Root Nerve  Muscle Right Left Comments  SA C5/6 Ax Deltoid 5 5   EF C5/6 Mc Biceps 5 5   EE C6/7/8 Rad Triceps 5 5   WF C6/7 Med FCR 5 5   WE C7/8 PIN ECU 5 5   F Ab C8/T1 U ADM/FDI 4+ 5   HF L1/2/3 Fem Illopsoas 4+ 4+   KE L2/3/4 Fem Quad 5 5   DF L4/5 D Peron Tib Ant 5 5   PF S1/2 Tibial Grc/Sol 5 5    Reflexes:  Right Left Comments  Pectoralis      Biceps (C5/6) 2 2   Brachioradialis (C5/6) 2 2    Triceps (C6/7) 2 2    Patellar (L3/4) 2 2    Achilles (S1)      Hoffman      Plantar     Jaw jerk  Sensation:  Light touch Intact  throughout   Pin prick Intact throughout   Temperature    Vibration   Proprioception    Coordination/Complex Motor:  - Finger to Nose intact BL - Heel to shin intact BL - Rapid alternating movement with some incoordination in RUE - Gait: Deferred.  Labs   CBC:  Recent Labs  Lab 03/13/20 1716 03/17/20 1743 03/17/20 1810 03/17/20 2209  WBC 9.1 8.7  --  8.3  NEUTROABS 4.8 4.4  --   --   HGB 12.2 12.5 12.9 12.8  HCT 35.6* 37.1 38.0 39.4  MCV 95.2 96.6  --  97.3  PLT 231 270  --  [id number]    Basic Metabolic Panel:  Lab Results  Component Value Date   NA 139 03/17/2020   K 3.9 03/17/2020   CO2 25 03/17/2020   GLUCOSE 84 03/17/2020   BUN 6 (L) 03/17/2020   CREATININE 0.68 03/17/2020   CALCIUM 9.5 03/17/2020   GFRNONAA >60 03/17/2020   GFRAA >60 12/21/2017   Lipid Panel: No results found for: LDLCALC HgbA1c: No results found for: HGBA1C Urine Drug Screen: No results found for: LABOPIA, COCAINSCRNUR, LABBENZ, AMPHETMU, THCU, LABBARB  Alcohol Level No results found for: [facility]  MR angio head without contrast: No LVO.  MRI Brain: 1 cm acute to early subacute ischemic left thalamic capsular infarct.  Impression   Deborah Peters is a 64 y.o. female with PMH significant for diabetes, hypertension, hyperlipidemia, meralgia paresthetica, heart murmur, migraines who presents with left thalamic stroke. NIHSS of 0.  Recommendations   - Frequent Neuro checks per stroke unit protocol - Recommend Vascular imaging with MRA Angio Head without contrast and US Carotid doppler - Recommend obtaining TTE - Recommend obtaining Lipid panel with LDL - Please start statin if LDL > 70 - Recommend HbA1c - Antithrombotic - Aspirin 81mg  daily. - Recommend DVT ppx - SBP goal - aim for gradual normotension. - Recommend Telemetry monitoring for arrythmia - Recommend bedside swallow screen prior to PO intake. - Stroke education booklet - Recommend PT/OT/SLP  consult  ______________________________________________________________________   Thank you for the opportunity to take part in the care of this patient. If you have any further questions, please contact the neurology consultation attending.  Signed,  [place] Pager Number [phone] _ _ _   _ __   _ __ _ _  __ __   _ __   __ _

## 2020-03-18 NOTE — Progress Notes (Signed)
She isSTROKE TEAM PROGRESS NOTE   INTERVAL HISTORY Patient remains in the ED. I have personally reviewed history of present illness with the patient, electronic medical records and imaging films and PACS.  Deborah Peters with recurrent episode of right sided body paresthesias and MRI scan confirmed small left thalamic lacunar infarct.  MRI of the brain shows no large vessel stenosis or occlusion.  LDL cholesterol 95.1.  Hemoglobin A1c is 5.7.  Echocardiogram and carotid Dopplers are pending at this time.  Vitals:   03/18/20 0648 03/18/20 0700 03/18/20 0900 03/18/20 0903  BP: 118/71 132/71 137/74   Pulse: 71 68 79   Resp: (!) 32 17 16   Temp:    98.3 F (36.8 C)  TempSrc:    Oral  SpO2: 94% 94% 100%   Weight:      Height:       CBC:  Recent Labs  Lab 03/13/20 1716 03/17/20 1743 03/17/20 1810 03/17/20 2209  WBC 9.1 8.7  --  8.3  NEUTROABS 4.8 4.4  --   --   HGB 12.2 12.5 12.9 12.8  HCT 35.6* 37.1 38.0 39.4  MCV 95.2 96.6  --  97.3  PLT 231 270  --  734   Basic Metabolic Panel:  Recent Labs  Lab 03/13/20 1716 03/17/20 1743 03/17/20 1810 03/17/20 2209  NA 139 139 139  --   K 3.6 3.9 3.9  --   CL 106 104 104  --   CO2 25 25  --   --   GLUCOSE 115* 90 84  --   BUN 8 6* 6*  --   CREATININE 0.53 0.70 0.70 0.68  CALCIUM 9.1 9.5  --   --    Lipid Panel:  Recent Labs  Lab 03/18/20 0440  CHOL 174  TRIG 437*  HDL 30*  CHOLHDL 5.8  VLDL UNABLE TO CALCULATE IF TRIGLYCERIDE OVER 400 mg/dL  LDLCALC UNABLE TO CALCULATE IF TRIGLYCERIDE OVER 400 mg/dL   HgbA1c:  Recent Labs  Lab 03/18/20 0440  HGBA1C 5.7*   Urine Drug Screen: No results for input(s): LABOPIA, COCAINSCRNUR, LABBENZ, AMPHETMU, THCU, LABBARB in the last 168 hours.  Alcohol Level No results for input(s): ETH in the last 168 hours.  IMAGING past 24 hours MR ANGIO HEAD WO CONTRAST  Result Date: 03/17/2020 CLINICAL DATA:  Acute left thalamocapsular infarct. EXAM: MRA HEAD WITHOUT CONTRAST TECHNIQUE: Angiographic  images of the Circle of Willis were obtained using MRA technique without intravenous contrast. COMPARISON:  None. FINDINGS: 3 mm left paraophthalmic artery aneurysm is present. There is some atherosclerotic irregularity within the cavernous internal carotid arteries bilaterally without significant stenosis. The A1 and M1 segments are within normal limits. MCA bifurcations are normal. No significant proximal stenosis or occlusion is present. No additional aneurysms are present. The left vertebral artery is dominant. The left PICA origin is just below the field of view. Right PICA origin is within normal limits. Basilar artery is normal. Both posterior cerebral arteries originate from the basilar tip. PCA branch vessels are normal. IMPRESSION: 1. 3 mm left paraophthalmic artery aneurysm. 2. Mild atherosclerotic irregularity within the cavernous internal carotid arteries without significant stenosis. 3. Otherwise normal MRA Circle of Willis. Electronically Signed   By: San Morelle M.D.   On: 03/17/2020 23:11    PHYSICAL EXAM Pleasant middle-age mildly obese Caucasian lady not in distress. . Afebrile. Head is nontraumatic. Neck is supple without bruit.    Cardiac exam no murmur or gallop. Lungs are clear to auscultation.  Distal pulses are well felt. Neurological Exam ;  Awake  Alert oriented x 3. Normal speech and language.eye movements full without nystagmus.fundi were not visualized. Vision acuity and fields appear normal. Hearing is normal. Palatal movements are normal. Face symmetric. Tongue midline. Normal strength, tone, reflexes and coordination.  Mild diminished right hemibody touch pinprick sensation. Gait deferred.  ASSESSMENT/PLAN Deborah Peters is a 64 y.o. female with history of diabetes, hypertension, hyperlipidemia, meralgia paresthetica, heart murmur, migraines presenting with 4d hx of R face, arm and leg numbness with difficulty walking.     Stroke:   L thalamic infarct  secondary to small vessel disease    MRI  Subacute L thalamocapsular infarct. Remote L basal ganglia lacune. Small vessel disease.   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  No cardiac source of embolism.  LDL UTC d/t TG 467, direct LDL 95.1   HgbA1c 5.7  VTE prophylaxis - Lovenox 40 mg sq daily   aspirin 81 mg daily prior to admission, now on aspirin 325 mg daily. Decrease aspirin to 81 and add plavix 75. Continue DAPT x 3 weeks then plavix alone.   Therapy recommendations:   No OT or PT follow-up   disposition: Home  Hypertension  Stable . Permissive hypertension (OK if < 220/120) but gradually normalize in 5-7 days . Long-term BP goal normotensive  Hyperlipidemia  Home meds:  pravachol 20, zetia 10, fish oil  LDL UTC d/t TG 467, direct LDL 95.1, goal < 70  Now on Lipitor 80  Has tried multiple statins w/ Dr. Abner Greenspan - wants to stay on pravachol at d/c   Continue statin at discharge  Diabetes type II Controlled  HgbA1c 5.7, goal < 7.0  Diet controlled  Other Stroke Risk Factors  Cigarette smoker, advised to stop smoking  Hx ALCOHOL use   Migraines  Other Active Problems  Meralgia paresthetica  Heart murmur  Chronic low back pain  Hospital day # 0 Patient presented with recurrent episode of right body paresthesias secondary to left thalamic lacunar infarct from small vessel disease.  Recommend aspirin Plavix for 3 weeks followed by Plavix alone and aggressive risk factor modification.  Agree with changing pravastatin to Lipitor 80 mg daily.  May need consideration for changing to PCSK9 inhibitor injections as an outpatient if LDL remains suboptimally controlled.  Greater than 50% time during this 35-minute visit was spent in counseling and coordination of care about her lacunar stroke and discussion about stroke prevention treatment and answering  questions. Antony Contras, MD  To contact Stroke Continuity provider, please refer to [email or web address] After hours, contact General Neurology

## 2020-03-18 NOTE — ED Notes (Signed)
Pt ambulated to bathroom with assistance.

## 2020-03-18 NOTE — Evaluation (Signed)
Physical Therapy Evaluation and Discharge Patient Details Name: Deborah Peters MRN: [id number] DOB: 1955/08/12 Today's Date: 03/18/2020   History of Present Illness  This 64 y.o. female admitted with intermittent Rt UE and Rt LE as well as facial numbness and tingling as well as intermittent Rt LE weakness.  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  PMH includes:  ulcer, scoliosis, migraines, HTN, gestational diabetes, DM, depression, lumbar DDD; s/p Lt TKA, s/p back surgery L5 S1  Clinical Impression  Patient evaluated by Physical Therapy with no further acute PT needs identified. All education has been completed and the patient has no further questions. Pt with increased back pain, but reports this is baseline for her. Was able to perform SLS to adjust socks in standing and was overall supervision for safety. No LOB noted. Pt only reporting mild decrease in sensation in RLE. Given pt is at baseline, do not feel she will need further acute PT services. See below for any follow-up Physical Therapy or equipment needs. PT is signing off. Thank you for this referral. If needs change, please re-consult.      Follow Up Recommendations No PT follow up    Equipment Recommendations  None recommended by PT    Recommendations for Other Services       Precautions / Restrictions Precautions Precautions: Fall Restrictions Weight Bearing Restrictions: No      Mobility  Bed Mobility Overal bed mobility: Needs Assistance Bed Mobility: Supine to Sit     Supine to sit: Supervision     General bed mobility comments: Supervision for safety.    Transfers Overall transfer level: Needs assistance Equipment used: None Transfers: Sit to/from Stand Sit to Stand: Supervision         General transfer comment: Supervision for safety.  Ambulation/Gait Ambulation/Gait assistance: Supervision Gait  Distance (Feet): 50 Feet Assistive device: None Gait Pattern/deviations: Antalgic;Decreased stride length Gait velocity: Decreased   General Gait Details: Pt with increased back pain, which limited ambulation distance. Required standing rest X1, however, pt reports this is baseline. Supervision for safety  Stairs            Wheelchair Mobility    Modified Rankin (Stroke Patients Only) Modified Rankin (Stroke Patients Only) Pre-Morbid Rankin Score: No significant disability Modified Rankin: No significant disability     Balance Overall balance assessment: Mild deficits observed, not formally tested                                           Pertinent Vitals/Pain Pain Assessment: 0-10 Pain Score: 7  Pain Location: back Pain Descriptors / Indicators: Aching;Grimacing;Guarding Pain Intervention(s): Limited activity within patient's tolerance;Monitored during session;Repositioned    Home Living Family/patient expects to be discharged to:: Private residence Living Arrangements: Children;Spouse/significant other Available Help at Discharge: Family Type of Home: House Home Access: Stairs to enter Entrance Stairs-Rails: None Entrance Stairs-Number of Steps: 1 Home Layout: Two level Home Equipment: Grab bars - tub/shower;Shower seat - built in;Crutches      Prior Function Level of Independence: Independent               Hand Dominance        Extremity/Trunk Assessment   Upper Extremity Assessment Upper Extremity Assessment: Defer to OT evaluation    Lower Extremity Assessment Lower Extremity Assessment: RLE deficits/detail  RLE Deficits / Details: Reports mild decrease in sensation.    Cervical / Trunk Assessment Cervical / Trunk Assessment: Other exceptions Cervical / Trunk Exceptions: back pain at baseline  Communication   Communication: No difficulties  Cognition Arousal/Alertness: Awake/alert Behavior During Therapy: WFL for tasks  assessed/performed Overall Cognitive Status: Within Functional Limits for tasks assessed                                        General Comments      Exercises     Assessment/Plan    PT Assessment Patent does not need any further PT services  PT Problem List         PT Treatment Interventions      PT Goals (Current goals can be found in the Care Plan section)  Acute Rehab PT Goals Patient Stated Goal: to decrease back pain PT Goal Formulation: With patient Time For Goal Achievement: 04/02/20 Potential to Achieve Goals: Good    Frequency     Barriers to discharge        Co-evaluation               AM-PAC PT "6 Clicks" Mobility  Outcome Measure Help needed turning from your back to your side while in a flat bed without using bedrails?: None Help needed moving from lying on your back to sitting on the side of a flat bed without using bedrails?: None Help needed moving to and from a bed to a chair (including a wheelchair)?: None Help needed standing up from a chair using your arms (e.g., wheelchair or bedside chair)?: None Help needed to walk in hospital room?: None Help needed climbing 3-5 steps with a railing? : A Little 6 Click Score: 23    End of Session Equipment Utilized During Treatment: Gait belt Activity Tolerance: Patient limited by pain Patient left: in bed;with call bell/phone within reach (sitting edge of stretcher in ED) Nurse Communication: Mobility status PT Visit Diagnosis: Other symptoms and signs involving the nervous system (R29.898);Pain Pain - part of body:  (back)    Time: 1035-1100 PT Time Calculation (min) (ACUTE ONLY): 25 min   Charges:   PT Evaluation $PT Eval Low Complexity: 1 Low PT Treatments $Gait Training: 8-22 mins        Cindee Salt, DPT  Acute Rehabilitation Services  Pager: [phone] Office: [phone]   Deborah Peters 03/18/2020, 11:22 AM

## 2020-03-21 ENCOUNTER — Other Ambulatory Visit: Payer: Self-pay

## 2020-03-21 ENCOUNTER — Emergency Department (HOSPITAL_COMMUNITY)
Admission: EM | Admit: 2020-03-21 | Discharge: 2020-03-21 | Disposition: A | Discharge status: Home / Self Care | Payer: PPO | Attending: Emergency Medicine | Admitting: Emergency Medicine

## 2020-03-21 ENCOUNTER — Emergency Department (HOSPITAL_COMMUNITY): Payer: PPO

## 2020-03-21 DIAGNOSIS — Z7902 Long term (current) use of antithrombotics/antiplatelets: Secondary | ICD-10-CM | POA: Insufficient documentation

## 2020-03-21 DIAGNOSIS — Z7982 Long term (current) use of aspirin: Secondary | ICD-10-CM | POA: Diagnosis not present

## 2020-03-21 DIAGNOSIS — I639 Cerebral infarction, unspecified: Secondary | ICD-10-CM | POA: Insufficient documentation

## 2020-03-21 DIAGNOSIS — Z0000 Encounter for general adult medical examination without abnormal findings: Secondary | ICD-10-CM

## 2020-03-21 DIAGNOSIS — Z87891 Personal history of nicotine dependence: Secondary | ICD-10-CM | POA: Diagnosis not present

## 2020-03-21 DIAGNOSIS — R200 Anesthesia of skin: Secondary | ICD-10-CM

## 2020-03-21 DIAGNOSIS — E119 Type 2 diabetes mellitus without complications: Secondary | ICD-10-CM | POA: Insufficient documentation

## 2020-03-21 DIAGNOSIS — I10 Essential (primary) hypertension: Secondary | ICD-10-CM | POA: Insufficient documentation

## 2020-03-21 DIAGNOSIS — Z96652 Presence of left artificial knee joint: Secondary | ICD-10-CM | POA: Diagnosis not present

## 2020-03-21 DIAGNOSIS — R531 Weakness: Secondary | ICD-10-CM | POA: Diagnosis present

## 2020-03-21 DIAGNOSIS — Z79899 Other long term (current) drug therapy: Secondary | ICD-10-CM | POA: Diagnosis not present

## 2020-03-21 DIAGNOSIS — R29818 Other symptoms and signs involving the nervous system: Secondary | ICD-10-CM | POA: Diagnosis not present

## 2020-03-21 LAB — DIFFERENTIAL
Abs Immature Granulocytes: 0.02 10*3/uL (ref 0.00–0.07)
Basophils Absolute: 0 10*3/uL (ref 0.0–0.1)
Basophils Relative: 0 %
Eosinophils Absolute: 0.1 10*3/uL (ref 0.0–0.5)
Eosinophils Relative: 1 %
Immature Granulocytes: 0 %
Lymphocytes Relative: 38 %
Lymphs Abs: 3 10*3/uL (ref 0.7–4.0)
Monocytes Absolute: 0.5 10*3/uL (ref 0.1–1.0)
Monocytes Relative: 7 %
Neutro Abs: 4.2 10*3/uL (ref 1.7–7.7)
Neutrophils Relative %: 54 %

## 2020-03-21 LAB — I-STAT CHEM 8, ED
BUN: 3 mg/dL — ABNORMAL LOW (ref 8–23)
Calcium, Ion: 1.14 mmol/L — ABNORMAL LOW (ref 1.15–1.40)
Chloride: 105 mmol/L (ref 98–111)
Creatinine, Ser: 0.6 mg/dL (ref 0.44–1.00)
Glucose, Bld: 124 mg/dL — ABNORMAL HIGH (ref 70–99)
HCT: 37 % (ref 36.0–46.0)
Hemoglobin: 12.6 g/dL (ref 12.0–15.0)
Potassium: 3.9 mmol/L (ref 3.5–5.1)
Sodium: 139 mmol/L (ref 135–145)
TCO2: 21 mmol/L — ABNORMAL LOW (ref 22–32)

## 2020-03-21 LAB — COMPREHENSIVE METABOLIC PANEL
ALT: 15 U/L (ref 0–44)
AST: 17 U/L (ref 15–41)
Albumin: 3.8 g/dL (ref 3.5–5.0)
Alkaline Phosphatase: 46 U/L (ref 38–126)
Anion gap: 10 (ref 5–15)
BUN: <5 mg/dL — ABNORMAL LOW (ref 8–23)
CO2: 22 mmol/L (ref 22–32)
Calcium: 9.1 mg/dL (ref 8.9–10.3)
Chloride: 106 mmol/L (ref 98–111)
Creatinine, Ser: 0.72 mg/dL (ref 0.44–1.00)
GFR, Estimated: >60 mL/min (ref >60)
Glucose, Bld: 134 mg/dL — ABNORMAL HIGH (ref 70–99)
Potassium: 3.9 mmol/L (ref 3.5–5.1)
Sodium: 138 mmol/L (ref 135–145)
Total Bilirubin: 0.4 mg/dL (ref 0.3–1.2)
Total Protein: 6.2 g/dL — ABNORMAL LOW (ref 6.5–8.1)

## 2020-03-21 LAB — CBC
HCT: 37.3 % (ref 36.0–46.0)
Hemoglobin: 12 g/dL (ref 12.0–15.0)
MCH: 31.2 pg (ref 26.0–34.0)
MCHC: 32.2 g/dL (ref 30.0–36.0)
MCV: 96.9 fL (ref 80.0–100.0)
Platelets: 246 10*3/uL (ref 150–400)
RBC: 3.85 MIL/uL — ABNORMAL LOW (ref 3.87–5.11)
RDW: 13 % (ref 11.5–15.5)
WBC: 7.9 10*3/uL (ref 4.0–10.5)
nRBC: 0 % (ref 0.0–0.2)

## 2020-03-21 LAB — APTT: aPTT: 31 seconds (ref 24–36)

## 2020-03-21 LAB — CBG MONITORING, ED: Glucose-Capillary: 126 mg/dL — ABNORMAL HIGH (ref 70–99)

## 2020-03-21 LAB — PROTIME-INR
INR: 1 (ref 0.8–1.2)
Prothrombin Time: 12.5 seconds (ref 11.4–15.2)

## 2020-03-21 LAB — ETHANOL: Alcohol, Ethyl (B): <10 mg/dL (ref <10)

## 2020-03-21 MED ORDER — DIAZEPAM 5 MG PO TABS: 5.0000 mg | ORAL_TABLET | Freq: Three times a day (TID) | ORAL | 0 refills | Status: AC | PRN

## 2020-03-21 MED ORDER — DIAZEPAM 5 MG PO TABS: 5.0000 mg | ORAL_TABLET | Freq: Three times a day (TID) | ORAL | 0 refills | Status: DC | PRN

## 2020-03-21 NOTE — ED Notes (Signed)
Patient presents to the ED Code Stroke d/t numbness that started on the L side face, arm, leg at 1050 today.  Patient had recent stroke 12/15, with minimal R side weakness.   Patient A0x4, ambulatory at baseline, and to follow all commands.   Dry CT completed, possible MRI.  NIH-- 1  VSS   Denies pain

## 2020-03-21 NOTE — ED Provider Notes (Signed)
[facility] DEPARTMENT Provider Note   CSN: [id number] Arrival date & time: 03/21/20  1247     History Chief Complaint  Patient presents with  . Code Stroke    Deborah Peters is a 64 y.o. female.  HPI   64 year old female with past medical history of previous CVA with residual right-sided weakness on aspirin and Plavix presents the emergency department with concern for numbness to the left side of her body.  Patient states about 2 hours ago while she was drinking coffee her entire left side of her face including her left hand and foot went numb.  Patient denies any facial droop, speech difficulty, confusion, headache, ataxia, weakness.  This is improved but still present on arrival.  Denies any recent fever or illness.  Past Medical History:  Diagnosis Date  . Allergy   . Chronic back pain   . Constipation due to opioid therapy   . DDD (degenerative disc disease), lumbar   . Depression   . Diabetes mellitus, type 2 ([place])   . Fatty liver   . GERD (gastroesophageal reflux disease)    past hx of   . Gestational diabetes    no meds as of 06-01-2016.  diet controlled  . Heart murmur   . Heart murmur   . Hyperlipidemia   . Hypertension    pt this pain related - id occasionally high   . IFG (impaired fasting glucose)   . Meralgia paraesthetica   . Migraines   . Post-menopausal   . Scoliosis   . Ulcer    years ago. gastric ulcer.    Patient Active Problem List   Diagnosis Date Noted  . Acute CVA (cerebrovascular accident) ([place]) 03/17/2020  . HLD (hyperlipidemia) 03/17/2020  . Abdominal pain, epigastric 01/14/2018  . NSAID long-term use 01/14/2018  . Knee MCL sprain 10/07/2012  . Right ankle sprain 10/07/2012    Past Surgical History:  Procedure Laterality Date  . ABDOMINAL HYSTERECTOMY     has her ovaries  . BACK SURGERY  2011   L5 S1  . COLONOSCOPY    . JOINT REPLACEMENT     L knee  . knee surgeries     x 8 before replacement   .  POLYPECTOMY     HPP in 2008 DB     OB History   No obstetric history on file.     Family History  Problem Relation Age of Onset  . Cancer Maternal Grandmother   . Cancer Maternal Grandfather   . Colon polyps Sister   . Colon polyps Brother   . Stomach cancer Paternal Grandfather   . Esophageal cancer Neg Hx   . Rectal cancer Neg Hx   . Colon cancer Neg Hx   . Pancreatic cancer Neg Hx     Social History   Tobacco Use  . Smoking status: Former Research scientist (life sciences)  . Smokeless tobacco: Never Used  Vaping Use  . Vaping Use: Never used  Substance Use Topics  . Alcohol use: Not Currently    Comment: Rare  . Drug use: No    Home Medications Prior to Admission medications   Medication Sig Start Date End Date Taking? Authorizing Provider  albuterol (VENTOLIN HFA) 108 (90 Base) MCG/ACT inhaler Inhale 2 puffs into the lungs every 6 (six) hours as needed for wheezing or shortness of breath. 11/08/19  Yes [provider]  ascorbic acid (VITAMIN C) 500 MG tablet Take 500 mg by mouth daily.  Yes [provider]  aspirin EC 81 MG tablet Take 81 mg by mouth daily.   Yes [provider]  atorvastatin (LIPITOR) 80 MG tablet Take 1 tablet (80 mg total) by mouth at bedtime. 03/18/20 04/17/20 Yes Nolberto Hanlon, MD  benzonatate (TESSALON) 100 MG capsule Take 100 mg by mouth 3 (three) times daily as needed for cough. 03/01/20  Yes [provider]  clopidogrel (PLAVIX) 75 MG tablet Take 1 tablet (75 mg total) by mouth daily. 03/19/20 04/18/20 Yes Nolberto Hanlon, MD  diazepam (VALIUM) 5 MG tablet Take 5 mg by mouth every 8 (eight) hours as needed for anxiety or muscle spasms.   Yes [provider]  DULoxetine (CYMBALTA) 60 MG capsule Take 60 mg by mouth daily.   Yes [provider]  ezetimibe (ZETIA) 10 MG tablet Take 10 mg by mouth daily. 10/23/17  Yes [provider]  fish oil-omega-3 fatty acids 1000 MG capsule Take 2 g by mouth 2 (two) times daily.     Yes [provider]  gabapentin (NEURONTIN) 400 MG capsule Take 400 mg by mouth 3 (three) times daily. 03/12/20  Yes [provider]  magnesium 30 MG tablet Take 30 mg by mouth 2 (two) times daily.   Yes [provider]  milk thistle 175 MG tablet Take 175 mg by mouth daily.   Yes [provider]  OVER THE COUNTER MEDICATION Take 1 capsule by mouth daily. Tumeric   Yes [provider]  OVER THE COUNTER MEDICATION Take 1 tablet by mouth daily. Cinnamon daily   Yes [provider]  OVER THE COUNTER MEDICATION Take 1 tablet by mouth at bedtime. alteril natural sleep aide at bedtime   Yes [provider]  Oxycodone HCl 10 MG TABS Take 10 mg by mouth 5 (five) times daily. 11/23/17  Yes [provider]  pantoprazole (PROTONIX) 40 MG tablet Take 1 tablet (40 mg total) by mouth 2 (two) times daily. Patient taking differently: Take 40 mg by mouth daily as needed (indigestion). 01/24/18  Yes Nandigam, Venia Minks, MD  promethazine (PHENERGAN) 25 MG tablet Take 25 mg by mouth every 6 (six) hours as needed for nausea or vomiting. 03/15/20  Yes [provider]  VITAMIN E PO Take 1 capsule by mouth daily.   Yes [provider]  zolpidem (AMBIEN CR) 6.25 MG CR tablet Take 6.25 mg by mouth at bedtime.  12/20/17  Yes [provider]    Allergies    Sulfa antibiotics  Review of Systems   Review of Systems  Constitutional: Negative for chills and fever.  HENT: Negative for congestion.   Eyes: Negative for visual disturbance.  Respiratory: Negative for shortness of breath.   Cardiovascular: Negative for chest pain.  Gastrointestinal: Negative for abdominal pain, diarrhea and vomiting.  Genitourinary: Negative for dysuria.  Skin: Negative for rash.  Neurological: Positive for tremors and numbness. Negative for dizziness, syncope, facial asymmetry, speech difficulty, weakness, light-headedness and headaches.     Physical Exam Updated Vital Signs BP 137/65 (BP Location: Right Arm)   Pulse 82   Temp 99.4 F (37.4 C) (Oral)   Resp 20   Wt 70.3 kg   SpO2 97%   BMI 25.01 kg/m   Physical Exam Vitals and nursing note reviewed.  Constitutional:      Appearance: Normal appearance.  HENT:     Head: Normocephalic.     Mouth/Throat:     Mouth: Mucous membranes are moist.  Cardiovascular:  Rate and Rhythm: Normal rate.  Pulmonary:     Effort: Pulmonary effort is normal. No respiratory distress.  Abdominal:     Palpations: Abdomen is soft.     Tenderness: There is no abdominal tenderness.  Skin:    General: Skin is warm.  Neurological:     General: No focal deficit present.     Mental Status: She is alert and oriented to person, place, and time. Mental status is at baseline.     Cranial Nerves: No cranial nerve deficit.     Comments: No significant sensory discrepancy on exam  Psychiatric:     Comments: anxious     ED Results / Procedures / Treatments   Labs (all labs ordered are listed, but only abnormal results are displayed) Labs Reviewed  CBC - Abnormal; Notable for the following components:      Result Value   RBC 3.85 (*)    All other components within normal limits  COMPREHENSIVE METABOLIC PANEL - Abnormal; Notable for the following components:   Glucose, Bld 134 (*)    BUN <5 (*)    Total Protein 6.2 (*)    All other components within normal limits  CBG MONITORING, ED - Abnormal; Notable for the following components:   Glucose-Capillary 126 (*)    All other components within normal limits  I-STAT CHEM 8, ED - Abnormal; Notable for the following components:   BUN 3 (*)    Glucose, Bld 124 (*)    Calcium, Ion 1.14 (*)    TCO2 21 (*)    All other components within normal limits  ETHANOL  PROTIME-INR  APTT  DIFFERENTIAL  RAPID URINE DRUG SCREEN, HOSP PERFORMED  URINALYSIS, ROUTINE W REFLEX MICROSCOPIC    EKG EKG Interpretation  Date/Time:  Sunday  March 21 2020 13:23:51 EST Ventricular Rate:  78 PR Interval:    QRS Duration: 85 QT Interval:  363 QTC Calculation: 414 R Axis:   79 Text Interpretation: Sinus rhythm Low voltage, precordial leads RSR' in V1 or V2, probably normal variant Confirmed by Quintella Reichert [phone]) on 03/21/2020 1:36:06 PM   Radiology CT HEAD CODE STROKE WO CONTRAST  Result Date: 03/21/2020 CLINICAL DATA:  Code stroke. Neuro deficit, acute stroke suspected. Left-sided face, hand/foot numbness. EXAM: CT HEAD WITHOUT CONTRAST TECHNIQUE: Contiguous axial images were obtained from the base of the skull through the vertex without intravenous contrast. COMPARISON:  MRI December 21, 21. FINDINGS: Brain: No evidence of acute large vascular territory infarction, hemorrhage, hydrocephalus, extra-axial collection or mass lesion/mass effect. Remote left corona radiata infarct. Recent left thalamocapsular infarct was better characterized on MRI from 03/16/2020. Vascular: Calcific atherosclerosis. No hyperdense vessel identified. Skull: No acute fracture. Sinuses/Orbits: Sinuses are clear.  Unremarkable orbits. Other: No mastoid effusions. ASPECTS [facility] Stroke Program Early CT Score) total score (0-10 with 10 being normal): 10. IMPRESSION: 1. No evidence of acute intracranial abnormality.  ASPECTS is 10. 2. Remote left left corona radiata infarct. Recent left thalamocapsular infarct was better characterized on MRI from 03/16/2020. Code stroke imaging results were communicated on 03/21/2020 at 1:20 pm to provider Dr. Leonie Man via secure text paging. Electronically Signed   By: Margaretha Sheffield MD   On: 03/21/2020 13:22    Procedures Procedures (including critical care time)  Medications Ordered in ED Medications - No data to display  ED Course  I have reviewed the triage vital signs and the nursing notes.  Pertinent labs & imaging results that were available during my care of the  patient were reviewed by me and considered  in my medical decision making (see chart for details).    MDM Rules/Calculators/A&P                          Patient evaluated as a stroke page and EMS triage. Stroke neurologist Dr. Leonie Man at bedside. Initial NIH is subjectively 1 for sensory change, not a TPA candidate with previous stroke and low NIH, no other focal neurologic findings on exam. Head CT noncon shows no bleeding, identifies old lacunar stroke. On re evaluation, symptoms are improved, neurology does not feel her initial sensory pattern is concerning for acute stroke. She is already optimized on aspirin and plavix. Plan for MRI as an outpatient and follow up in the office. With resolved symptoms no indication for emergent imaging or treatment at this time. Patient will be discharged and treated as an outpatient.  Discharge plan discussed, patient verbalizes understanding and agreement.   Final Clinical Impression(s) / ED Diagnoses Final diagnoses:  None    Rx / DC Orders ED Discharge Orders    None       Lorelle Gibbs, DO 03/21/20 1415

## 2020-03-21 NOTE — ED Notes (Signed)
Patient discharged, vetrbalized understanding of DC instructions.  ambulated to [place] with husband  0/ 10 pain  PIV removed

## 2020-03-21 NOTE — Discharge Instructions (Signed)
You have been seen and discharged from the emergency department.  Follow-up with your primary provider and neurology for reevaluation. Call tomorrow morning for appointments. Take new prescriptions and home medications as prescribed. If you have any worsening symptoms or further concerns for health please return to emergency department for further evaluation.

## 2020-03-21 NOTE — Consult Note (Signed)
Reason for Consult: Code stroke Referring Physician: Dr. Haynes Peters is an 64 y.o. female.  HPI: Deborah Peters is a pleasant 64 year old Caucasian lady with past medical history of hypertension lipidemia, prediabetes, tobacco abuse, chronic back pain with recent left thalamic infarct on 03/17/2020 was recently discharged on aspirin and Plavix presents with sudden onset of left face numbness followed by left hand and feet numbness at 10:50 AM.  She denies any weakness, slurred speech but does admit to some chest pain as well as mild headache.  She feels the numbness seems to be improving now.  She does have residual right body numbness from her recent stroke significant persistent numbness, current symptoms which appear to be resolving.  She states she is noncompliant with aspirin and Plavix and has been tolerating them without bruising or bleeding.  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  And carotid ultrasound showed no large vessel stenosis or occlusion.  Echocardiogram was unremarkable LDL cholesterol 95.1 mg percent and hemoglobin A1c was 5.7.  She states she is quit smoking after his stroke is made lifestyle changes and started healthy eating. Last seen normal at 10:50 AM 03/21/2020 IV TPA considered no due to recent stroke 1 week ago and symptoms too mild to treat Mechanical thrombectomy considered no as no clinical evidence to suggest LVO Past Medical History:  Diagnosis Date  . Allergy   . Chronic back pain   . Constipation due to opioid therapy   . DDD (degenerative disc disease), lumbar   . Depression   . Diabetes mellitus, type 2 ([place])   . Fatty liver   . GERD (gastroesophageal reflux disease)    past hx of   . Gestational diabetes    no meds as of 06-01-2016.  diet controlled  . Heart murmur   . Heart  murmur   . Hyperlipidemia   . Hypertension    pt this pain related - id occasionally high   . IFG (impaired fasting glucose)   . Meralgia paraesthetica   . Migraines   . Post-menopausal   . Scoliosis   . Ulcer    years ago. gastric ulcer.    Past Surgical History:  Procedure Laterality Date  . ABDOMINAL HYSTERECTOMY     has her ovaries  . BACK SURGERY  2011   L5 S1  . COLONOSCOPY    . JOINT REPLACEMENT     L knee  . knee surgeries     x 8 before replacement   . POLYPECTOMY     HPP in 2008 DB    Family History  Problem Relation Age of Onset  . Cancer Maternal Grandmother   . Cancer Maternal Grandfather   . Colon polyps Sister   . Colon polyps Brother   . Stomach cancer Paternal Grandfather   . Esophageal cancer Neg Hx   . Rectal cancer Neg Hx   . Colon cancer Neg Hx   . Pancreatic cancer Neg Hx     Social History:  reports that she has quit smoking. She has never used smokeless tobacco. She reports previous alcohol use. She reports that she does not use drugs.  Allergies:  Allergies  Allergen Reactions  . Sulfa Antibiotics Anaphylaxis    Medications: I have reviewed the patient's current medications.  Results for orders placed or performed during the hospital  encounter of 03/21/20 (from the past 48 hour(s))  CBG monitoring, ED     Status: Abnormal   Collection Time: 03/21/20  1:00 PM  Result Value Ref Range   Glucose-Capillary 126 (H) 70 - 99 mg/dL    Comment: Glucose reference range applies only to samples taken after fasting for at least 8 hours.  Ethanol     Status: None   Collection Time: 03/21/20  1:04 PM  Result Value Ref Range   Alcohol, Ethyl (B) <10 <10 mg/dL    Comment: (NOTE) Lowest detectable limit for serum alcohol is 10 mg/dL.  For medical purposes only. Performed at [facility], [place] [address], [place], [place] [address]   Protime-INR     Status: None   Collection Time: 03/21/20  1:04 PM  Result Value Ref Range   Prothrombin  Time 12.5 11.4 - 15.2 seconds   INR 1.0 0.8 - 1.2    Comment: (NOTE) INR goal varies based on device and disease states. Performed at [facility], [place] [address] [place], [place] [address]   APTT     Status: None   Collection Time: 03/21/20  1:04 PM  Result Value Ref Range   aPTT 31 24 - 36 seconds    Comment: Performed at [facility] [address], [place], [place] [address]  CBC     Status: Abnormal   Collection Time: 03/21/20  1:04 PM  Result Value Ref Range   WBC 7.9 4.0 - 10.5 K/uL   RBC 3.85 (L) 3.87 - 5.11 MIL/uL   Hemoglobin 12.0 12.0 - 15.0 g/dL   HCT 37.3 36.0 - 46.0 %   MCV 96.9 80.0 - 100.0 fL   MCH 31.2 26.0 - 34.0 pg   MCHC 32.2 30.0 - 36.0 g/dL   RDW 13.0 11.5 - 15.5 %   Platelets 246 150 - 400 K/uL   nRBC 0.0 0.0 - 0.2 %    Comment: Performed at [facility], [place] [address], [place], [place] [address]  Differential     Status: None   Collection Time: 03/21/20  1:04 PM  Result Value Ref Range   Neutrophils Relative % 54 %   Neutro Abs 4.2 1.7 - 7.7 K/uL   Lymphocytes Relative 38 %   Lymphs Abs 3.0 0.7 - 4.0 K/uL   Monocytes Relative 7 %   Monocytes Absolute 0.5 0.1 - 1.0 K/uL   Eosinophils Relative 1 %   Eosinophils Absolute 0.1 0.0 - 0.5 K/uL   Basophils Relative 0 %   Basophils Absolute 0.0 0.0 - 0.1 K/uL   Immature Granulocytes 0 %   Abs Immature Granulocytes 0.02 0.00 - 0.07 K/uL    Comment: Performed at [facility] [address] [place], [place] [address]  Comprehensive metabolic panel     Status: Abnormal   Collection Time: 03/21/20  1:04 PM  Result Value Ref Range   Sodium 138 135 - 145 mmol/L   Potassium 3.9 3.5 - 5.1 mmol/L   Chloride 106 98 - 111 mmol/L   CO2 22 22 - 32 mmol/L   Glucose, Bld 134 (H) 70 - 99 mg/dL    Comment: Glucose reference range applies only to samples taken after fasting for at least 8 hours.   BUN <5 (L) 8 - 23 mg/dL   Creatinine, Ser 0.72 0.44 - 1.00 mg/dL   Calcium 9.1 8.9 - 10.3  mg/dL   Total Protein 6.2 (L) 6.5 -  8.1 g/dL   Albumin 3.8 3.5 - 5.0 g/dL   AST 17 15 - 41 U/L   ALT 15 0 - 44 U/L   Alkaline Phosphatase 46 38 - 126 U/L   Total Bilirubin 0.4 0.3 - 1.2 mg/dL   GFR, Estimated >60 >60 mL/min    Comment: (NOTE) Calculated using the CKD-EPI Creatinine Equation (2021)    Anion gap 10 5 - 15    Comment: Performed at [facility] [address], [place], [place] [address]  I-stat chem 8, ED     Status: Abnormal   Collection Time: 03/21/20  1:07 PM  Result Value Ref Range   Sodium 139 135 - 145 mmol/L   Potassium 3.9 3.5 - 5.1 mmol/L   Chloride 105 98 - 111 mmol/L   BUN 3 (L) 8 - 23 mg/dL   Creatinine, Ser 0.60 0.44 - 1.00 mg/dL   Glucose, Bld 124 (H) 70 - 99 mg/dL    Comment: Glucose reference range applies only to samples taken after fasting for at least 8 hours.   Calcium, Ion 1.14 (L) 1.15 - 1.40 mmol/L   TCO2 21 (L) 22 - 32 mmol/L   Hemoglobin 12.6 12.0 - 15.0 g/dL   HCT 37.0 36.0 - 46.0 %    CT HEAD CODE STROKE WO CONTRAST  Result Date: 03/21/2020 CLINICAL DATA:  Code stroke. Neuro deficit, acute stroke suspected. Left-sided face, hand/foot numbness. EXAM: CT HEAD WITHOUT CONTRAST TECHNIQUE: Contiguous axial images were obtained from the base of the skull through the vertex without intravenous contrast. COMPARISON:  MRI December 21, 21. FINDINGS: Brain: No evidence of acute large vascular territory infarction, hemorrhage, hydrocephalus, extra-axial collection or mass lesion/mass effect. Remote left corona radiata infarct. Recent left thalamocapsular infarct was better characterized on MRI from 03/16/2020. Vascular: Calcific atherosclerosis. No hyperdense vessel identified. Skull: No acute fracture. Sinuses/Orbits: Sinuses are clear.  Unremarkable orbits. Other: No mastoid effusions. ASPECTS [facility] Stroke Program Early CT Score) total score (0-10 with 10 being normal): 10. IMPRESSION: 1. No evidence of acute intracranial abnormality.  ASPECTS  is 10. 2. Remote left left corona radiata infarct. Recent left thalamocapsular infarct was better characterized on MRI from 03/16/2020. Code stroke imaging results were communicated on 03/21/2020 at 1:20 pm to provider Dr. Leonie Man via secure text paging. Electronically Signed   By: Margaretha Sheffield MD   On: 03/21/2020 13:22   CT-scan of the brain done today shows recent subacute left thalamic lacunar infarct and mild changes of small vessel disease.  No acute abnormalities. MRI examination of the brain not ordered as patient is claustrophobic wants to avoid it if possible    ROS positive for numbness chest pain, headache and all other systems negative Blood pressure 137/65, pulse 82, temperature 99.4 F (37.4 C), temperature source Oral, resp. rate 20, weight 70.3 kg, SpO2 97 %. Physical Exam Pleasant anxious middle-aged Caucasian lady not in distress. . Afebrile. Head is nontraumatic. Neck is supple without bruit.    Cardiac exam no murmur or gallop. Lungs are clear to auscultation. Distal pulses are well felt. Neurological Exam ;  Awake  Alert oriented x 3. Normal speech and language.eye movements full without nystagmus.fundi were not visualized. Vision acuity and fields appear normal. Hearing is normal. Palatal movements are normal. Face symmetric. Tongue midline. Normal strength, tone, reflexes and coordination.  Diminished right hemibody touch pinprick and vibration sensation. Gait deferred. NIH stroke scale 1 ( old deficits from recent stroke) Premorbid modified Rankin score 2 Assessment :  64 year old Caucasian lady with recent left thalamic infarct 1 week ago from small vessel disease returns with sudden onset of left face and hand paresthesias in the setting of headache and chest pain of unclear etiology.  Possibly small lacunar brainstem infarct versus anxiety. Patient is not eligible for IV TPA due to recent stroke and clinical history and exam do not suggest LVO.  She has recently had a  complete stroke work-up and further evaluation is likely to not change treatment.  Plan: Since patient symptoms seem to be resolving quickly I recommend observation until her symptoms have resolved.  Since she had a complete stroke work-up a week ago there is no point any repeating the work-up again.  She has no physical deficits that will make it unsafe for her to go home at the present time.  Continue aspirin and Plavix for 3 weeks as previously outlined in the plan followed by Plavix alone.  Continue Lipitor 80 mg daily.  Patient complimenting on her smoking cessation and advised to continue to do so.  Aggressive risk factor modification.  Follow-up in the stroke clinic in 6 weeks.  If the symptoms get worse or she develops new symptoms may consider doing MRI scan and changing to aspirin and Brilinta if necessary.  Long discussion with patient regarding her symptoms and discuss evaluation and treatment and answered questions.  She was asked to return to the emergency room if she had new or worsening symptoms.  She voiced understanding.  Discussed with Dr. Dina Rich ER physician.  Greater than 50% time during this 80-minute consultation visit was spent on counseling and coordination of care about TIA and stroke discussion about prevention and treatment questions Follow-up as an outpatient stroke clinic in 6 weeks  Deborah Peters 03/21/2020, 1:51 PM    Note: This document was prepared with digital dictation and possible smart phrase technology. Any transcriptional errors that result from this process are unintentional.

## 2020-03-25 ENCOUNTER — Inpatient Hospital Stay: Admission: RE | Admit: 2020-03-25 | Payer: PPO | Source: Ambulatory Visit

## 2020-03-29 DIAGNOSIS — M961 Postlaminectomy syndrome, not elsewhere classified: Secondary | ICD-10-CM | POA: Diagnosis not present

## 2020-03-29 DIAGNOSIS — M5410 Radiculopathy, site unspecified: Secondary | ICD-10-CM | POA: Diagnosis not present

## 2020-03-31 ENCOUNTER — Ambulatory Visit
Admission: RE | Admit: 2020-03-31 | Discharge: 2020-03-31 | Disposition: A | Discharge status: Home / Self Care | Payer: No Typology Code available for payment source | Source: Ambulatory Visit | Attending: Internal Medicine | Admitting: Internal Medicine

## 2020-03-31 DIAGNOSIS — I10 Essential (primary) hypertension: Secondary | ICD-10-CM | POA: Diagnosis not present

## 2020-03-31 DIAGNOSIS — F329 Major depressive disorder, single episode, unspecified: Secondary | ICD-10-CM | POA: Diagnosis not present

## 2020-03-31 DIAGNOSIS — I639 Cerebral infarction, unspecified: Secondary | ICD-10-CM | POA: Diagnosis not present

## 2020-03-31 DIAGNOSIS — E1169 Type 2 diabetes mellitus with other specified complication: Secondary | ICD-10-CM | POA: Diagnosis not present

## 2020-03-31 DIAGNOSIS — K219 Gastro-esophageal reflux disease without esophagitis: Secondary | ICD-10-CM | POA: Diagnosis not present

## 2020-03-31 DIAGNOSIS — M5136 Other intervertebral disc degeneration, lumbar region: Secondary | ICD-10-CM | POA: Diagnosis not present

## 2020-03-31 DIAGNOSIS — E785 Hyperlipidemia, unspecified: Secondary | ICD-10-CM

## 2020-03-31 DIAGNOSIS — Z136 Encounter for screening for cardiovascular disorders: Secondary | ICD-10-CM | POA: Diagnosis not present

## 2020-04-19 ENCOUNTER — Inpatient Hospital Stay: Payer: Self-pay | Admitting: Adult Health

## 2020-04-19 NOTE — Progress Notes (Deleted)
[facility] [address] [place]. [place] [address] [phone]       New Site NOTE  [name] Date of Birth:  1956/03/02 Medical Record Number:  [id number]   Reason for Referral:  hospital stroke follow up    SUBJECTIVE:   CHIEF COMPLAINT:  No chief complaint on file.   HPI:   [name] is a 65 y.o. female with history of diabetes, hypertension, hyperlipidemia, meralgia paresthetica, heart murmur, and migraines who presented to [facility] on 03/17/2020 with 4d hx of R face, arm and leg numbness with difficulty walking.   Personally reviewed hospitalization pertinent progress notes, lab work and imaging with summary provided.  Evaluated by Dr. 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  Recommended DAPT for 3 weeks and Plavix alone as previously on aspirin.  History of HTN stable.  History of HLD on pravastatin 20, Zetia 10 and fish oil -direct LDL 95 with LDL UTC d/t TG 467.  Recommended transitioning to atorvastatin 80 mg daily but per hospital notes, patient tried multiple statins previously with PCP Dr. Abner Greenspan and chose to continue on pravastatin at discharge.  Controlled DM with A1c 5.7.  Other stroke risk factors include prior strokes on imaging, current tobacco use, history of EtOH use and migraines.    Evaluated by therapies and discharged home in stable condition without therapy needs.  Stroke:   [facility] thalamic infarct secondary to small vessel disease    MRI  Subacute Deborah Peters thalamocapsular infarct. Remote Deborah [facility] basal ganglia lacune. Small vessel disease.   MRA  57mm Deborah Peters paraopthalmic artery aneurysm. Mild atherosclerosis   Carotid Doppler bilateral no carotid stenosis  2D Echo  EF 60 to 65%.  No cardiac source of embolism.  LDL UTC d/t TG 467, direct LDL 95.1   HgbA1c 5.7  aspirin 81 mg daily prior to admission, now on aspirin 325 mg daily. Decrease aspirin to 81 and add plavix 75.  Continue DAPT x 3 weeks then plavix alone.   Therapy recommendations:   No OT or PT follow-up   Disposition: Home       ROS:   14 system review of systems performed and negative with exception of ***  PMH:  Past Medical History:  Diagnosis Date  . Allergy   . Chronic back pain   . Constipation due to opioid therapy   . DDD (degenerative disc disease), lumbar   . Depression   . Diabetes mellitus, type 2 ([place])   . Fatty liver   . GERD (gastroesophageal reflux disease)    past hx of   . Gestational diabetes    no meds as of 06-01-2016.  diet controlled  . Heart murmur   . Heart murmur   . Hyperlipidemia   . Hypertension    pt this pain related - id occasionally high   . IFG (impaired fasting glucose)   . Meralgia paraesthetica   . Migraines   . Post-menopausal   . Scoliosis   . Ulcer    years ago. gastric ulcer.    PSH:  Past Surgical History:  Procedure Laterality Date  . ABDOMINAL HYSTERECTOMY     has her ovaries  . BACK SURGERY  2011   L5 S1  . COLONOSCOPY    . JOINT REPLACEMENT     Deborah Peters knee  . knee surgeries     x 8 before replacement   . POLYPECTOMY     HPP  in 2008 DB    Social History:  Social History   Socioeconomic History  . Marital status: Married    Spouse name: Not on file  . Number of children: 2  . Years of education: Not on file  . Highest education level: Not on file  Occupational History  . Occupation: disabled  Tobacco Use  . Smoking status: Former Games developermoker  . Smokeless tobacco: Never Used  Vaping Use  . Vaping Use: Never used  Substance and Sexual Activity  . Alcohol use: Not Currently    Comment: Rare  . Drug use: No  . Sexual activity: Not on file  Other Topics Concern  . Not on file  Social History Narrative  . Not on file   Social Determinants of Health   Financial Resource Strain: Not on file  Food Insecurity: Not on file  Transportation Needs: Not on file  Physical Activity: Not on file  Stress: Not on file   Social Connections: Not on file  Intimate Partner Violence: Not on file    Family History:  Family History  Problem Relation Age of Onset  . Cancer Maternal Grandmother   . Cancer Maternal Grandfather   . Colon polyps Sister   . Colon polyps Brother   . Stomach cancer Paternal Grandfather   . Esophageal cancer Neg Hx   . Rectal cancer Neg Hx   . Colon cancer Neg Hx   . Pancreatic cancer Neg Hx     Medications:   Current Outpatient Medications on File Prior to Visit  Medication Sig Dispense Refill  . albuterol (VENTOLIN HFA) 108 (90 Base) MCG/ACT inhaler Inhale 2 puffs into the lungs every 6 (six) hours as needed for wheezing or shortness of breath.    Marland Kitchen. ascorbic acid (VITAMIN C) 500 MG tablet Take 500 mg by mouth daily.    Marland Kitchen. aspirin EC 81 MG tablet Take 81 mg by mouth daily.    Marland Kitchen. atorvastatin (LIPITOR) 80 MG tablet Take 1 tablet (80 mg total) by mouth at bedtime. 30 tablet 1  . benzonatate (TESSALON) 100 MG capsule Take 100 mg by mouth 3 (three) times daily as needed for cough.    . diazepam (VALIUM) 5 MG tablet Take 5 mg by mouth every 8 (eight) hours as needed for anxiety or muscle spasms.    . DULoxetine (CYMBALTA) 60 MG capsule Take 60 mg by mouth daily.    Marland Kitchen. ezetimibe (ZETIA) 10 MG tablet Take 10 mg by mouth daily.  3  . fish oil-omega-3 fatty acids 1000 MG capsule Take 2 g by mouth 2 (two) times daily.     Marland Kitchen. gabapentin (NEURONTIN) 400 MG capsule Take 400 mg by mouth 3 (three) times daily.    . magnesium 30 MG tablet Take 30 mg by mouth 2 (two) times daily.    . milk thistle 175 MG tablet Take 175 mg by mouth daily.    Marland Kitchen. OVER THE COUNTER MEDICATION Take 1 capsule by mouth daily. Tumeric    . OVER THE COUNTER MEDICATION Take 1 tablet by mouth daily. Cinnamon daily    . OVER THE COUNTER MEDICATION Take 1 tablet by mouth at bedtime. alteril natural sleep aide at bedtime    . Oxycodone HCl 10 MG TABS Take 10 mg by mouth 5 (five) times daily.  0  . pantoprazole (PROTONIX) 40 MG  tablet Take 1 tablet (40 mg total) by mouth 2 (two) times daily. (Patient taking differently: Take 40 mg by mouth daily as  needed (indigestion).) 180 tablet 0  . promethazine (PHENERGAN) 25 MG tablet Take 25 mg by mouth every 6 (six) hours as needed for nausea or vomiting.    Marland Kitchen VITAMIN E PO Take 1 capsule by mouth daily.    Marland Kitchen zolpidem (AMBIEN CR) 6.25 MG CR tablet Take 6.25 mg by mouth at bedtime.   5   No current facility-administered medications on file prior to visit.    Allergies:   Allergies  Allergen Reactions  . Sulfa Antibiotics Anaphylaxis      OBJECTIVE:  Physical Exam  There were no vitals filed for this visit. There is no height or weight on file to calculate BMI. No exam data present  No flowsheet data found.   General: well developed, well nourished, seated, in no evident distress Head: head normocephalic and atraumatic.   Neck: supple with no carotid or supraclavicular bruits Cardiovascular: regular rate and rhythm, no murmurs Musculoskeletal: no deformity Skin:  no rash/petichiae Vascular:  Normal pulses all extremities   Neurologic Exam Mental Status: Awake and fully alert. Oriented to place and time. Recent and remote memory intact. Attention span, concentration and fund of knowledge appropriate. Mood and affect appropriate.  Cranial Nerves: Fundoscopic exam reveals sharp disc margins. Pupils equal, briskly reactive to light. Extraocular movements full without nystagmus. Visual fields full to confrontation. Hearing intact. Facial sensation intact. Face, tongue, palate moves normally and symmetrically.  Motor: Normal bulk and tone. Normal strength in all tested extremity muscles Sensory.: intact to touch , pinprick , position and vibratory sensation.  Coordination: Rapid alternating movements normal in all extremities. Finger-to-nose and heel-to-shin performed accurately bilaterally. Gait and Station: Arises from chair without difficulty. Stance is normal.  Gait demonstrates normal stride length and balance Reflexes: 1+ and symmetric. Toes downgoing.     NIHSS  *** Modified Rankin  ***      ASSESSMENT: Deborah Peters is a 65 y.o. 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  Vascular risk factors include HTN, HLD, DM, prior stroke on imaging, current tobacco use, history of EtOH use and migraines.      PLAN:  1. Left thalamic stroke: Residual deficit: ***. Continue clopidogrel 75 mg daily  and atorvastatin 80 mg daily, Zetia 10 mg daily and fish oil for secondary stroke prevention.  Discussed secondary stroke prevention measures and importance of close PCP follow up for aggressive stroke risk factor management  2. HTN: BP goal <130/90.  Stable on *** per PCP 3. HLD: LDL goal <70. Recent direct LDL 95.1 therefore transitioned from pravastatin to atorvastatin 80 mg daily during recent stroke admission.   4. DMII: A1c goal<7.0.  Well-controlled with recent A1c 5.7.     Follow up in *** or call earlier if needed   I spent *** minutes of face-to-face and non-face-to-face time with patient.  This included previsit chart review, lab review, study review, order entry, electronic health record documentation, patient education regarding recent stroke, residual deficits, importance of managing stroke risk factors and answered all questions to patient satisfaction     Frann Rider, [facility]  [facility] [address] [place], [place] [address]  Phone [phone] Fax [phone] Note: This document was prepared with digital dictation and possible smart phrase technology. Any transcriptional errors that result from this process are unintentional.

## 2020-05-17 ENCOUNTER — Other Ambulatory Visit: Payer: Self-pay

## 2020-05-17 ENCOUNTER — Ambulatory Visit: Payer: PPO | Admitting: Adult Health

## 2020-05-17 ENCOUNTER — Encounter: Payer: Self-pay | Admitting: Adult Health

## 2020-05-17 VITALS — BP 116/67 | HR 90 | Ht 66.0 in | Wt 153.0 lb

## 2020-05-17 DIAGNOSIS — I6381 Other cerebral infarction due to occlusion or stenosis of small artery: Secondary | ICD-10-CM

## 2020-05-17 DIAGNOSIS — I639 Cerebral infarction, unspecified: Secondary | ICD-10-CM

## 2020-05-17 NOTE — Progress Notes (Signed)
Deborah Peters       Deborah Peters NOTE  [name] Date of Birth:  1955/06/24 Medical Record Number:  [id number]   Reason for Referral:  hospital stroke follow up    SUBJECTIVE:   CHIEF COMPLAINT:  Chief Complaint  Patient presents with  . Follow-up    TR husband (bob) Pt is well, having back pain and tingling/pain in R arm     HPI:   [name] is a 65 y.o. female with history of diabetes, hypertension, hyperlipidemia, meralgia paresthetica, heart murmur, and migraines who presented to [facility] on 03/17/2020 with 4d hx of R face, arm and leg numbness with difficulty walking.   Personally reviewed hospitalization pertinent progress notes, lab work and imaging with summary provided.  Evaluated by Dr. 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  Recommended DAPT for 3 weeks and Plavix alone as previously on aspirin.  History of HTN stable.  History of HLD on pravastatin 20, Zetia 10 and fish oil -direct LDL 95 with LDL UTC d/t TG 467.  Recommended transitioning to atorvastatin 80 mg daily but per hospital notes, patient tried multiple statins previously with PCP Dr. Abner Peters and chose to continue on pravastatin at discharge.  Controlled DM with A1c 5.7.  Other stroke risk factors include prior strokes on imaging, current tobacco use, history of EtOH use and migraines.    Evaluated by therapies and discharged home in stable condition without therapy needs.  Stroke:   L thalamic infarct secondary to small vessel disease    MRI  Subacute L thalamocapsular infarct. Remote L basal ganglia lacune. Small vessel disease.   MRA  65mm L paraopthalmic artery aneurysm. Mild atherosclerosis   Carotid Doppler bilateral no carotid stenosis  2D Echo  EF 60 to 65%.  No cardiac source of embolism.  LDL UTC d/t TG 467, direct LDL 95.1   HgbA1c 5.7  aspirin 81  mg daily prior to admission, now on aspirin 325 mg daily. Decrease aspirin to 81 and add plavix 75. Continue DAPT x 3 weeks then plavix alone.   Therapy recommendations:   No OT or PT follow-up   Disposition: Home  Today, 05/17/2020, Deborah Peters is being seen for hospital follow-up accompanied by her husband.   Reports residual right arm and hand numbness/tingling which has been gradually improving.  She also reports mild cognitive difficulties which have been present prior to her stroke and slightly worsened with recent stroke She did have episode of left sided numbness which quickly resolved - evaluated in ED by neurology who felt symptom pattern not typical for acute stroke - no additional symptoms since that time Denies new stroke/TIA symptoms  Completed 3 months DAPT and remains on Plavix alone without bleeding or bruising Remains on atorvastatin 80 mg daily without myalgias Blood pressure today 116/67 Continued tobacco use with plans on quitting in the near future  She continues to follow neurosurgery for chronic lower back pain     ROS:   14 system review of systems performed and negative with exception of those listed in HPI  PMH:  Past Medical History:  Diagnosis Date  . Allergy   . Chronic back pain   . Constipation due to opioid therapy   . DDD (degenerative disc disease), lumbar   . Depression   . Diabetes mellitus, type 2 ([place])   . Fatty liver   .  GERD (gastroesophageal reflux disease)    past hx of   . Gestational diabetes    no meds as of 06-01-2016.  diet controlled  . Heart murmur   . Heart murmur   . Hyperlipidemia   . Hypertension    pt this pain related - id occasionally high   . IFG (impaired fasting glucose)   . Meralgia paraesthetica   . Migraines   . Post-menopausal   . Scoliosis   . Ulcer    years ago. gastric ulcer.    PSH:  Past Surgical History:  Procedure Laterality Date  . ABDOMINAL HYSTERECTOMY     has her ovaries  . BACK SURGERY   2011   L5 S1  . COLONOSCOPY    . JOINT REPLACEMENT     L knee  . knee surgeries     x 8 before replacement   . POLYPECTOMY     HPP in 2008 DB    Social History:  Social History   Socioeconomic History  . Marital status: Married    Spouse name: Not on file  . Number of children: 2  . Years of education: Not on file  . Highest education level: Not on file  Occupational History  . Occupation: disabled  Tobacco Use  . Smoking status: Former Research scientist (life sciences)  . Smokeless tobacco: Never Used  Vaping Use  . Vaping Use: Never used  Substance and Sexual Activity  . Alcohol use: Not Currently    Comment: Rare  . Drug use: No  . Sexual activity: Not on file  Other Topics Concern  . Not on file  Social History Narrative  . Not on file   Social Determinants of Health   Financial Resource Strain: Not on file  Food Insecurity: Not on file  Transportation Needs: Not on file  Physical Activity: Not on file  Stress: Not on file  Social Connections: Not on file  Intimate Partner Violence: Not on file    Family History:  Family History  Problem Relation Age of Onset  . Cancer Maternal Grandmother   . Cancer Maternal Grandfather   . Colon polyps Sister   . Colon polyps Brother   . Stomach cancer Paternal Grandfather   . Esophageal cancer Neg Hx   . Rectal cancer Neg Hx   . Colon cancer Neg Hx   . Pancreatic cancer Neg Hx     Medications:   Current Outpatient Medications on File Prior to Visit  Medication Sig Dispense Refill  . ascorbic acid (VITAMIN C) 500 MG tablet Take 500 mg by mouth daily.    Marland Kitchen atorvastatin (LIPITOR) 80 MG tablet Take 1 tablet (80 mg total) by mouth at bedtime. 30 tablet 1  . benzonatate (TESSALON) 100 MG capsule Take 100 mg by mouth 3 (three) times daily as needed for cough.    . diazepam (VALIUM) 5 MG tablet Take 5 mg by mouth every 8 (eight) hours as needed for anxiety or muscle spasms.    . DULoxetine (CYMBALTA) 60 MG capsule Take 60 mg by mouth daily.     Marland Kitchen ezetimibe (ZETIA) 10 MG tablet Take 10 mg by mouth daily.  3  . fish oil-omega-3 fatty acids 1000 MG capsule Take 2 g by mouth 2 (two) times daily.     Marland Kitchen gabapentin (NEURONTIN) 400 MG capsule Take 400 mg by mouth 3 (three) times daily.    . magnesium 30 MG tablet Take 30 mg by mouth 2 (two) times daily.    Marland Kitchen  OVER THE COUNTER MEDICATION Take 1 capsule by mouth daily. Tumeric    . OVER THE COUNTER MEDICATION Take 1 tablet by mouth daily. Cinnamon daily    . OVER THE COUNTER MEDICATION Take 1 tablet by mouth at bedtime. alteril natural sleep aide at bedtime    . Oxycodone HCl 10 MG TABS Take 10 mg by mouth 5 (five) times daily.  0  . promethazine (PHENERGAN) 25 MG tablet Take 25 mg by mouth every 6 (six) hours as needed for nausea or vomiting.    Marland Kitchen zolpidem (AMBIEN CR) 6.25 MG CR tablet Take 6.25 mg by mouth at bedtime.   5  . albuterol (VENTOLIN HFA) 108 (90 Base) MCG/ACT inhaler Inhale 2 puffs into the lungs every 6 (six) hours as needed for wheezing or shortness of breath. (Patient not taking: Reported on 05/17/2020)     No current facility-administered medications on file prior to visit.    Allergies:   Allergies  Allergen Reactions  . Sulfa Antibiotics Anaphylaxis      OBJECTIVE:  Physical Exam  Vitals:   05/17/20 1503  BP: 116/67  Pulse: 90  Weight: 153 lb (69.4 kg)  Height: 5\' 6"  (1.676 m)   Body mass index is 24.69 kg/m.  General: well developed, well nourished,  pleasant middle-age Caucasian female, seated, in no evident distress Head: head normocephalic and atraumatic.   Neck: supple with no carotid or supraclavicular bruits Cardiovascular: regular rate and rhythm, no murmurs Musculoskeletal: no deformity Skin:  no rash/petichiae Vascular:  Normal pulses all extremities   Neurologic Exam Mental Status: Awake and fully alert.   Fluent speech and language.  Oriented to place and time. Recent memory subjectively impaired and remote memory intact. Attention span,  concentration and fund of knowledge appropriate. Mood and affect appropriate.  Cranial Nerves: Fundoscopic exam reveals sharp disc margins. Pupils equal, briskly reactive to light. Extraocular movements full without nystagmus. Visual fields full to confrontation. Hearing intact. Facial sensation intact. Face, tongue, palate moves normally and symmetrically.  Motor: Normal bulk and tone. Normal strength in all tested extremity muscles Sensory.: intact to touch , pinprick , position and vibratory sensation.  Subjective "odd sensation" with light touch RUE distally Coordination: Rapid alternating movements normal in all extremities. Finger-to-nose and heel-to-shin performed accurately bilaterally. Gait and Station: Arises from chair without difficulty. Stance is normal. Gait demonstrates normal stride length and balance without use of assistive device Reflexes: 1+ and symmetric. Toes downgoing.     NIHSS  1 Modified Rankin  2      ASSESSMENT: Deborah Peters is a 65 y.o. 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  Vascular risk factors include HTN, HLD, DM, prior stroke on imaging, current tobacco use, history of EtOH use and migraines.      PLAN:  1. Left thalamic stroke:  a. Residual deficit: RUE sensory impairment and mild cognitive impairment.  Provided exercises to continue at home and discussed typical recovery time.  Also discussed prior stroke, chronic pain, underlying depression/anxiety and stressors can contribute to short-term memory loss b. Continue clopidogrel 75 mg daily  and atorvastatin 80 mg daily, Zetia 10 mg daily and fish oil for secondary stroke prevention.   c. Discussed secondary stroke prevention measures and importance of close PCP follow up for aggressive stroke risk factor management  2. HTN: BP goal <130/90.  Stable monitor by  PCP 3. HLD: LDL goal <70. Recent direct LDL 95.1 therefore transitioned from pravastatin to atorvastatin 80 mg daily during recent stroke admission.  Advised to continue atorvastatin with routine follow-up and prescribing by PCP 4. DMII: A1c goal<7.0.  Well-controlled with recent A1c 5.7.  5. Tobacco use: Discussed importance of complete tobacco cessation.  Patient wishes to quit and advised to further discuss with PCP if additional assistance needed    Follow up in 4 months or call earlier if needed  CC:  [facility] provider: Dr. Mont Dutton, MD     I spent 45 minutes of face-to-face and non-face-to-face time with patient and husband.  This included previsit chart review including recent hospitalization pertinent progress notes, lab work and imaging, lab review, study review, order entry, electronic health record documentation, patient education regarding recent stroke including etiology, residual deficits, importance of managing stroke risk factors and answered all other questions to patient and husband's satisfaction   Frann Rider, AGNP-BC  [facility] [address] [place], [place] [address]  Phone [phone] Fax [phone] Note: This document was prepared with digital dictation and possible smart phrase technology. Any transcriptional errors that result from this process are unintentional.

## 2020-05-17 NOTE — Patient Instructions (Signed)
Continue clopidogrel 75 mg daily  and atorvastatin 80 mg daily for secondary stroke prevention  Continue to follow up with PCP regarding cholesterol and blood pressure management  Maintain strict control of hypertension with blood pressure goal below 130/90 and cholesterol with LDL cholesterol (bad cholesterol) goal below 70 mg/dL.       Followup in the future with me in 4 months or call earlier if needed       Thank you for coming to see [place] at [facility]. I hope we have been able to provide you high quality care today.  You may receive a patient satisfaction survey over the next few weeks. We would appreciate your feedback and comments so that we may continue to improve ourselves and the health of our patients.     Sensory Loss After a Stroke A stroke can damage parts of your brain that control your body's normal functions, including your senses. As a result, you may have sensory loss, such as trouble seeing, tasting, swallowing, or feeling touch or pressure sensations. You may have problems feeling temperature changes or moving your body in a coordinated way. You may also perceive smell differently. You may have problems with all of your senses or only some of them. By following a treatment plan, you may recover lost senses and manage the changes to your lifestyle. What are treatment therapies for sensory loss? You may have a combination of therapies for sensory loss.  Physical therapy. This may include: ? Exercises to improve your coordination and balance. ? Exercises that combine touch, balance, and movement (sensorimotor training). ? Movements to relieve pressure while you are sitting or lying (mobility training). ? Splints or braces to protect parts of your body that you cannot feel.  Devices to help with blood flow (circulation) and to help stimulate nerves in affected parts of your body such as compression devices or gloves.  Speech therapy to help you  swallow safely.  Occupational therapy to help you with everyday tasks. This may include: ? Exercises or devices to improve your vision. ? Exercises to help you increase your touch perception. These may include feeling objects of different sizes and textures while your eyes are closed. ? Techniques for moving safely in your environment.  Prescription eye glasses for vision loss.  Hearing aids for hearing loss. Follow these instructions at home: Safety  Your risk of falling is higher after a stroke. You may have difficulty feeling your legs and feet or coordinating your movements. To lower your risk of falling: ? Use devices to help you move around (assistive devices), such as a wheelchair or walker, as directed by your health care team. ? Wear prescription eye glasses at all times when moving around. ? Use lights to help you see in the dark. ? Use grab bars in bathrooms and handrails in stairways. ? Keep walkways clear in your home by removing rugs, cords, and clutter from the floor.  Your risk of getting burned is higher after a stroke. To lower your risk of burns: ? Test the water temperature before taking a bath or washing your hands. ? Allow hot foods to cool slightly before eating. ? Use potholders when handling hot pans.  When using sharp objects, such as scissors or knives, use your healthy hand. Do not handle sharp objects with your hand that was affected by your stroke, if this applies.   Activity  Return to your normal activities as told by your health care provider. Ask your  health care provider what activities are safe for you.  Avoid spending too much time sitting or lying down. If you must be in a chair or bed, change positions regularly.  You may have problems doing your normal activities. Ask your health care provider about getting extra help at home. Eating and drinking  You may have problems swallowing food and fluids after a stroke. The problems can be due  to: ? Changes in your muscles. ? Sensory changes, such as:  Difficulty feeling the consistency or size of a piece of food in your mouth.  Inability to feel the need to clear your throat.  You may need to: ? Take smaller bites and chew thoroughly. Make sure you have swallowed all the food in your mouth before you take another bite. ? Sit in an upright position when eating or drinking. ? Avoid distractions while eating or drinking. ? Stay upright for 30-45 minutes after eating. ? Change the texture of some things that you eat and drink. This may include:  Changing foods to a smooth, mashed consistency (puree).  Thickening liquids.  Follow instructions from your health care provider about eating and drinking restrictions.   General instructions  Wear arm or leg braces as told by your health care team.  Get help at home as needed. You may need help getting dressed, bathing, using the bathroom, eating, or doing other activities.  Keep all follow-up visits as told by your health care providers. This is important. Summary  It is common to have sensory loss after a stroke. Sensory loss means that you have problems with some or all of your senses, such as vision, taste, hearing, smell, and touch.  Sensory loss happens because of damage to your brain and nervous system after a stroke.  Treatment for sensory loss may include physical, occupational, or speech therapy, and the use of assistive devices.  You may need to make changes to your home and lifestyle after a stroke to help you live safely and independently. This information is not intended to replace advice given to you by your health care provider. Make sure you discuss any questions you have with your health care provider. Document Revised: 07/05/2018 Document Reviewed: 06/30/2016 Elsevier Patient Education  2021 [employer].    Mild Neurocognitive Disorder Mild neurocognitive disorder, formerly known as mild cognitive  impairment, is a disorder in which memory does not work as well as it should. This disorder may also cause problems with other mental functions, including thought, communication, behavior, and completion of tasks. These problems can be noticed and measured, but they usually do not interfere with daily activities or the ability to live independently. Mild neurocognitive disorder typically develops after 65 years of age, but it can also develop at younger ages. It is not as serious as major neurocognitive disorder, also known as dementia, but it may be the first sign of it. Generally, symptoms of this condition get worse over time. In rare cases, symptoms can get better. What are the causes? This condition may be caused by:  Brain disorders like Alzheimer's disease, Parkinson's disease, and other conditions that gradually damage nerve cells (neurodegenerative conditions).  Diseases that affect blood vessels in the brain and result in small strokes.  Certain infections, such as HIV.  Traumatic brain injury.  Other medical conditions, such as brain tumors, underactive thyroid (hypothyroidism), and vitamin B12 deficiency.  Use of certain drugs or prescription medicines. What increases the risk? The following factors may make you more likely  to develop this condition:  Being older than 53 years.  Being female.  Low education level.  Diabetes, high blood pressure, high cholesterol, and other conditions that increase the risk for blood vessel diseases.  Untreated or undertreated sleep apnea.  Having a certain type of gene that can be passed from parent to child (inherited).  Chronic health problems such as heart disease, lung disease, liver disease, kidney disease, or depression. What are the signs or symptoms? Symptoms of this condition include:  Difficulty remembering. You may: ? Forget names, phone numbers, or details of recent events. ? Forget social events and  appointments. ? Repeatedly forget where you put your car keys or other items.  Difficulty thinking and solving problems. You may have trouble with complex tasks, such as: ? Paying bills. ? Driving in unfamiliar places.  Difficulty communicating. You may have trouble: ? Finding the right word or naming an object. ? Forming a sentence that makes sense, or understanding what you read or hear.  Changes in your behavior or personality. When this happens, you may: ? Lose interest in the things that you used to enjoy. ? Withdraw from social situations. ? Get angry more easily than usual. ? Act before thinking. How is this diagnosed? This condition is diagnosed based on:  Your symptoms. Your health care provider may ask you and the people you spend time with, such as family and friends, about your symptoms.  Evaluation of mental functions (neuropsychological testing). Your health care provider may refer you to a neurologist or mental health specialist to evaluate your mental functions in detail. To identify the cause of your condition, your health care provider may:  Get a detailed medical history.  Ask about use of alcohol, drugs, and prescription medicines.  Do a physical exam.  Order blood tests and brain imaging exams. How is this treated? Mild neurocognitive disorder that is caused by medicine use, drug use, infection, or another medical condition may improve when the cause is treated, or when medicines or drugs are stopped. If this disorder has another cause, it generally does not improve and may get worse. In these cases, the goal of treatment is to help you manage the loss of mental function. Treatments in these cases include:  Medicine. Medicine mainly helps memory and behavior symptoms.  Talk therapy. Talk therapy provides education, emotional support, memory aids, and other ways of making up for problems with mental function.  Lifestyle changes, including: ? Getting regular  exercise. ? Eating a healthy diet that includes omega-3 fatty acids. ? Challenging your thinking and memory skills. ? Having more social interaction. Follow these instructions at home: Eating and drinking  Drink enough fluid to keep your urine pale yellow.  Eat a healthy diet that includes omega-3 fatty acids. These can be found in: ? Fish. ? Nuts. ? Leafy vegetables. ? Vegetable oils.  If you drink alcohol: ? Limit how much you use to:  0-1 drink a day for women.  0-2 drinks a day for men. ? Be aware of how much alcohol is in your drink. In the [place] one drink equals one 12 oz bottle of beer (355 mL), one 5 oz glass of wine (148 mL), or one 1 oz glass of hard liquor (44 mL).   Lifestyle  Get regular exercise as told by your health care provider.  Do not use any products that contain nicotine or tobacco, such as cigarettes, e-cigarettes, and chewing tobacco. If you need help quitting, ask your health care  provider.  Practice ways to manage stress. If you need help managing stress, ask your health care provider.  Continue to have social interaction.  Keep your mind active with stimulating activities you enjoy, such as reading or playing games.  Make sure to get quality sleep. Follow these tips: ? Avoid napping during the day. ? Keep your sleeping area dark and cool. ? Avoid exercising during the few hours before you go to bed. ? Avoid caffeine products in the evening.   General instructions  Take over-the-counter and prescription medicines only as told by your health care provider. Your health care provider may recommend that you avoid taking medicines that can affect thinking, such as pain medicines or sleep medicines.  Work with your health care provider to find out what you need help with and what your safety needs are.  Keep all follow-up visits. This is important. Where to find more information  [employer] on Aging: http://kim-miller.com/ Contact a health care  provider if:  You have any new symptoms. Get help right away if:  You develop new confusion or your confusion gets worse.  You act in ways that place you or your family in danger. Summary  Mild neurocognitive disorder is a disorder in which memory does not work as well as it should.  Mild neurocognitive disorder can have many causes. It may be the first stage of dementia.  To manage your condition, get regular exercise, keep your mind active, get quality sleep, and eat a healthy diet. This information is not intended to replace advice given to you by your health care provider. Make sure you discuss any questions you have with your health care provider. Document Revised: 07/28/2019 Document Reviewed: 07/28/2019 Elsevier Patient Education  [employer].

## 2020-05-18 DIAGNOSIS — M47816 Spondylosis without myelopathy or radiculopathy, lumbar region: Secondary | ICD-10-CM | POA: Diagnosis not present

## 2020-05-18 DIAGNOSIS — M961 Postlaminectomy syndrome, not elsewhere classified: Secondary | ICD-10-CM | POA: Diagnosis not present

## 2020-05-18 DIAGNOSIS — F1120 Opioid dependence, uncomplicated: Secondary | ICD-10-CM | POA: Diagnosis not present

## 2020-05-18 NOTE — Progress Notes (Signed)
I agree with the above plan 

## 2020-06-30 ENCOUNTER — Other Ambulatory Visit: Payer: PPO

## 2020-07-05 DIAGNOSIS — E785 Hyperlipidemia, unspecified: Secondary | ICD-10-CM | POA: Diagnosis not present

## 2020-07-05 DIAGNOSIS — M5136 Other intervertebral disc degeneration, lumbar region: Secondary | ICD-10-CM | POA: Diagnosis not present

## 2020-07-05 DIAGNOSIS — R911 Solitary pulmonary nodule: Secondary | ICD-10-CM | POA: Diagnosis not present

## 2020-07-05 DIAGNOSIS — J018 Other acute sinusitis: Secondary | ICD-10-CM | POA: Diagnosis not present

## 2020-07-05 DIAGNOSIS — I10 Essential (primary) hypertension: Secondary | ICD-10-CM | POA: Diagnosis not present

## 2020-07-05 DIAGNOSIS — I700 Atherosclerosis of aorta: Secondary | ICD-10-CM | POA: Diagnosis not present

## 2020-07-05 DIAGNOSIS — I639 Cerebral infarction, unspecified: Secondary | ICD-10-CM | POA: Diagnosis not present

## 2020-07-05 DIAGNOSIS — F329 Major depressive disorder, single episode, unspecified: Secondary | ICD-10-CM | POA: Diagnosis not present

## 2020-07-05 DIAGNOSIS — I2510 Atherosclerotic heart disease of native coronary artery without angina pectoris: Secondary | ICD-10-CM | POA: Diagnosis not present

## 2020-07-05 DIAGNOSIS — E1169 Type 2 diabetes mellitus with other specified complication: Secondary | ICD-10-CM | POA: Diagnosis not present

## 2020-07-14 ENCOUNTER — Ambulatory Visit
Admission: RE | Admit: 2020-07-14 | Discharge: 2020-07-14 | Disposition: A | Discharge status: Home / Self Care | Payer: PPO | Source: Ambulatory Visit | Attending: Neurosurgery | Admitting: Neurosurgery

## 2020-07-14 DIAGNOSIS — E785 Hyperlipidemia, unspecified: Secondary | ICD-10-CM | POA: Diagnosis not present

## 2020-07-14 DIAGNOSIS — E1169 Type 2 diabetes mellitus with other specified complication: Secondary | ICD-10-CM | POA: Diagnosis not present

## 2020-07-14 DIAGNOSIS — I2510 Atherosclerotic heart disease of native coronary artery without angina pectoris: Secondary | ICD-10-CM | POA: Diagnosis not present

## 2020-07-14 DIAGNOSIS — M961 Postlaminectomy syndrome, not elsewhere classified: Secondary | ICD-10-CM

## 2020-07-14 DIAGNOSIS — M545 Low back pain, unspecified: Secondary | ICD-10-CM | POA: Diagnosis not present

## 2020-07-16 DIAGNOSIS — F1120 Opioid dependence, uncomplicated: Secondary | ICD-10-CM | POA: Diagnosis not present

## 2020-07-16 DIAGNOSIS — M5416 Radiculopathy, lumbar region: Secondary | ICD-10-CM | POA: Diagnosis not present

## 2020-07-16 DIAGNOSIS — M961 Postlaminectomy syndrome, not elsewhere classified: Secondary | ICD-10-CM | POA: Diagnosis not present

## 2020-09-16 ENCOUNTER — Ambulatory Visit: Payer: PPO | Admitting: Adult Health

## 2020-09-16 ENCOUNTER — Telehealth: Payer: Self-pay | Admitting: Adult Health

## 2020-09-16 NOTE — Progress Notes (Deleted)
[facility] [address] [place]. [place] [address] [phone]       STROKE FOLLOW UP NOTE  Deborah. Deborah Peters Date of Birth:  09-09-1955 Medical Record Number:  [id number]    Primary neurologist: Dr. Leonie Man Reason for Referral:  stroke follow up    SUBJECTIVE:   CHIEF COMPLAINT:  No chief complaint on file.   HPI:   Today, 09/16/2020, Deborah Peters returns for 103-month stroke follow-up.  She has been stable from stroke standpoint without new stroke/TIA symptoms.  Reports residual ***.  Neurosurgery increase gabapentin dose from 1200 mg daily to 1600 mg daily for continued nerve pain interfering with sleep.  Reports compliance on Plavix and atorvastatin without associated side effects.  Blood pressure today ***.  Tobacco use ***.        History provided for reference purposes only Initial visit 05/17/2020 JM: Deborah Peters is being seen for hospital follow-up accompanied by her husband.   Reports residual right arm and hand numbness/tingling which has been gradually improving.  She also reports mild cognitive difficulties which have been present prior to her stroke and slightly worsened with recent stroke She did have episode of left sided numbness which quickly resolved - evaluated in ED by neurology who felt symptom pattern not typical for acute stroke - no additional symptoms since that time Denies new stroke/TIA symptoms  Completed 3 months DAPT and remains on Plavix alone without bleeding or bruising Remains on atorvastatin 80 mg daily without myalgias Blood pressure today 116/67 Continued tobacco use with plans on quitting in the near future  She continues to follow neurosurgery for chronic lower back pain  Stroke admission 03/17/2020 Deborah Peters is a 65 y.o. female with history of diabetes, hypertension, hyperlipidemia, meralgia paresthetica, heart murmur, and migraines who presented to [facility] ED on 03/17/2020 with 4d hx of R face, arm and leg  numbness with difficulty walking.   Personally reviewed hospitalization pertinent progress notes, lab work and imaging with summary provided.  Evaluated by Dr. 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  Recommended DAPT for 3 weeks and Plavix alone as previously on aspirin.  History of HTN stable.  History of HLD on pravastatin 20, Zetia 10 and fish oil -direct LDL 95 with LDL UTC d/t TG 467.  Recommended transitioning to atorvastatin 80 mg daily but per hospital notes, patient tried multiple statins previously with PCP Dr. Abner Greenspan and chose to continue on pravastatin at discharge.  Controlled DM with A1c 5.7.  Other stroke risk factors include prior strokes on imaging, current tobacco use, history of EtOH use and migraines.    Evaluated by therapies and discharged home in stable condition without therapy needs.  Stroke:   L thalamic infarct secondary to small vessel disease   MRI  Subacute L thalamocapsular infarct. Remote L basal ganglia lacune. Small vessel disease.  MRA  87mm L paraopthalmic artery aneurysm. Mild atherosclerosis  Carotid Doppler bilateral no carotid stenosis 2D Echo  EF 60 to 65%.  No cardiac source of embolism. LDL UTC d/t TG 467, direct LDL 95.1  HgbA1c 5.7 aspirin 81 mg daily prior to admission, now on aspirin 325 mg daily. Decrease aspirin to 81 and add plavix 75. Continue DAPT x 3 weeks then plavix alone.  Therapy recommendations:   No OT or PT follow-up  Disposition: Home      ROS:   14 system review of systems performed and negative with exception of those  listed in HPI  PMH:  Past Medical History:  Diagnosis Date   Allergy    Chronic back pain    Constipation due to opioid therapy    DDD (degenerative disc disease), lumbar    Depression    Diabetes mellitus, type 2 ([place])    Fatty liver    GERD (gastroesophageal reflux disease)    past hx of    Gestational diabetes    no meds as of 06-01-2016.  diet  controlled   Heart murmur    Heart murmur    Hyperlipidemia    Hypertension    pt this pain related - id occasionally high    IFG (impaired fasting glucose)    Meralgia paraesthetica    Migraines    Post-menopausal    Scoliosis    Ulcer    years ago. gastric ulcer.    PSH:  Past Surgical History:  Procedure Laterality Date   ABDOMINAL HYSTERECTOMY     has her ovaries   BACK SURGERY  2011   L5 S1   COLONOSCOPY     JOINT REPLACEMENT     L knee   knee surgeries     x 8 before replacement    POLYPECTOMY     HPP in 2008 DB    Social History:  Social History   Socioeconomic History   Marital status: Married    Spouse name: Not on file   Number of children: 2   Years of education: Not on file   Highest education level: Not on file  Occupational History   Occupation: disabled  Tobacco Use   Smoking status: Former    Pack years: 0.00   Smokeless tobacco: Never  Vaping Use   Vaping Use: Never used  Substance and Sexual Activity   Alcohol use: Not Currently    Comment: Rare   Drug use: No   Sexual activity: Not on file  Other Topics Concern   Not on file  Social History Narrative   Not on file   Social Determinants of Health   Financial Resource Strain: Not on file  Food Insecurity: Not on file  Transportation Needs: Not on file  Physical Activity: Not on file  Stress: Not on file  Social Connections: Not on file  Intimate Partner Violence: Not on file    Family History:  Family History  Problem Relation Age of Onset   Cancer Maternal Grandmother    Cancer Maternal Grandfather    Colon polyps Sister    Colon polyps Brother    Stomach cancer Paternal Grandfather    Esophageal cancer Neg Hx    Rectal cancer Neg Hx    Colon cancer Neg Hx    Pancreatic cancer Neg Hx     Medications:   Current Outpatient Medications on File Prior to Visit  Medication Sig Dispense Refill   ascorbic acid (VITAMIN C) 500 MG tablet Take 500 mg by mouth daily.      atorvastatin (LIPITOR) 80 MG tablet Take 1 tablet (80 mg total) by mouth at bedtime. 30 tablet 1   benzonatate (TESSALON) 100 MG capsule Take 100 mg by mouth 3 (three) times daily as needed for cough.     clopidogrel (PLAVIX) 75 MG tablet Take 75 mg by mouth daily.     diazepam (VALIUM) 5 MG tablet Take 5 mg by mouth every 8 (eight) hours as needed for anxiety or muscle spasms.     DULoxetine (CYMBALTA) 60 MG capsule Take 60 mg by  mouth daily.     ezetimibe (ZETIA) 10 MG tablet Take 10 mg by mouth daily.  3   fish oil-omega-3 fatty acids 1000 MG capsule Take 2 g by mouth 2 (two) times daily.      gabapentin (NEURONTIN) 400 MG capsule Take 400 mg by mouth 3 (three) times daily.     magnesium 30 MG tablet Take 30 mg by mouth 2 (two) times daily.     OVER THE COUNTER MEDICATION Take 1 capsule by mouth daily. Tumeric     OVER THE COUNTER MEDICATION Take 1 tablet by mouth daily. Cinnamon daily     OVER THE COUNTER MEDICATION Take 1 tablet by mouth at bedtime. alteril natural sleep aide at bedtime     Oxycodone HCl 10 MG TABS Take 10 mg by mouth 5 (five) times daily.  0   promethazine (PHENERGAN) 25 MG tablet Take 25 mg by mouth every 6 (six) hours as needed for nausea or vomiting.     zolpidem (AMBIEN CR) 6.25 MG CR tablet Take 6.25 mg by mouth at bedtime.   5   No current facility-administered medications on file prior to visit.    Allergies:   Allergies  Allergen Reactions   Sulfa Antibiotics Anaphylaxis      OBJECTIVE:  Physical Exam  There were no vitals filed for this visit.  There is no height or weight on file to calculate BMI.  General: well developed, well nourished,  pleasant middle-age Caucasian female, seated, in no evident distress Head: head normocephalic and atraumatic.   Neck: supple with no carotid or supraclavicular bruits Cardiovascular: regular rate and rhythm, no murmurs Musculoskeletal: no deformity Skin:  no rash/petichiae Vascular:  Normal pulses all  extremities   Neurologic Exam Mental Status: Awake and fully alert.   Fluent speech and language.  Oriented to place and time. Recent memory subjectively impaired and remote memory intact. Attention span, concentration and fund of knowledge appropriate. Mood and affect appropriate.  Cranial Nerves: Pupils equal, briskly reactive to light. Extraocular movements full without nystagmus. Visual fields full to confrontation. Hearing intact. Facial sensation intact. Face, tongue, palate moves normally and symmetrically.  Motor: Normal bulk and tone. Normal strength in all tested extremity muscles Sensory.: intact to touch , pinprick , position and vibratory sensation.  Subjective "odd sensation" with light touch RUE distally Coordination: Rapid alternating movements normal in all extremities. Finger-to-nose and heel-to-shin performed accurately bilaterally. Gait and Station: Arises from chair without difficulty. Stance is normal. Gait demonstrates normal stride length and balance without use of assistive device Reflexes: 1+ and symmetric. Toes downgoing.        ASSESSMENT: Deborah Peters is a 65 y.o. 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  Vascular risk factors include HTN, HLD, DM, prior stroke on imaging, current tobacco use, history of EtOH use and migraines.      PLAN:  Left thalamic stroke:  Residual deficit: RUE sensory impairment and mild cognitive impairment.  Provided exercises to continue at home and discussed typical recovery time.  Also discussed prior stroke, chronic pain, underlying depression/anxiety and stressors can contribute to short-term memory loss Continue clopidogrel 75 mg daily  and atorvastatin 80 mg daily, Zetia 10 mg daily and fish oil for secondary stroke prevention.   Discussed secondary stroke prevention measures and  importance of close PCP follow up for aggressive stroke risk  factor management  HTN: BP goal <130/90.  Stable monitor by PCP HLD: LDL goal <70. Recent direct LDL 95.1 therefore transitioned from pravastatin to atorvastatin 80 mg daily during recent stroke admission.  Advised to continue atorvastatin with routine follow-up and prescribing by PCP DMII: A1c goal<7.0.  Well-controlled with recent A1c 5.7.  Tobacco use: Discussed importance of complete tobacco cessation.  Patient wishes to quit and advised to further discuss with PCP if additional assistance needed    Follow up in 4 months or call earlier if needed  CC:  [facility] provider: Dr. Mont Dutton, MD     I spent 45 minutes of face-to-face and non-face-to-face time with patient and husband.  This included previsit chart review including recent hospitalization pertinent progress notes, lab work and imaging, lab review, study review, order entry, electronic health record documentation, patient education regarding recent stroke including etiology, residual deficits, importance of managing stroke risk factors and answered all other questions to patient and husband's satisfaction   Frann Rider, AGNP-BC  [facility] [address] [place], [place] [address]  Phone [phone] Fax [phone] Note: This document was prepared with digital dictation and possible smart phrase technology. Any transcriptional errors that result from this process are unintentional.

## 2020-09-16 NOTE — Telephone Encounter (Signed)
Pt called needing to r/s her appt for today due to having a fever, sore throat and cough. Pt's brother was visiting and has tested positive for Covid. Pt r/s to the beginning of Sept.

## 2020-10-01 DIAGNOSIS — E785 Hyperlipidemia, unspecified: Secondary | ICD-10-CM | POA: Diagnosis not present

## 2020-10-01 DIAGNOSIS — E1169 Type 2 diabetes mellitus with other specified complication: Secondary | ICD-10-CM | POA: Diagnosis not present

## 2020-10-15 DIAGNOSIS — Z1212 Encounter for screening for malignant neoplasm of rectum: Secondary | ICD-10-CM | POA: Diagnosis not present

## 2020-10-15 DIAGNOSIS — R82998 Other abnormal findings in urine: Secondary | ICD-10-CM | POA: Diagnosis not present

## 2020-10-15 DIAGNOSIS — I2510 Atherosclerotic heart disease of native coronary artery without angina pectoris: Secondary | ICD-10-CM | POA: Diagnosis not present

## 2020-10-15 DIAGNOSIS — Z0000 Encounter for general adult medical examination without abnormal findings: Secondary | ICD-10-CM | POA: Diagnosis not present

## 2020-10-15 DIAGNOSIS — Z1331 Encounter for screening for depression: Secondary | ICD-10-CM | POA: Diagnosis not present

## 2020-10-15 DIAGNOSIS — F329 Major depressive disorder, single episode, unspecified: Secondary | ICD-10-CM | POA: Diagnosis not present

## 2020-10-15 DIAGNOSIS — R011 Cardiac murmur, unspecified: Secondary | ICD-10-CM | POA: Diagnosis not present

## 2020-10-15 DIAGNOSIS — R911 Solitary pulmonary nodule: Secondary | ICD-10-CM | POA: Diagnosis not present

## 2020-10-15 DIAGNOSIS — I700 Atherosclerosis of aorta: Secondary | ICD-10-CM | POA: Diagnosis not present

## 2020-10-15 DIAGNOSIS — E785 Hyperlipidemia, unspecified: Secondary | ICD-10-CM | POA: Diagnosis not present

## 2020-10-15 DIAGNOSIS — R0600 Dyspnea, unspecified: Secondary | ICD-10-CM | POA: Diagnosis not present

## 2020-10-15 DIAGNOSIS — E119 Type 2 diabetes mellitus without complications: Secondary | ICD-10-CM | POA: Diagnosis not present

## 2020-10-15 DIAGNOSIS — I10 Essential (primary) hypertension: Secondary | ICD-10-CM | POA: Diagnosis not present

## 2020-10-15 DIAGNOSIS — E1169 Type 2 diabetes mellitus with other specified complication: Secondary | ICD-10-CM | POA: Diagnosis not present

## 2020-10-15 DIAGNOSIS — J329 Chronic sinusitis, unspecified: Secondary | ICD-10-CM | POA: Diagnosis not present

## 2020-10-18 DIAGNOSIS — R55 Syncope and collapse: Secondary | ICD-10-CM | POA: Diagnosis not present

## 2020-10-18 DIAGNOSIS — I2510 Atherosclerotic heart disease of native coronary artery without angina pectoris: Secondary | ICD-10-CM | POA: Diagnosis not present

## 2020-10-18 DIAGNOSIS — R911 Solitary pulmonary nodule: Secondary | ICD-10-CM | POA: Diagnosis not present

## 2020-10-18 DIAGNOSIS — I700 Atherosclerosis of aorta: Secondary | ICD-10-CM | POA: Diagnosis not present

## 2020-10-18 DIAGNOSIS — E1169 Type 2 diabetes mellitus with other specified complication: Secondary | ICD-10-CM | POA: Diagnosis not present

## 2020-10-18 DIAGNOSIS — I959 Hypotension, unspecified: Secondary | ICD-10-CM | POA: Diagnosis not present

## 2020-10-18 DIAGNOSIS — R0600 Dyspnea, unspecified: Secondary | ICD-10-CM | POA: Diagnosis not present

## 2020-10-18 DIAGNOSIS — R635 Abnormal weight gain: Secondary | ICD-10-CM | POA: Diagnosis not present

## 2020-10-18 DIAGNOSIS — R059 Cough, unspecified: Secondary | ICD-10-CM | POA: Diagnosis not present

## 2020-10-26 ENCOUNTER — Other Ambulatory Visit (HOSPITAL_COMMUNITY): Payer: Self-pay | Admitting: Family Medicine

## 2020-10-26 DIAGNOSIS — R55 Syncope and collapse: Secondary | ICD-10-CM | POA: Diagnosis not present

## 2020-10-26 DIAGNOSIS — I959 Hypotension, unspecified: Secondary | ICD-10-CM | POA: Diagnosis not present

## 2020-10-26 DIAGNOSIS — I5033 Acute on chronic diastolic (congestive) heart failure: Secondary | ICD-10-CM | POA: Diagnosis not present

## 2020-10-26 DIAGNOSIS — R0600 Dyspnea, unspecified: Secondary | ICD-10-CM

## 2020-10-26 DIAGNOSIS — R635 Abnormal weight gain: Secondary | ICD-10-CM | POA: Diagnosis not present

## 2020-10-26 DIAGNOSIS — R911 Solitary pulmonary nodule: Secondary | ICD-10-CM | POA: Diagnosis not present

## 2020-10-26 DIAGNOSIS — E1169 Type 2 diabetes mellitus with other specified complication: Secondary | ICD-10-CM | POA: Diagnosis not present

## 2020-10-26 DIAGNOSIS — N390 Urinary tract infection, site not specified: Secondary | ICD-10-CM | POA: Diagnosis not present

## 2020-10-26 DIAGNOSIS — R059 Cough, unspecified: Secondary | ICD-10-CM | POA: Diagnosis not present

## 2020-10-26 DIAGNOSIS — I10 Essential (primary) hypertension: Secondary | ICD-10-CM | POA: Diagnosis not present

## 2020-10-26 DIAGNOSIS — I2510 Atherosclerotic heart disease of native coronary artery without angina pectoris: Secondary | ICD-10-CM | POA: Diagnosis not present

## 2020-10-26 DIAGNOSIS — Z8616 Personal history of COVID-19: Secondary | ICD-10-CM | POA: Diagnosis not present

## 2020-11-01 NOTE — Progress Notes (Deleted)
Date:  11/01/2020   ID:  Deborah Peters, DOB 11-Jun-1955, MRN [id number]  PCP:  Crist Infante, MD  Cardiologist:  Rex Kras, DO, [facility] (established care 11/02/2020) Former Cardiology Providers: ***  REASON FOR CONSULT: Dyspnea  REQUESTING PHYSICIAN:  Crist Infante, MD [place],  [place] [address]  No chief complaint on file.   HPI  Deborah Peters is a 65 y.o. female who presents to the office with a chief complaint of "***." Patient's past medical history and cardiovascular risk factors include: cerebrovascular accident, ***  She is referred to the office at the request of Crist Infante, MD for evaluation of dyspnea.  ***  History of  Denies prior history of coronary artery disease, myocardial infarction, congestive heart failure, deep venous thrombosis, pulmonary embolism, stroke, transient ischemic attack.  FUNCTIONAL STATUS: ***   ALLERGIES: Allergies  Allergen Reactions   Sulfa Antibiotics Anaphylaxis    MEDICATION LIST PRIOR TO VISIT: No outpatient medications have been marked as taking for the 11/02/20 encounter (Appointment) with Rex Kras, DO.     PAST MEDICAL HISTORY: Past Medical History:  Diagnosis Date   Allergy    Chronic back pain    Constipation due to opioid therapy    DDD (degenerative disc disease), lumbar    Depression    Diabetes mellitus, type 2 ([place])    Fatty liver    GERD (gastroesophageal reflux disease)    past hx of    Gestational diabetes    no meds as of 06-01-2016.  diet controlled   Heart murmur    Heart murmur    Hyperlipidemia    Hypertension    pt this pain related - id occasionally high    IFG (impaired fasting glucose)    Meralgia paraesthetica    Migraines    Post-menopausal    Scoliosis    Ulcer    years ago. gastric ulcer.    PAST SURGICAL HISTORY: Past Surgical History:  Procedure Laterality Date   ABDOMINAL HYSTERECTOMY     has her ovaries   BACK SURGERY  2011   L5 S1   COLONOSCOPY     JOINT  REPLACEMENT     L knee   knee surgeries     x 8 before replacement    POLYPECTOMY     HPP in 2008 DB    FAMILY HISTORY: The patient family history includes Cancer in her maternal grandfather and maternal grandmother; Colon polyps in her brother and sister; Stomach cancer in her paternal grandfather.  SOCIAL HISTORY:  The patient  reports that she has quit smoking. She has never used smokeless tobacco. She reports previous alcohol use. She reports that she does not use drugs.  REVIEW OF SYSTEMS: ROS  PHYSICAL EXAM: Vitals with BMI 05/17/2020 03/21/2020 03/21/2020  Height '5\' 6"'$  - -  Weight 153 lbs - -  BMI 0000000 - -  Systolic 99991111 - [id number]  Diastolic 67 - [id number]  Pulse 90 79 -    CONSTITUTIONAL: Well-developed and well-nourished. No acute distress.  SKIN: Skin is warm and dry. No rash noted. No cyanosis. No pallor. No jaundice HEAD: Normocephalic and atraumatic.  EYES: No scleral icterus MOUTH/THROAT: Moist oral membranes.  NECK: No JVD present. No thyromegaly noted. No carotid bruits  LYMPHATIC: No visible cervical adenopathy.  CHEST Normal respiratory effort. No intercostal retractions  LUNGS: *** No stridor. No wheezes. No rales.  CARDIOVASCULAR: *** ABDOMINAL: No apparent ascites.  EXTREMITIES: No peripheral edema  HEMATOLOGIC: No significant  bruising NEUROLOGIC: Oriented to person, place, and time. Nonfocal. Normal muscle tone.  PSYCHIATRIC: Normal mood and affect. Normal behavior. Cooperative  CARDIAC DATABASE: EKG: ***  Echocardiogram: No results found for this or any previous visit from the past 1095 days.    Stress Testing: No results found for this or any previous visit from the past 1095 days.   Heart Catheterization: ***  LABORATORY DATA: CBC Latest Ref Rng & Units 03/21/2020 03/21/2020 03/17/2020  WBC 4.0 - 10.5 K/uL - 7.9 8.3  Hemoglobin 12.0 - 15.0 g/dL 12.6 12.0 12.8  Hematocrit 36.0 - 46.0 % 37.0 37.3 39.4  Platelets 150 - 400 K/uL - 246 271     CMP Latest Ref Rng & Units 03/21/2020 03/21/2020 03/17/2020  Glucose 70 - 99 mg/dL 124(H) 134(H) -  BUN 8 - 23 mg/dL 3(L) <5(L) -  Creatinine 0.44 - 1.00 mg/dL 0.60 0.72 0.68  Sodium 135 - 145 mmol/L 139 138 -  Potassium 3.5 - 5.1 mmol/L 3.9 3.9 -  Chloride 98 - 111 mmol/L 105 106 -  CO2 22 - 32 mmol/L - 22 -  Calcium 8.9 - 10.3 mg/dL - 9.1 -  Total Protein 6.5 - 8.1 g/dL - 6.2(L) -  Total Bilirubin 0.3 - 1.2 mg/dL - 0.4 -  Alkaline Phos 38 - 126 U/L - 46 -  AST 15 - 41 U/L - 17 -  ALT 0 - 44 U/L - 15 -    Lipid Panel     Component Value Date/Time   CHOL 174 03/18/2020 0440   TRIG 437 (H) 03/18/2020 0440   HDL 30 (L) 03/18/2020 0440   CHOLHDL 5.8 03/18/2020 0440   VLDL UNABLE TO CALCULATE IF TRIGLYCERIDE OVER 400 mg/dL 03/18/2020 0440   LDLCALC UNABLE TO CALCULATE IF TRIGLYCERIDE OVER 400 mg/dL 03/18/2020 0440   LDLDIRECT 95.1 03/18/2020 0440    No components found for: NTPROBNP No results for input(s): PROBNP in the last 8760 hours. No results for input(s): TSH in the last 8760 hours.  BMP Recent Labs    03/13/20 1716 03/17/20 1743 03/17/20 1810 03/17/20 2209 03/21/20 1304 03/21/20 1307  NA 139 139 139  --  138 139  K 3.6 3.9 3.9  --  3.9 3.9  CL 106 104 104  --  106 105  CO2 25 25  --   --  22  --   GLUCOSE 115* 90 84  --  134* 124*  BUN 8 6* 6*  --  <5* 3*  CREATININE 0.53 0.70 0.70 0.68 0.72 0.60  CALCIUM 9.1 9.5  --   --  9.1  --   GFRNONAA >60 >60  --  >60 >60  --     HEMOGLOBIN A1C Lab Results  Component Value Date   HGBA1C 5.7 (H) 03/18/2020   MPG 116.89 03/18/2020    IMPRESSION:  No diagnosis found.   RECOMMENDATIONS: Deborah Peters is a 65 y.o. female whose past medical history and cardiac risk factors include: ***   FINAL MEDICATION LIST END OF ENCOUNTER: No orders of the defined types were placed in this encounter.   There are no discontinued medications.   Current Outpatient Medications:    ascorbic acid (VITAMIN C) 500 MG  tablet, Take 500 mg by mouth daily., Disp: , Rfl:    atorvastatin (LIPITOR) 80 MG tablet, Take 1 tablet (80 mg total) by mouth at bedtime., Disp: 30 tablet, Rfl: 1   benzonatate (TESSALON) 100 MG capsule, Take 100 mg by mouth 3 (  three) times daily as needed for cough., Disp: , Rfl:    clopidogrel (PLAVIX) 75 MG tablet, Take 75 mg by mouth daily., Disp: , Rfl:    diazepam (VALIUM) 5 MG tablet, Take 5 mg by mouth every 8 (eight) hours as needed for anxiety or muscle spasms., Disp: , Rfl:    DULoxetine (CYMBALTA) 60 MG capsule, Take 60 mg by mouth daily., Disp: , Rfl:    ezetimibe (ZETIA) 10 MG tablet, Take 10 mg by mouth daily., Disp: , Rfl: 3   fish oil-omega-3 fatty acids 1000 MG capsule, Take 2 g by mouth 2 (two) times daily. , Disp: , Rfl:    gabapentin (NEURONTIN) 400 MG capsule, Take 400 mg by mouth 3 (three) times daily., Disp: , Rfl:    magnesium 30 MG tablet, Take 30 mg by mouth 2 (two) times daily., Disp: , Rfl:    OVER THE COUNTER MEDICATION, Take 1 capsule by mouth daily. Tumeric, Disp: , Rfl:    OVER THE COUNTER MEDICATION, Take 1 tablet by mouth daily. Cinnamon daily, Disp: , Rfl:    OVER THE COUNTER MEDICATION, Take 1 tablet by mouth at bedtime. alteril natural sleep aide at bedtime, Disp: , Rfl:    Oxycodone HCl 10 MG TABS, Take 10 mg by mouth 5 (five) times daily., Disp: , Rfl: 0   promethazine (PHENERGAN) 25 MG tablet, Take 25 mg by mouth every 6 (six) hours as needed for nausea or vomiting., Disp: , Rfl:    zolpidem (AMBIEN CR) 6.25 MG CR tablet, Take 6.25 mg by mouth at bedtime. , Disp: , Rfl: 5  No orders of the defined types were placed in this encounter.   There are no Patient Instructions on file for this visit.   --Continue cardiac medications as reconciled in final medication list. --No follow-ups on file. Or sooner if needed. --Continue follow-up with your primary care physician regarding the management of your other chronic comorbid conditions.  Patient's questions  and concerns were addressed to her satisfaction. She voices understanding of the instructions provided during this encounter.   This note was created using a voice recognition software as a result there may be grammatical errors inadvertently enclosed that do not reflect the nature of this encounter. Every attempt is made to correct such errors.  Rex Kras, [place], [facility]  Pager: [phone] Office: [phone]

## 2020-11-02 ENCOUNTER — Ambulatory Visit: Payer: Self-pay | Admitting: Cardiology

## 2020-11-05 DIAGNOSIS — M48061 Spinal stenosis, lumbar region without neurogenic claudication: Secondary | ICD-10-CM | POA: Diagnosis not present

## 2020-11-05 DIAGNOSIS — M961 Postlaminectomy syndrome, not elsewhere classified: Secondary | ICD-10-CM | POA: Diagnosis not present

## 2020-11-05 DIAGNOSIS — R030 Elevated blood-pressure reading, without diagnosis of hypertension: Secondary | ICD-10-CM | POA: Diagnosis not present

## 2020-11-05 DIAGNOSIS — F1120 Opioid dependence, uncomplicated: Secondary | ICD-10-CM | POA: Diagnosis not present

## 2020-11-08 ENCOUNTER — Ambulatory Visit (HOSPITAL_COMMUNITY): Payer: PPO | Attending: Internal Medicine

## 2020-11-08 ENCOUNTER — Other Ambulatory Visit: Payer: Self-pay

## 2020-11-08 ENCOUNTER — Ambulatory Visit: Payer: PPO | Admitting: Cardiology

## 2020-11-08 ENCOUNTER — Encounter: Payer: Self-pay | Admitting: Cardiology

## 2020-11-08 VITALS — BP 128/47 | HR 85 | Resp 16 | Ht 66.0 in | Wt 173.2 lb

## 2020-11-08 DIAGNOSIS — I2584 Coronary atherosclerosis due to calcified coronary lesion: Secondary | ICD-10-CM | POA: Diagnosis not present

## 2020-11-08 DIAGNOSIS — R55 Syncope and collapse: Secondary | ICD-10-CM | POA: Diagnosis not present

## 2020-11-08 DIAGNOSIS — R072 Precordial pain: Secondary | ICD-10-CM | POA: Diagnosis not present

## 2020-11-08 DIAGNOSIS — Z8673 Personal history of transient ischemic attack (TIA), and cerebral infarction without residual deficits: Secondary | ICD-10-CM | POA: Diagnosis not present

## 2020-11-08 DIAGNOSIS — I10 Essential (primary) hypertension: Secondary | ICD-10-CM | POA: Diagnosis not present

## 2020-11-08 DIAGNOSIS — R059 Cough, unspecified: Secondary | ICD-10-CM | POA: Diagnosis not present

## 2020-11-08 DIAGNOSIS — I700 Atherosclerosis of aorta: Secondary | ICD-10-CM | POA: Diagnosis not present

## 2020-11-08 DIAGNOSIS — R0600 Dyspnea, unspecified: Secondary | ICD-10-CM | POA: Diagnosis not present

## 2020-11-08 DIAGNOSIS — E1169 Type 2 diabetes mellitus with other specified complication: Secondary | ICD-10-CM | POA: Diagnosis not present

## 2020-11-08 DIAGNOSIS — R635 Abnormal weight gain: Secondary | ICD-10-CM | POA: Diagnosis not present

## 2020-11-08 DIAGNOSIS — R911 Solitary pulmonary nodule: Secondary | ICD-10-CM | POA: Diagnosis not present

## 2020-11-08 DIAGNOSIS — I2510 Atherosclerotic heart disease of native coronary artery without angina pectoris: Secondary | ICD-10-CM

## 2020-11-08 DIAGNOSIS — Z87891 Personal history of nicotine dependence: Secondary | ICD-10-CM | POA: Diagnosis not present

## 2020-11-08 DIAGNOSIS — I959 Hypotension, unspecified: Secondary | ICD-10-CM | POA: Diagnosis not present

## 2020-11-08 DIAGNOSIS — R0602 Shortness of breath: Secondary | ICD-10-CM

## 2020-11-08 LAB — ECHOCARDIOGRAM COMPLETE
Area-P 1/2: 2.83 cm2
S' Lateral: 2.6 cm

## 2020-11-08 MED ORDER — METOPROLOL TARTRATE 25 MG PO TABS: 25.0000 mg | ORAL_TABLET | Freq: Two times a day (BID) | ORAL | 0 refills | Status: DC

## 2020-11-08 NOTE — Progress Notes (Signed)
Date:  11/08/2020   ID:  Loraine Leriche, DOB 1955-10-02, MRN [id number]  PCP:  Crist Infante, MD  Cardiologist:  Rex Kras, DO, [facility] (established care 11/08/2020)  REASON FOR CONSULT: Dyspnea  REQUESTING PHYSICIAN:  Crist Infante, MD [address] [place],  [place] [address]  Chief Complaint  Patient presents with   Shortness of Breath   New Patient (Initial Visit)    Referred by Claud Kelp    HPI  Deborah Peters is a 65 y.o. female who presents to the office with a chief complaint of " shortness of breath." Patient's past medical history and cardiovascular risk factors include: Hx of COVID (March 2020), Mild coronary artery calcification, hyperlipidemia, history of stroke (03/10/2020), aortic atherosclerosis, postmenopausal female, advanced age, former smoker.  She is referred to the office at the request of Crist Infante, MD for evaluation of dyspnea.  After plane ride in April 2022 she has been experiencing lower extremity swelling.  She initially noticed a 15 pound weight gain over the course of 6 days and went to her PCP and was initiated on Lasix.  The dose of Lasix was later uptitrated to 40 mg p.o. daily which resulted in 6-7 pounds of weight loss.  She now presents to the office for evaluation of congestive heart failure.  Patient states that recently she has gained weight and notes lower extremity swelling.  She denies orthopnea or paroxysmal nocturnal dyspnea.  She had an echocardiogram earlier this morning which notes preserved LVEF with normal diastolic pattern and normal left atrial pressure.  She also had blood work at Dr. Elta Guadeloupe Perini's office earlier this morning results pending.  Review of systems positive for chest tightness.  Described as a bandlike distribution over both breasts, intensity 4 out of 10, lasting for a few minutes, occurs once or twice a week, nonradiating, not brought on by effort related activities, usually self-limited.  She also complains of  symptoms of near syncope/syncope.  Last episode was prior to October 26, 2020.  She is on pharmacological agents that could be contributing which include Valium, Cymbalta, Lasix, oxycodone, delta 8 over-the-counter supplement.  Patient does have a history of stroke as described above.  And family history of CAD with father undergoing CABG at the age of 52.  FUNCTIONAL STATUS: No structured exercise program or daily routine.  ALLERGIES: Allergies  Allergen Reactions   Sulfa Antibiotics Anaphylaxis    MEDICATION LIST PRIOR TO VISIT: Current Meds  Medication Sig   ascorbic acid (VITAMIN C) 500 MG tablet Take 500 mg by mouth daily.   atorvastatin (LIPITOR) 80 MG tablet Take 1 tablet (80 mg total) by mouth at bedtime.   benzonatate (TESSALON) 100 MG capsule Take 100 mg by mouth 3 (three) times daily as needed for cough.   clopidogrel (PLAVIX) 75 MG tablet Take 75 mg by mouth daily.   diazepam (VALIUM) 5 MG tablet Take 5 mg by mouth every 8 (eight) hours as needed for anxiety or muscle spasms.   DULoxetine (CYMBALTA) 60 MG capsule Take 60 mg by mouth daily.   ezetimibe (ZETIA) 10 MG tablet Take 10 mg by mouth daily.   fish oil-omega-3 fatty acids 1000 MG capsule Take 2 g by mouth 2 (two) times daily.    furosemide (LASIX) 40 MG tablet Take 1 tablet by mouth daily.   gabapentin (NEURONTIN) 400 MG capsule Take 400 mg by mouth in the morning, at noon, in the evening, and at bedtime.   magnesium 30 MG tablet  Take 30 mg by mouth 2 (two) times daily.   metoprolol tartrate (LOPRESSOR) 25 MG tablet Take 1 tablet (25 mg total) by mouth 2 (two) times daily for 10 days. Hold if systolic blood pressure (top number) less than 100 mmHg or pulse less than 60 bpm.   OVER THE COUNTER MEDICATION Take 1 capsule by mouth daily. Tumeric   OVER THE COUNTER MEDICATION Take 1 tablet by mouth daily. Cinnamon daily   OVER THE COUNTER MEDICATION Take 1 tablet by mouth at bedtime. alteril natural sleep aide at bedtime    Oxycodone HCl 10 MG TABS Take 10 mg by mouth 5 (five) times daily.   pantoprazole (PROTONIX) 40 MG tablet Take 1 tablet by mouth as needed.   promethazine (PHENERGAN) 25 MG tablet Take 25 mg by mouth every 6 (six) hours as needed for nausea or vomiting.   zolpidem (AMBIEN CR) 6.25 MG CR tablet Take 6.25 mg by mouth at bedtime.      PAST MEDICAL HISTORY: Past Medical History:  Diagnosis Date   Allergy    Chronic back pain    Constipation due to opioid therapy    DDD (degenerative disc disease), lumbar    Depression    Diabetes mellitus, type 2 ([place])    Fatty liver    GERD (gastroesophageal reflux disease)    past hx of    Gestational diabetes    no meds as of 06-01-2016.  diet controlled   Heart murmur    Heart murmur    Hyperlipidemia    Hypertension    pt this pain related - id occasionally high    IFG (impaired fasting glucose)    Meralgia paraesthetica    Migraines    Post-menopausal    Scoliosis    Stroke ([place])    Ulcer    years ago. gastric ulcer.    PAST SURGICAL HISTORY: Past Surgical History:  Procedure Laterality Date   ABDOMINAL HYSTERECTOMY     has her ovaries   BACK SURGERY  2011   L5 S1   COLONOSCOPY     JOINT REPLACEMENT     L knee   knee surgeries     x 8 before replacement    POLYPECTOMY     HPP in 2008 DB    FAMILY HISTORY: The patient family history includes Cancer in her maternal grandfather and maternal grandmother; Colon polyps in her brother and sister; Stomach cancer in her paternal grandfather.  SOCIAL HISTORY:  The patient  reports that she has quit smoking. She has never used smokeless tobacco. She reports that she does not currently use alcohol. She reports that she does not use drugs.  REVIEW OF SYSTEMS: Review of Systems  Constitutional: Positive for weight gain. Negative for chills and fever.  HENT:  Negative for hoarse voice and nosebleeds.   Eyes:  Negative for discharge, double vision and pain.  Cardiovascular:  Positive for  chest pain (see above), dyspnea on exertion and leg swelling. Negative for claudication, near-syncope, orthopnea, palpitations, paroxysmal nocturnal dyspnea and syncope.  Respiratory:  Negative for hemoptysis and shortness of breath.   Musculoskeletal:  Negative for muscle cramps and myalgias.  Gastrointestinal:  Negative for abdominal pain, constipation, diarrhea, hematemesis, hematochezia, melena, nausea and vomiting.  Neurological:  Negative for dizziness and light-headedness.   PHYSICAL EXAM: Vitals with BMI 11/08/2020 05/17/2020 03/21/2020  Height 5\' 6"  5\' 6"  -  Weight 173 lbs 3 oz 153 lbs -  BMI 09.47 09.62 -  Systolic 836 629 -  Diastolic 47 67 -  Pulse 85 90 79   Orthostatic VS for the past 72 hrs (Last 3 readings):  Orthostatic BP Patient Position BP Location Cuff Size Orthostatic Pulse  11/08/20 1308 112/60 Standing Left Arm Large 87  11/08/20 1307 -- Sitting Left Arm Large --  11/08/20 1305 114/45 Supine Left Arm Large 83     CONSTITUTIONAL: Well-developed and well-nourished. No acute distress.  SKIN: Skin is warm and dry. No rash noted. No cyanosis. No pallor. No jaundice HEAD: Normocephalic and atraumatic.  EYES: No scleral icterus MOUTH/THROAT: Moist oral membranes.  NECK: No JVP present. No thyromegaly noted. No carotid bruits  LYMPHATIC: No visible cervical adenopathy.  CHEST Normal respiratory effort. No intercostal retractions  LUNGS: Clear to auscultation bilaterally.  No stridor. No wheezes. No rales.  CARDIOVASCULAR: Regular, positive S1-S2, no murmurs rubs or gallops appreciated. ABDOMINAL: Soft, nontender, nondistended, positive bowel sounds in all 4 quadrants, no apparent ascites.  EXTREMITIES: No peripheral edema, warm to touch, 2+ DP and PT pulses bilaterally. HEMATOLOGIC: No significant bruising NEUROLOGIC: Oriented to person, place, and time. Nonfocal. Normal muscle tone.  PSYCHIATRIC: Normal mood and affect. Normal behavior. Cooperative  CARDIAC  DATABASE: EKG: 11/08/2020: Normal sinus rhythm, 84 bpm, normal axis, without underlying ischemia or injury pattern.  Echocardiogram: 11/08/2020:  1. Left ventricular ejection fraction, by estimation, is 65 to 70%. The left ventricle has normal function. There is mild left ventricular hypertrophy. Left ventricular diastolic parameters were normal.   2. Right ventricular systolic function is normal. The right ventricular size is normal.   3. The mitral valve is normal in structure. Trivial mitral valve regurgitation.   4. The aortic valve is normal in structure. Aortic valve regurgitation is not visualized.    Stress Testing: No results found for this or any previous visit from the past 1095 days.   Heart Catheterization: None  RADIOLOGY: MRI brain without contrast: 03/16/2020: 1. 1 cm acute to early subacute ischemic left thalamocapsular infarct. No associated hemorrhage or mass effect. 2. Additional remote lacunar infarct involving the adjacent left basal ganglia. 3. Underlying mild chronic microvascular ischemic disease.  MRA head without contrast: 03/17/2020 1. 3 mm left paraophthalmic artery aneurysm. 2. Mild atherosclerotic irregularity within the cavernous internal carotid arteries without significant stenosis. 3. Otherwise normal MRA Circle of Willis.  LABORATORY DATA: External labs provided by PCP. 10/18/2020: BUN 6, creatinine 0.8, sodium 138, potassium 4.7, chloride 103, bicarb 25 Hemoglobin 12.2 g/dL, hematocrit 34.6% NT proBNP 47  10/01/2020:  Total cholesterol 112, triglycerides 156, HDL 35, LDL 46, non-HDL 77. Apolipoprotein B 69 (within normal limits) TSH 1.69 A1c 6.4  IMPRESSION:    ICD-10-CM   1. Shortness of breath  R06.02 EKG 12-Lead    2. Precordial pain  R07.2 CT CORONARY MORPH W/CTA COR W/SCORE W/CA W/CM &/OR WO/CM    metoprolol tartrate (LOPRESSOR) 25 MG tablet    3. Coronary artery calcification of native artery  I25.10    I25.84     4.  Atherosclerosis of aorta (HCC)  I70.0     5. History of stroke  Z86.73     6. Former smoker  Z87.891        RECOMMENDATIONS: SHURONDA SANTINO is a 65 y.o. female whose past medical history and cardiac risk factors include: Hx of COVID (March 2020), Mild coronary artery calcification, hyperlipidemia, history of stroke (03/10/2020), aortic atherosclerosis, postmenopausal female, advanced age, former smoker.  Shortness of breath: Patient is referred to the office for evaluation of congestive  heart failure.  Clinically my suspicion for CHF is low as the patient does not experience orthopnea, paroxysmal nocturnal dyspnea.  She does complain of lower extremity swelling currently; however, on physical examination no swelling present.  No JVP on examination either.  She has not had an echocardiogram earlier this morning which notes preserved LVEF and normal diastolic pattern and no evidence of elevated left atrial pressure.  NT proBNP which was checked prior to initiating diuretic therapy was within normal limits.  Patient also had labs with her PCP earlier this morning which are still pending.  Would like to also check his BNP.  Her symptoms started back in April 2022 after taking a flight ([place] to [place] to [facility]).  Though suspicion for pulmonary embolism is low would recommend checking a D-dimer.  Our office tried to contact her PCP to see if these lab can be ordered awaiting response.  Given the results of the D-dimer we will consider either checking a CT study or a VQ scan.  Precordial chest pain: Patient symptoms of chest discomfort appear to be nonspecific. She had a coronary calcium score which was mild back in January 2022. Patient states that she is unable to exercise on a treadmill and therefore will proceed with coronary CTA to evaluate for obstructive CAD.  History of stroke: Currently on aspirin, Plavix, statin therapy, Zetia.  Patient complained that she has had episodes of syncope during which  she may pass out up to 15 minutes.  However on further questioning patient's husband states that she may be just falling asleep.  She is on multiple medications that can predispose her to being drowsy and/or soft blood pressures such as Valium, duloxetine, Lasix, gabapentin, oxycodone.  I have asked her to space these medications throughout the day.  Since she has not had any episodes since October 26, 2020 the shared decision was to hold off on proceeding with a monitor as it would be low yield given how infrequent the episodes have become.  As a part of this consultation reviewed outside records provided by the referring provider which included but not limited to office notes, labs, and prior to testing diagnostic testing results via epic were also reviewed.  These were taken into account with regards to medical decision making and pertinent findings were independently summarized and noted above for reference.  Plan of care discussed with both the patient and her husband at today's office visit and both are in agreement.  FINAL MEDICATION LIST END OF ENCOUNTER: Meds ordered this encounter  Medications   metoprolol tartrate (LOPRESSOR) 25 MG tablet    Sig: Take 1 tablet (25 mg total) by mouth 2 (two) times daily for 10 days. Hold if systolic blood pressure (top number) less than 100 mmHg or pulse less than 60 bpm.    Dispense:  20 tablet    Refill:  0     There are no discontinued medications.   Current Outpatient Medications:    ascorbic acid (VITAMIN C) 500 MG tablet, Take 500 mg by mouth daily., Disp: , Rfl:    atorvastatin (LIPITOR) 80 MG tablet, Take 1 tablet (80 mg total) by mouth at bedtime., Disp: 30 tablet, Rfl: 1   benzonatate (TESSALON) 100 MG capsule, Take 100 mg by mouth 3 (three) times daily as needed for cough., Disp: , Rfl:    clopidogrel (PLAVIX) 75 MG tablet, Take 75 mg by mouth daily., Disp: , Rfl:    diazepam (VALIUM) 5 MG tablet, Take 5 mg by  mouth every 8 (eight) hours as  needed for anxiety or muscle spasms., Disp: , Rfl:    DULoxetine (CYMBALTA) 60 MG capsule, Take 60 mg by mouth daily., Disp: , Rfl:    ezetimibe (ZETIA) 10 MG tablet, Take 10 mg by mouth daily., Disp: , Rfl: 3   fish oil-omega-3 fatty acids 1000 MG capsule, Take 2 g by mouth 2 (two) times daily. , Disp: , Rfl:    furosemide (LASIX) 40 MG tablet, Take 1 tablet by mouth daily., Disp: , Rfl:    gabapentin (NEURONTIN) 400 MG capsule, Take 400 mg by mouth in the morning, at noon, in the evening, and at bedtime., Disp: , Rfl:    magnesium 30 MG tablet, Take 30 mg by mouth 2 (two) times daily., Disp: , Rfl:    metoprolol tartrate (LOPRESSOR) 25 MG tablet, Take 1 tablet (25 mg total) by mouth 2 (two) times daily for 10 days. Hold if systolic blood pressure (top number) less than 100 mmHg or pulse less than 60 bpm., Disp: 20 tablet, Rfl: 0   OVER THE COUNTER MEDICATION, Take 1 capsule by mouth daily. Tumeric, Disp: , Rfl:    OVER THE COUNTER MEDICATION, Take 1 tablet by mouth daily. Cinnamon daily, Disp: , Rfl:    OVER THE COUNTER MEDICATION, Take 1 tablet by mouth at bedtime. alteril natural sleep aide at bedtime, Disp: , Rfl:    Oxycodone HCl 10 MG TABS, Take 10 mg by mouth 5 (five) times daily., Disp: , Rfl: 0   pantoprazole (PROTONIX) 40 MG tablet, Take 1 tablet by mouth as needed., Disp: , Rfl:    promethazine (PHENERGAN) 25 MG tablet, Take 25 mg by mouth every 6 (six) hours as needed for nausea or vomiting., Disp: , Rfl:    zolpidem (AMBIEN CR) 6.25 MG CR tablet, Take 6.25 mg by mouth at bedtime. , Disp: , Rfl: 5  Orders Placed This Encounter  Procedures   CT CORONARY MORPH W/CTA COR W/SCORE W/CA W/CM &/OR WO/CM   EKG 12-Lead    There are no Patient Instructions on file for this visit.   --Continue cardiac medications as reconciled in final medication list. --Return in about 4 weeks (around 12/06/2020) for Follow up, Dyspnea. Or sooner if needed. --Continue follow-up with your primary care  physician regarding the management of your other chronic comorbid conditions.  This note was created using a voice recognition software as a result there may be grammatical errors inadvertently enclosed that do not reflect the nature of this encounter. Every attempt is made to correct such errors.  Rex Kras, [place], [facility]  Pager: [phone] Office: [phone]

## 2020-11-10 ENCOUNTER — Other Ambulatory Visit: Payer: Self-pay | Admitting: Cardiology

## 2020-11-10 DIAGNOSIS — R0602 Shortness of breath: Secondary | ICD-10-CM

## 2020-11-10 NOTE — Progress Notes (Signed)
Patient was recently seen in the office on 11/08/2020 and had labs with her PCP earlier that morning.  The office has tried calling the PCP's office to see if additional labs can be added; however, unable to receive a call back.  Therefore, I will order labs to further evaluate her shortness of breath.     ICD-10-CM   1. Shortness of breath  R06.02 CMP14+EGFR    D-dimer, quantitative    B Nat Peptide    DG Chest 2 View     Orders Placed This Encounter  Procedures   DG Chest 2 View    Standing Status:   Future    Standing Expiration Date:   11/10/2021    Order Specific Question:   Reason for Exam (SYMPTOM  OR DIAGNOSIS REQUIRED)    Answer:   shortness of breath    Order Specific Question:   Preferred imaging location?    Answer:   [facility]   CMP14+EGFR    Standing Status:   Future    Standing Expiration Date:   11/10/2021   D-dimer, quantitative    Standing Status:   Future    Standing Expiration Date:   11/10/2021   B Nat Peptide    Standing Status:   Future    Standing Expiration Date:   11/10/2021   MA reached out to the patient to get these done but had to leave a voicemail. Will reach out again.   Rex Kras, [place], [facility]  Pager: [phone] Office: [phone]

## 2020-11-25 ENCOUNTER — Ambulatory Visit (HOSPITAL_COMMUNITY)
Admission: RE | Admit: 2020-11-25 | Discharge: 2020-11-25 | Disposition: A | Discharge status: Home / Self Care | Payer: PPO | Source: Ambulatory Visit | Attending: Cardiology | Admitting: Cardiology

## 2020-11-25 ENCOUNTER — Other Ambulatory Visit: Payer: Self-pay

## 2020-11-25 ENCOUNTER — Telehealth (HOSPITAL_COMMUNITY): Payer: Self-pay | Admitting: Emergency Medicine

## 2020-11-25 DIAGNOSIS — R0602 Shortness of breath: Secondary | ICD-10-CM | POA: Insufficient documentation

## 2020-11-25 DIAGNOSIS — I509 Heart failure, unspecified: Secondary | ICD-10-CM | POA: Diagnosis not present

## 2020-11-25 NOTE — Telephone Encounter (Signed)
Attempted to call patient regarding upcoming cardiac CT appointment. Left message on voicemail with name and callback number Marchia Bond RN Navigator Cardiac Imaging [name] [phone] Office [phone] Cell  Courtesy call to remind her to get labs today or tomorrow since office might be closed Monday (labor day)

## 2020-11-26 LAB — CMP14+EGFR
ALT: 14 IU/L (ref 0–32)
AST: 21 IU/L (ref 0–40)
Albumin/Globulin Ratio: 2.3 — ABNORMAL HIGH (ref 1.2–2.2)
Albumin: 4.9 g/dL — ABNORMAL HIGH (ref 3.8–4.8)
Alkaline Phosphatase: 70 IU/L (ref 44–121)
BUN/Creatinine Ratio: 6 — ABNORMAL LOW (ref 12–28)
BUN: 7 mg/dL — ABNORMAL LOW (ref 8–27)
Bilirubin Total: 0.4 mg/dL (ref 0.0–1.2)
CO2: 27 mmol/L (ref 20–29)
Calcium: 9.8 mg/dL (ref 8.7–10.3)
Chloride: 103 mmol/L (ref 96–106)
Creatinine, Ser: 1.11 mg/dL — ABNORMAL HIGH (ref 0.57–1.00)
Globulin, Total: 2.1 g/dL (ref 1.5–4.5)
Glucose: 162 mg/dL — ABNORMAL HIGH (ref 65–99)
Potassium: 4.6 mmol/L (ref 3.5–5.2)
Sodium: 144 mmol/L (ref 134–144)
Total Protein: 7 g/dL (ref 6.0–8.5)
eGFR: 55 mL/min/{1.73_m2} — ABNORMAL LOW (ref >59)

## 2020-11-26 LAB — BRAIN NATRIURETIC PEPTIDE: BNP: 38.7 pg/mL (ref 0.0–100.0)

## 2020-11-26 LAB — D-DIMER, QUANTITATIVE: D-DIMER: 0.72 mg/L FEU — ABNORMAL HIGH (ref 0.00–0.49)

## 2020-11-27 ENCOUNTER — Emergency Department (HOSPITAL_BASED_OUTPATIENT_CLINIC_OR_DEPARTMENT_OTHER): Payer: PPO

## 2020-11-27 ENCOUNTER — Encounter (HOSPITAL_BASED_OUTPATIENT_CLINIC_OR_DEPARTMENT_OTHER): Payer: Self-pay

## 2020-11-27 ENCOUNTER — Emergency Department (HOSPITAL_BASED_OUTPATIENT_CLINIC_OR_DEPARTMENT_OTHER)
Admission: EM | Admit: 2020-11-27 | Discharge: 2020-11-27 | Disposition: A | Discharge status: Home / Self Care | Payer: PPO | Attending: Emergency Medicine | Admitting: Emergency Medicine

## 2020-11-27 ENCOUNTER — Other Ambulatory Visit: Payer: Self-pay

## 2020-11-27 DIAGNOSIS — Z79899 Other long term (current) drug therapy: Secondary | ICD-10-CM | POA: Diagnosis not present

## 2020-11-27 DIAGNOSIS — Z96652 Presence of left artificial knee joint: Secondary | ICD-10-CM | POA: Diagnosis not present

## 2020-11-27 DIAGNOSIS — R0602 Shortness of breath: Secondary | ICD-10-CM | POA: Insufficient documentation

## 2020-11-27 DIAGNOSIS — R2243 Localized swelling, mass and lump, lower limb, bilateral: Secondary | ICD-10-CM | POA: Insufficient documentation

## 2020-11-27 DIAGNOSIS — R911 Solitary pulmonary nodule: Secondary | ICD-10-CM | POA: Diagnosis not present

## 2020-11-27 DIAGNOSIS — I10 Essential (primary) hypertension: Secondary | ICD-10-CM | POA: Insufficient documentation

## 2020-11-27 DIAGNOSIS — R7989 Other specified abnormal findings of blood chemistry: Secondary | ICD-10-CM | POA: Diagnosis not present

## 2020-11-27 DIAGNOSIS — E119 Type 2 diabetes mellitus without complications: Secondary | ICD-10-CM | POA: Insufficient documentation

## 2020-11-27 DIAGNOSIS — Z87891 Personal history of nicotine dependence: Secondary | ICD-10-CM | POA: Diagnosis not present

## 2020-11-27 DIAGNOSIS — Z7902 Long term (current) use of antithrombotics/antiplatelets: Secondary | ICD-10-CM | POA: Diagnosis not present

## 2020-11-27 LAB — CBC WITH DIFFERENTIAL/PLATELET
Abs Immature Granulocytes: 0.03 10*3/uL (ref 0.00–0.07)
Basophils Absolute: 0 10*3/uL (ref 0.0–0.1)
Basophils Relative: 0 %
Eosinophils Absolute: 0.1 10*3/uL (ref 0.0–0.5)
Eosinophils Relative: 1 %
HCT: 38.5 % (ref 36.0–46.0)
Hemoglobin: 12.9 g/dL (ref 12.0–15.0)
Immature Granulocytes: 0 %
Lymphocytes Relative: 34 %
Lymphs Abs: 3.7 10*3/uL (ref 0.7–4.0)
MCH: 31.2 pg (ref 26.0–34.0)
MCHC: 33.5 g/dL (ref 30.0–36.0)
MCV: 93.2 fL (ref 80.0–100.0)
Monocytes Absolute: 0.7 10*3/uL (ref 0.1–1.0)
Monocytes Relative: 6 %
Neutro Abs: 6.5 10*3/uL (ref 1.7–7.7)
Neutrophils Relative %: 59 %
Platelets: 296 10*3/uL (ref 150–400)
RBC: 4.13 MIL/uL (ref 3.87–5.11)
RDW: 13.2 % (ref 11.5–15.5)
WBC: 11 10*3/uL — ABNORMAL HIGH (ref 4.0–10.5)
nRBC: 0 % (ref 0.0–0.2)

## 2020-11-27 LAB — COMPREHENSIVE METABOLIC PANEL
ALT: 14 U/L (ref 0–44)
AST: 17 U/L (ref 15–41)
Albumin: 4.5 g/dL (ref 3.5–5.0)
Alkaline Phosphatase: 52 U/L (ref 38–126)
Anion gap: 10 (ref 5–15)
BUN: 11 mg/dL (ref 8–23)
CO2: 26 mmol/L (ref 22–32)
Calcium: 9.6 mg/dL (ref 8.9–10.3)
Chloride: 101 mmol/L (ref 98–111)
Creatinine, Ser: 1 mg/dL (ref 0.44–1.00)
GFR, Estimated: >60 mL/min (ref >60)
Glucose, Bld: 129 mg/dL — ABNORMAL HIGH (ref 70–99)
Potassium: 3.6 mmol/L (ref 3.5–5.1)
Sodium: 137 mmol/L (ref 135–145)
Total Bilirubin: 0.6 mg/dL (ref 0.3–1.2)
Total Protein: 6.7 g/dL (ref 6.5–8.1)

## 2020-11-27 LAB — TROPONIN I (HIGH SENSITIVITY): Troponin I (High Sensitivity): 2 ng/L (ref <18)

## 2020-11-27 LAB — BRAIN NATRIURETIC PEPTIDE: B Natriuretic Peptide: 49.6 pg/mL (ref 0.0–100.0)

## 2020-11-27 MED ORDER — IOHEXOL 350 MG/ML SOLN
75.0000 mL | Freq: Once | INTRAVENOUS | Status: AC | PRN
  Administered 2020-11-27: 75 mL via INTRAVENOUS

## 2020-11-27 NOTE — ED Notes (Signed)
Pt verbalizes understanding of discharge instructions. Opportunity for questioning and answers were provided. Armand removed by staff, pt discharged from ED to home. Educated to f/u with PCP.  

## 2020-11-27 NOTE — ED Notes (Signed)
RT placed PIV in right [facility] without difficulty. PIV flushed, secured, and saline locked.

## 2020-11-27 NOTE — ED Provider Notes (Addendum)
[facility] EMERGENCY DEPT Provider Note   CSN: [id number] Arrival date & time: 11/27/20  0007     History Chief Complaint  Patient presents with   Shortness of Breath    Deborah Peters is a 65 y.o. female.  Patient was referred to the emergency department by her cardiologist.  Patient reports that she has been dealing with shortness of breath for a few months.  At one point she had swelling of her legs, her primary care doctor put her on Lasix and this improved.  She believes she is dealing with long COVID.  She was referred to a cardiologist to just recently initiated work-up.  The cardiologist called her tonight and informed her that her D-dimer was elevated and she should come to the ER to be evaluated for PE and DVT.      Past Medical History:  Diagnosis Date   Allergy    Chronic back pain    Constipation due to opioid therapy    DDD (degenerative disc disease), lumbar    Depression    Diabetes mellitus, type 2 ([place])    Fatty liver    GERD (gastroesophageal reflux disease)    past hx of    Gestational diabetes    no meds as of 06-01-2016.  diet controlled   Heart murmur    Heart murmur    Hyperlipidemia    Hypertension    pt this pain related - id occasionally high    IFG (impaired fasting glucose)    Meralgia paraesthetica    Migraines    Post-menopausal    Scoliosis    Stroke ([place])    Ulcer    years ago. gastric ulcer.    Patient Active Problem List   Diagnosis Date Noted   Acute CVA (cerebrovascular accident) ([place]) 03/17/2020   HLD (hyperlipidemia) 03/17/2020   Abdominal pain, epigastric 01/14/2018   NSAID long-term use 01/14/2018   Knee MCL sprain 10/07/2012   Right ankle sprain 10/07/2012    Past Surgical History:  Procedure Laterality Date   ABDOMINAL HYSTERECTOMY     has her ovaries   BACK SURGERY  2011   L5 S1   COLONOSCOPY     JOINT REPLACEMENT     L knee   knee surgeries     x 8 before replacement    POLYPECTOMY     HPP  in 2008 DB     OB History   No obstetric history on file.     Family History  Problem Relation Age of Onset   Cancer Maternal Grandmother    Cancer Maternal Grandfather    Colon polyps Sister    Colon polyps Brother    Stomach cancer Paternal Grandfather    Esophageal cancer Neg Hx    Rectal cancer Neg Hx    Colon cancer Neg Hx    Pancreatic cancer Neg Hx     Social History   Tobacco Use   Smoking status: Former   Smokeless tobacco: Never  Scientific laboratory technician Use: Never used  Substance Use Topics   Alcohol use: Not Currently    Comment: Rare   Drug use: No    Home Medications Prior to Admission medications   Medication Sig Start Date End Date Taking? Authorizing Provider  ascorbic acid (VITAMIN C) 500 MG tablet Take 500 mg by mouth daily.    [provider]  atorvastatin (LIPITOR) 80 MG tablet Take 1 tablet (80 mg total) by mouth at bedtime. 03/18/20  11/08/20  Nolberto Hanlon, MD  benzonatate (TESSALON) 100 MG capsule Take 100 mg by mouth 3 (three) times daily as needed for cough. 03/01/20   [provider]  clopidogrel (PLAVIX) 75 MG tablet Take 75 mg by mouth daily.    [provider]  diazepam (VALIUM) 5 MG tablet Take 5 mg by mouth every 8 (eight) hours as needed for anxiety or muscle spasms.    [provider]  DULoxetine (CYMBALTA) 60 MG capsule Take 60 mg by mouth daily.    [provider]  ezetimibe (ZETIA) 10 MG tablet Take 10 mg by mouth daily. 10/23/17   [provider]  fish oil-omega-3 fatty acids 1000 MG capsule Take 2 g by mouth 2 (two) times daily.     [provider]  furosemide (LASIX) 40 MG tablet Take 1 tablet by mouth daily.    [provider]  gabapentin (NEURONTIN) 400 MG capsule Take 400 mg by mouth in the morning, at noon, in the evening, and at bedtime. 03/12/20   [provider]  magnesium 30 MG tablet Take 30 mg by mouth 2 (two) times daily.    [provider]   metoprolol tartrate (LOPRESSOR) 25 MG tablet Take 1 tablet (25 mg total) by mouth 2 (two) times daily for 10 days. Hold if systolic blood pressure (top number) less than 100 mmHg or pulse less than 60 bpm. 11/08/20 11/18/20  Tolia, Sunit, DO  OVER THE COUNTER MEDICATION Take 1 capsule by mouth daily. Tumeric    [provider]  OVER THE COUNTER MEDICATION Take 1 tablet by mouth daily. Cinnamon daily    [provider]  OVER THE COUNTER MEDICATION Take 1 tablet by mouth at bedtime. alteril natural sleep aide at bedtime    [provider]  Oxycodone HCl 10 MG TABS Take 10 mg by mouth 5 (five) times daily. 11/23/17   [provider]  pantoprazole (PROTONIX) 40 MG tablet Take 1 tablet by mouth as needed.    [provider]  promethazine (PHENERGAN) 25 MG tablet Take 25 mg by mouth every 6 (six) hours as needed for nausea or vomiting. 03/15/20   [provider]  zolpidem (AMBIEN CR) 6.25 MG CR tablet Take 6.25 mg by mouth at bedtime.  12/20/17   [provider]    Allergies    Sulfa antibiotics  Review of Systems   Review of Systems  Respiratory:  Positive for shortness of breath.   All other systems reviewed and are negative.  Physical Exam Updated Vital Signs BP 106/75   Pulse 67   Temp 99.6 F (37.6 C) (Oral)   Resp 19   Ht '5\' 6"'$  (1.676 m)   Wt 74.8 kg   SpO2 95%   BMI 26.63 kg/m   Physical Exam Vitals and nursing note reviewed.  Constitutional:      General: She is not in acute distress.    Appearance: Normal appearance. She is well-developed.  HENT:     Head: Normocephalic and atraumatic.     Right Ear: Hearing normal.     Left Ear: Hearing normal.     Nose: Nose normal.  Eyes:     Conjunctiva/sclera: Conjunctivae normal.     Pupils: Pupils are equal, round, and reactive to light.  Cardiovascular:     Rate and Rhythm: Regular rhythm.     Heart sounds: S1 normal and S2 normal. No murmur heard.   No friction  rub. No gallop.  Pulmonary:  Effort: Pulmonary effort is normal. No respiratory distress.     Breath sounds: Normal breath sounds.  Chest:     Chest wall: No tenderness.  Abdominal:     General: Bowel sounds are normal.     Palpations: Abdomen is soft.     Tenderness: There is no abdominal tenderness. There is no guarding or rebound. Negative signs include Murphy's sign and McBurney's sign.     Hernia: No hernia is present.  Musculoskeletal:        General: Normal range of motion.     Cervical back: Normal range of motion and neck supple.     Right lower leg: No edema.     Left lower leg: No edema.  Skin:    General: Skin is warm and dry.     Findings: No rash.  Neurological:     Mental Status: She is alert and oriented to person, place, and time.     GCS: GCS eye subscore is 4. GCS verbal subscore is 5. GCS motor subscore is 6.     Cranial Nerves: No cranial nerve deficit.     Sensory: No sensory deficit.     Coordination: Coordination normal.  Psychiatric:        Speech: Speech normal.        Behavior: Behavior normal.        Thought Content: Thought content normal.    ED Results / Procedures / Treatments   Labs (all labs ordered are listed, but only abnormal results are displayed) Labs Reviewed  CBC WITH DIFFERENTIAL/PLATELET - Abnormal; Notable for the following components:      Result Value   WBC 11.0 (*)    All other components within normal limits  COMPREHENSIVE METABOLIC PANEL - Abnormal; Notable for the following components:   Glucose, Bld 129 (*)    All other components within normal limits  BRAIN NATRIURETIC PEPTIDE  TROPONIN I (HIGH SENSITIVITY)  TROPONIN I (HIGH SENSITIVITY)    EKG EKG Interpretation  Date/Time:  Saturday November 27 2020 00:35:21 EDT Ventricular Rate:  65 PR Interval:  150 QRS Duration: 97 QT Interval:  410 QTC Calculation: 427 R Axis:   83 Text Interpretation: Sinus rhythm Borderline right axis deviation Low voltage,  precordial leads Confirmed by Orpah Greek [phone]) on 11/27/2020 12:51:13 AM  Radiology DG Chest 2 View  Result Date: 11/26/2020 CLINICAL DATA:  Shortness of breath, recently diagnosed with CHF, GERD, former smoker, hypertension EXAM: CHEST - 2 VIEW COMPARISON:  08/17/2005 FINDINGS: Normal heart size, mediastinal contours, and pulmonary vascularity. Atherosclerotic calcification aorta. Lungs appear mildly hyperinflated with increased AP diameter chest question COPD. Lungs otherwise clear. No pulmonary infiltrate, pleural effusion, or pneumothorax. Bones demineralized. IMPRESSION: No acute abnormalities. Aortic Atherosclerosis (ICD10-I70.0). Electronically Signed   By: Lavonia Dana M.D.   On: 11/26/2020 12:59   CT Angio Chest Pulmonary Embolism (PE) W or WO Contrast  Result Date: 11/27/2020 CLINICAL DATA:  Positive D-dimer EXAM: CT ANGIOGRAPHY CHEST WITH CONTRAST TECHNIQUE: Multidetector CT imaging of the chest was performed using the standard protocol during bolus administration of intravenous contrast. Multiplanar CT image reconstructions and MIPs were obtained to evaluate the vascular anatomy. CONTRAST:  23m OMNIPAQUE IOHEXOL 350 MG/ML SOLN COMPARISON:  None. FINDINGS: Cardiovascular: No filling defects in the pulmonary arteries to suggest pulmonary emboli. Heart is normal size. Aorta is normal caliber. Scattered aortic calcifications. Mediastinum/Nodes: No mediastinal, hilar, or axillary adenopathy. Trachea and esophagus are unremarkable. Thyroid unremarkable. Lungs/Pleura: No confluent airspace opacities or effusions.  Small peripheral nodule in the left lower lobe measures 4 mm on image one hundred six of series 5. Nodular areas along the right minor fissure are unchanged since prior study. No effusions. Upper Abdomen: Imaging into the upper abdomen demonstrates no acute findings. Musculoskeletal: Chest wall soft tissues are unremarkable. No acute bony abnormality. Review of the MIP images  confirms the above findings. IMPRESSION: No evidence of pulmonary embolus. 4 mm left lower lobe pulmonary nodule. Stable small nodules in the right lung adjacent to the minus fissure. No follow-up needed if patient is low-risk (and has no known or suspected primary neoplasm). Non-contrast chest CT can be considered in 12 months if patient is high-risk. This recommendation follows the consensus statement: Guidelines for Management of Incidental Pulmonary Nodules Detected on CT Images: From the Fleischner Society 2017; Radiology 2017; 284:228-243. No acute cardiopulmonary disease. Aortic Atherosclerosis (ICD10-I70.0). Electronically Signed   By: Rolm Baptise M.D.   On: 11/27/2020 02:32    Procedures Procedures   Medications Ordered in ED Medications  iohexol (OMNIPAQUE) 350 MG/ML injection 75 mL (75 mLs Intravenous Contrast Given 11/27/20 0207)    ED Course  I have reviewed the triage vital signs and the nursing notes.  Pertinent labs & imaging results that were available during my care of the patient were reviewed by me and considered in my medical decision making (see chart for details).    MDM Rules/Calculators/A&P                           Patient was referred to the emergency department for evaluation of possible PE.  Patient is currently being worked up for shortness of breath that has been ongoing for months.  An outpatient D-dimer was 0.72.  She was referred to the ER for further evaluation.  Lab work is unremarkable.  CT angiography does not show any evidence of PE.  Patient did have an episode of lower extremity edema some months ago that has been treated with diuretics.  She has no lower extremity edema currently.  There is no calf tenderness, calf swelling.  I do not feel that the patient has any indication for further testing for DVT.  I did offer outpatient study but she would prefer to follow-up with her physician which I think is appropriate.  Addendum: Pulmonary nodule discussed  with patient.  She does have a smoking history.  We will have her follow-up with PCP, Dr. Haynes Kerns.  Final Clinical Impression(s) / ED Diagnoses Final diagnoses:  Shortness of breath    Rx / DC Orders ED Discharge Orders     None        Harun Brumley, Gwenyth Allegra, MD 11/27/20 0246    Orpah Greek, MD 11/27/20 [phone]

## 2020-11-27 NOTE — ED Triage Notes (Addendum)
Hx of stroke in dec 21 but has been battling sob for a few month and was scheduled for CTA but PCP called and told her her ddimer is elevated and to come in for CT  Also complains of "feeling off in her head"  X 2 days.

## 2020-11-30 ENCOUNTER — Ambulatory Visit (HOSPITAL_COMMUNITY): Payer: PPO

## 2020-12-01 ENCOUNTER — Other Ambulatory Visit: Payer: Self-pay

## 2020-12-01 DIAGNOSIS — R072 Precordial pain: Secondary | ICD-10-CM

## 2020-12-01 MED ORDER — METOPROLOL TARTRATE 25 MG PO TABS: 25.0000 mg | ORAL_TABLET | Freq: Two times a day (BID) | ORAL | 0 refills | Status: DC

## 2020-12-02 ENCOUNTER — Ambulatory Visit: Payer: PPO | Admitting: Adult Health

## 2020-12-09 ENCOUNTER — Ambulatory Visit: Payer: PPO | Admitting: Cardiology

## 2020-12-09 ENCOUNTER — Telehealth (HOSPITAL_COMMUNITY): Payer: Self-pay | Admitting: Emergency Medicine

## 2020-12-09 NOTE — Telephone Encounter (Signed)
Attempted to call patient regarding upcoming cardiac CT appointment. °Left message on voicemail with name and callback number °Pepe Mineau RN Navigator Cardiac Imaging °[facility] °[phone] Office °[phone] Cell ° °

## 2020-12-10 ENCOUNTER — Other Ambulatory Visit: Payer: Self-pay | Admitting: Physician Assistant

## 2020-12-10 ENCOUNTER — Telehealth (HOSPITAL_COMMUNITY): Payer: Self-pay | Admitting: Emergency Medicine

## 2020-12-10 ENCOUNTER — Encounter (HOSPITAL_COMMUNITY): Payer: Self-pay

## 2020-12-10 NOTE — Telephone Encounter (Signed)
Attempted to call patient regarding upcoming cardiac CT appointment. °Left message on voicemail with name and callback number °Haely Leyland RN Navigator Cardiac Imaging °[facility] °[phone] Office °[phone] Cell ° °

## 2020-12-10 NOTE — Telephone Encounter (Signed)
Reaching out to patient to offer assistance regarding upcoming cardiac imaging study; pt verbalizes understanding of appt date/time, parking situation and where to check in, pre-test NPO status and medications ordered, and verified current allergies; name and call back number provided for further questions should they arise Deborah Bond RN Navigator Cardiac Imaging [facility] [phone] office [phone] cell   Denies iv issues Denies claustro '25mg'$  metoprolol BID

## 2020-12-13 ENCOUNTER — Other Ambulatory Visit: Payer: Self-pay

## 2020-12-13 ENCOUNTER — Ambulatory Visit (HOSPITAL_COMMUNITY)
Admission: RE | Admit: 2020-12-13 | Discharge: 2020-12-13 | Disposition: A | Discharge status: Home / Self Care | Payer: PPO | Source: Ambulatory Visit | Attending: Cardiology | Admitting: Cardiology

## 2020-12-13 DIAGNOSIS — I700 Atherosclerosis of aorta: Secondary | ICD-10-CM | POA: Insufficient documentation

## 2020-12-13 DIAGNOSIS — Z006 Encounter for examination for normal comparison and control in clinical research program: Secondary | ICD-10-CM

## 2020-12-13 DIAGNOSIS — R072 Precordial pain: Secondary | ICD-10-CM | POA: Insufficient documentation

## 2020-12-13 DIAGNOSIS — I2510 Atherosclerotic heart disease of native coronary artery without angina pectoris: Secondary | ICD-10-CM | POA: Diagnosis not present

## 2020-12-13 MED ORDER — IOHEXOL 350 MG/ML SOLN
95.0000 mL | Freq: Once | INTRAVENOUS | Status: AC | PRN
  Administered 2020-12-13: 95 mL via INTRAVENOUS

## 2020-12-13 MED ORDER — NITROGLYCERIN 0.4 MG SL SUBL
0.8000 mg | SUBLINGUAL_TABLET | Freq: Once | SUBLINGUAL | Status: AC
  Administered 2020-12-13: 0.8 mg via SUBLINGUAL

## 2020-12-13 MED ORDER — NITROGLYCERIN 0.4 MG SL SUBL
SUBLINGUAL_TABLET | SUBLINGUAL | Status: AC
  Filled 2020-12-13: qty 2

## 2020-12-13 NOTE — Research (Addendum)
IDENTIFY Informed Consent                  Subject Name:Deborah Peters   Subject met inclusion and exclusion criteria.  The informed consent form, study requirements and expectations were reviewed with the subject and questions and concerns were addressed prior to the signing of the consent form.  The subject verbalized understanding of the trial requirements.  The subject agreed to participate in the IDENTIFY trial and signed the informed consent at 0724 on 12/13/20.  The informed consent was obtained prior to performance of any protocol-specific procedures for the subject.  A copy of the signed informed consent was given to the subject and a copy was placed in the subject's medical record.   Chanda Busing, Research Coordinator

## 2020-12-16 ENCOUNTER — Ambulatory Visit: Payer: PPO | Admitting: Adult Health

## 2020-12-21 ENCOUNTER — Ambulatory Visit: Payer: PPO | Admitting: Cardiology

## 2020-12-23 NOTE — Progress Notes (Signed)
See results note above I tried contacting patient no answer. Per ST patient needs to be aware of results

## 2020-12-23 NOTE — Progress Notes (Signed)
No answer left a vm

## 2020-12-24 DIAGNOSIS — E119 Type 2 diabetes mellitus without complications: Secondary | ICD-10-CM | POA: Diagnosis not present

## 2020-12-24 DIAGNOSIS — E785 Hyperlipidemia, unspecified: Secondary | ICD-10-CM | POA: Diagnosis not present

## 2020-12-24 DIAGNOSIS — I10 Essential (primary) hypertension: Secondary | ICD-10-CM | POA: Diagnosis not present

## 2020-12-28 ENCOUNTER — Ambulatory Visit (HOSPITAL_BASED_OUTPATIENT_CLINIC_OR_DEPARTMENT_OTHER): Payer: PPO | Admitting: Cardiology

## 2021-01-04 NOTE — Progress Notes (Signed)
Called patient, Deborah Peters, LMAM

## 2021-01-05 ENCOUNTER — Telehealth: Payer: Self-pay | Admitting: Adult Health

## 2021-01-05 ENCOUNTER — Ambulatory Visit: Payer: PPO | Admitting: Adult Health

## 2021-01-05 NOTE — Telephone Encounter (Signed)
FYI- pt's husband called to cancel wife's appt. She is not feeling well today.

## 2021-01-05 NOTE — Telephone Encounter (Signed)
Noted; she has rescheduled for next week.

## 2021-01-05 NOTE — Progress Notes (Signed)
2nd attempt : Called patient, NA, LMAM

## 2021-01-06 NOTE — Progress Notes (Signed)
CT CORONARY MORPH W/CTA COR W/SCORE W/CA W/CM &/OR WO/CM Please see note from [place] above. Unable to reach patient at this time.

## 2021-01-11 ENCOUNTER — Encounter: Payer: Self-pay | Admitting: Adult Health

## 2021-01-11 ENCOUNTER — Ambulatory Visit: Payer: PPO | Admitting: Cardiology

## 2021-01-11 ENCOUNTER — Telehealth (INDEPENDENT_AMBULATORY_CARE_PROVIDER_SITE_OTHER): Payer: PPO | Admitting: Adult Health

## 2021-01-11 DIAGNOSIS — R252 Cramp and spasm: Secondary | ICD-10-CM

## 2021-01-11 DIAGNOSIS — I6381 Other cerebral infarction due to occlusion or stenosis of small artery: Secondary | ICD-10-CM | POA: Diagnosis not present

## 2021-01-11 DIAGNOSIS — I69398 Other sequelae of cerebral infarction: Secondary | ICD-10-CM | POA: Diagnosis not present

## 2021-01-11 DIAGNOSIS — R55 Syncope and collapse: Secondary | ICD-10-CM

## 2021-01-11 DIAGNOSIS — M79601 Pain in right arm: Secondary | ICD-10-CM | POA: Diagnosis not present

## 2021-01-11 NOTE — Progress Notes (Signed)
[facility] [address] [place]. [place] [address] [phone]       STROKE FOLLOW UP NOTE  [name] Date of Birth:  07/15/1955 Medical Record Number:  [id number]   Reason for Referral: stroke follow up  Virtual Visit via Video Note  Virtual visit completed through [place], a video enabled telemedicine application. Due to national recommendations of social distancing due to COVID-19, a virtual visit is felt to be most appropriate for this patient at this time. Reviewed limitations, risks, security and privacy concerns of performing a virtual visit and the availability of in person appointments. I also reviewed that there may be a patient responsible charge related to this service. The patient agreed to proceed.    Patient location: home Provider location: in office, [facility] Persons participating in this virtual visit: patient, provider    SUBJECTIVE:   HPI:   Update 01/11/2021 JM: Returns for overdue stroke follow-up via MyChart video visit after prior visit approximately 8 months ago. She does report right hand, arm and shoulder pain and tightness which has been slowly progressive - can be intermittent or occur in clusters - can be present over a week and other times wont have any pain. Greater difficulty functioning with right hand during increased pain especially as hx of fracture to right pointer finger and 4th digit trigger finger. She questions possible benefit with therapy.  Otherwise, stable from stroke standpoint without new stroke/TIA symptoms.  Reports compliance on Plavix and atorvastatin without side effects.  Routinely follows with PCP.  Also reports syncopal episodes which initially started back in April and then subsided and restarted around July/August. Of note, she was started on furosemide back in April for lower extremity swelling.  She has remained on furosemide 40 mg daily.  Reports syncopal event last week and  prior to that 7 to 10 days ago.  Episodes usually occur when going from sitting to standing or when walking which leads to falls and has hit her head on 2 of her falls but did not experience any type of headache or neurological symptoms. At times, may not have much of a warning but other times will experience lightheadedness/dizziness sensation, generally not feeling well and physically not stable.  At times she can feel the symptoms which do not progress to passing out.  Fluctuation of blood pressure readings with [place] weeks average 110/60 and on 10/16, she was not feeling well and felt dizzy with worsening LE edema -blood pressure at that time 98/56.  She does endorse occasional low readings with SBP 90 or less. She is unable to state how long she is out for but once she comes to, denies any type of confusion, fatigue, nausea/vomiting or bladder/bowel incontinence.  She was seen by cardiology 8/15 for evaluation of dyspnea and concern of CHF -discussed syncopal events who felt possibly in setting of polypharmacy (Valium, duloxetine, Lasix, gabapentin, oxycodone) and further eval for syncope not recommended at that time such as with heart monitor as episodes infrequent at that time.  She did have scheduled follow-up today with cardiology but canceled as provider not in office.     History provided for reference purposes only Initial visit 05/17/2020 JM: Deborah Peters is being seen for hospital follow-up accompanied by her husband.   Reports residual right arm and hand numbness/tingling which has been gradually improving.  She also reports mild cognitive difficulties which have been present prior to her stroke and slightly worsened with recent stroke She did  have episode of left sided numbness which quickly resolved - evaluated in ED by neurology who felt symptom pattern not typical for acute stroke - no additional symptoms since that time Denies new stroke/TIA symptoms  Completed 3 months DAPT and remains on  Plavix alone without bleeding or bruising Remains on atorvastatin 80 mg daily without myalgias Blood pressure today 116/67 Continued tobacco use with plans on quitting in the near future  She continues to follow neurosurgery for chronic lower back pain   Stroke admission 03/17/2020 Deborah Peters is a 65 y.o. female with history of diabetes, hypertension, hyperlipidemia, meralgia paresthetica, heart murmur, and migraines who presented to [facility] ED on 03/17/2020 with 4d hx of R face, arm and leg numbness with difficulty walking.   Personally reviewed hospitalization pertinent progress notes, lab work and imaging with summary provided.  Evaluated by Dr. 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  Recommended DAPT for 3 weeks and Plavix alone as previously on aspirin.  History of HTN stable.  History of HLD on pravastatin 20, Zetia 10 and fish oil -direct LDL 95 with LDL UTC d/t TG 467.  Recommended transitioning to atorvastatin 80 mg daily but per hospital notes, patient tried multiple statins previously with PCP Dr. Abner Greenspan and chose to continue on pravastatin at discharge.  Controlled DM with A1c 5.7.  Other stroke risk factors include prior strokes on imaging, current tobacco use, history of EtOH use and migraines.    Evaluated by therapies and discharged home in stable condition without therapy needs.  Stroke:   L thalamic infarct secondary to small vessel disease   MRI  Subacute L thalamocapsular infarct. Remote L basal ganglia lacune. Small vessel disease.  MRA  81mm L paraopthalmic artery aneurysm. Mild atherosclerosis  Carotid Doppler bilateral no carotid stenosis 2D Echo  EF 60 to 65%.  No cardiac source of embolism. LDL UTC d/t TG 467, direct LDL 95.1  HgbA1c 5.7 aspirin 81 mg daily prior to admission, now on aspirin 325 mg daily. Decrease aspirin to 81 and add plavix 75. Continue DAPT x 3 weeks then plavix alone.  Therapy recommendations:    No OT or PT follow-up  Disposition: Home     ROS:   14 system review of systems performed and negative with exception of those listed in HPI  PMH:  Past Medical History:  Diagnosis Date   Allergy    Chronic back pain    Constipation due to opioid therapy    DDD (degenerative disc disease), lumbar    Depression    Diabetes mellitus, type 2 ([place])    Fatty liver    GERD (gastroesophageal reflux disease)    past hx of    Gestational diabetes    no meds as of 06-01-2016.  diet controlled   Heart murmur    Heart murmur    Hyperlipidemia    Hypertension    pt this pain related - id occasionally high    IFG (impaired fasting glucose)    Meralgia paraesthetica    Migraines    Post-menopausal    Scoliosis    Stroke ([place])    Ulcer    years ago. gastric ulcer.    PSH:  Past Surgical History:  Procedure Laterality Date   ABDOMINAL HYSTERECTOMY     has her ovaries   BACK SURGERY  2011   L5 S1   COLONOSCOPY     JOINT REPLACEMENT     L knee  knee surgeries     x 8 before replacement    POLYPECTOMY     HPP in 2008 DB    Social History:  Social History   Socioeconomic History   Marital status: Married    Spouse name: Not on file   Number of children: 2   Years of education: Not on file   Highest education level: Not on file  Occupational History   Occupation: disabled  Tobacco Use   Smoking status: Former   Smokeless tobacco: Never  Scientific laboratory technician Use: Never used  Substance and Sexual Activity   Alcohol use: Not Currently    Comment: Rare   Drug use: No   Sexual activity: Not on file  Other Topics Concern   Not on file  Social History Narrative   Not on file   Social Determinants of Health   Financial Resource Strain: Not on file  Food Insecurity: Not on file  Transportation Needs: Not on file  Physical Activity: Not on file  Stress: Not on file  Social Connections: Not on file  Intimate Partner Violence: Not on file    Family History:   Family History  Problem Relation Age of Onset   Cancer Maternal Grandmother    Cancer Maternal Grandfather    Colon polyps Sister    Colon polyps Brother    Stomach cancer Paternal Grandfather    Esophageal cancer Neg Hx    Rectal cancer Neg Hx    Colon cancer Neg Hx    Pancreatic cancer Neg Hx     Medications:   Current Outpatient Medications on File Prior to Visit  Medication Sig Dispense Refill   ascorbic acid (VITAMIN C) 500 MG tablet Take 500 mg by mouth daily.     atorvastatin (LIPITOR) 80 MG tablet Take 1 tablet (80 mg total) by mouth at bedtime. 30 tablet 1   benzonatate (TESSALON) 100 MG capsule Take 100 mg by mouth 3 (three) times daily as needed for cough.     clopidogrel (PLAVIX) 75 MG tablet Take 75 mg by mouth daily.     diazepam (VALIUM) 5 MG tablet Take 5 mg by mouth every 8 (eight) hours as needed for anxiety or muscle spasms.     DULoxetine (CYMBALTA) 60 MG capsule Take 60 mg by mouth daily.     ezetimibe (ZETIA) 10 MG tablet Take 10 mg by mouth daily.  3   fish oil-omega-3 fatty acids 1000 MG capsule Take 2 g by mouth 2 (two) times daily.      furosemide (LASIX) 40 MG tablet Take 1 tablet by mouth daily.     gabapentin (NEURONTIN) 400 MG capsule Take 400 mg by mouth in the morning, at noon, in the evening, and at bedtime.     magnesium 30 MG tablet Take 30 mg by mouth 2 (two) times daily.     metoprolol tartrate (LOPRESSOR) 25 MG tablet Take 1 tablet (25 mg total) by mouth 2 (two) times daily for 10 days. Hold if systolic blood pressure (top number) less than 100 mmHg or pulse less than 60 bpm. 20 tablet 0   OVER THE COUNTER MEDICATION Take 1 capsule by mouth daily. Tumeric     OVER THE COUNTER MEDICATION Take 1 tablet by mouth daily. Cinnamon daily     OVER THE COUNTER MEDICATION Take 1 tablet by mouth at bedtime. alteril natural sleep aide at bedtime     Oxycodone HCl 10 MG TABS Take 10 mg by  mouth 5 (five) times daily.  0   pantoprazole (PROTONIX) 40 MG tablet  Take 1 tablet by mouth as needed.     promethazine (PHENERGAN) 25 MG tablet Take 25 mg by mouth every 6 (six) hours as needed for nausea or vomiting.     zolpidem (AMBIEN CR) 6.25 MG CR tablet Take 6.25 mg by mouth at bedtime.   5   No current facility-administered medications on file prior to visit.    Allergies:   Allergies  Allergen Reactions   Sulfa Antibiotics Anaphylaxis      OBJECTIVE:  Physical Exam Limited due to visit type General: well developed, well nourished,  pleasant middle-age Caucasian female, seated, in no evident distress  Neurologic Exam Mental Status: Awake and fully alert.   Fluent speech and language.  Oriented to place and time. Recent memory subjectively impaired and remote memory intact. Attention span, concentration and fund of knowledge appropriate. Mood and affect appropriate.  Motor: No evidence of pronator drift Coordination: Rapid alternating movements normal in all extremities. Finger-to-nose and heel-to-shin performed accurately bilaterally.       ASSESSMENT: Deborah Peters is a 65 y.o. 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  Vascular risk factors include HTN, HLD, DM, prior stroke on imaging, current tobacco use, history of EtOH use and migraines. Onset of presyncope/syncopal events started around April     PLAN:  Left thalamic stroke:  Residual deficit: RUE intermittent spasticity -referral placed to neuro rehab OT.  Due to being on multiple medications and syncopal issues, would not recommend adding new medication at this time. If not improvement with therapy, may consider referral for Botox Continue clopidogrel 75 mg daily  and atorvastatin 80 mg daily, Zetia 10 mg daily and fish oil for secondary stroke prevention.   Discussed secondary stroke prevention measures and importance of close PCP  follow up for aggressive stroke risk factor management  Presyncope/syncope: based on reported symptoms, appears to be more cardiac related especially as present since initiating furosemide possibly low blood pressure or cardiac arrhythmia - advised to contact cardiology to further discuss further evaluation HTN: BP goal <130/90.  Fluctuating although on lower side.  Advised to continue monitoring and to follow-up with PCP/cardiology to further discuss HLD: LDL goal <70.  Continue atorvastatin 80 mg daily per PCP DMII: A1c goal<7.0.  Diet controlled monitored by PCP Tobacco use: Discussed importance of complete tobacco cessation.     Follow up in 6 months or call earlier if needed  CC:  [facility] provider: Dr. Mont Dutton, MD   Dr. Terri Skains (cardiology)   I spent 38 minutes of face-to-face and non-face-to-face time with patient via [place] video visit.  This included previsit chart review, lab review, study review, order entry, electronic health record documentation, patient education regarding prior stroke including etiology, residual deficits and onset of spasticity, importance of managing stroke risk factors and secondary stroke prevention measures, onset of syncopal events and possible etiologies and answered all other questions to patients satisfaction   Frann Rider, AGNP-BC  [facility] [address] [place], [place] [address]  Phone [phone] Fax [phone] Note: This document was prepared with digital dictation and possible smart phrase technology. Any transcriptional errors that result from this process are unintentional.

## 2021-01-11 NOTE — Patient Instructions (Signed)
Spasticity Spasticity is a condition in which your muscles contract suddenly and unpredictably (spasm). Spasticity usually affects your arms, legs, or back. It can also affect the way you walk. Spasticity can range from mild muscle stiffness and tightness to severe, uncontrollable muscle spasms. Severe spasticity can be painful and can freeze your muscles in an uncomfortable position. Follow these instructions at home: Managing muscle stiffness and spasms   Wear a brace as told by your health care provider to prevent muscle contractions. Have the affected muscles massaged. If directed, apply heat to the affected muscle area. Use the heat source that your health care provider recommends, such as a moist heat pack or heating pad. Place a towel between your skin and the heat source. Leave the heat on for 20-30 minutes. Remove the head if your skin turns bright red. This is especially important if you are unable to feel pain, heat, or cold. You may have a greater risk of getting burned. If directed, apply ice to the affected muscle area: Put ice in a plastic bag. Place a towel between your skin and the bag or between your brace and the bag. Leave the ice on for 20 minutes, 2?3 times a day. Activity Stay active as directed by your health care provider. Find a safe exercise program that fits your needs and ability. Maintain good posture when walking and sitting. Work with a physical therapist to learn exercises that will stretch and strengthen your muscles. Do stretching and range of motion exercises at home as told by a physical therapist. Work with an occupational therapist. This type of health care provider can help you function better at home and at work. If you have severe spasticity, use mobility aids, such as a walker or cane, as told by your health care provider. General instructions Watch your condition for any changes. Wear loose, comfortable clothing that does not restrict your  movement. Wear closed-toe shoes that fit well and support your feet. Wear shoes that have rubber soles or low heels. Keep all follow-up visits as told by your health care provider. This is important. Take over-the-counter and prescription medicine only as told by your health care provider. Contact a health care provider if you: Have worsening muscle spasms. Develop other symptoms along with spasticity. Have a fever or chills. Experience a burning feeling when you pass urine. Become constipated. Need more support at home. Get help right away if you: Have trouble breathing. Have a muscle spasm that freezes you into a painful position. Cannot walk. Cannot care for yourself at home. Have trouble passing urine or have urinary incontinence. Summary Spasticity is a condition in which your muscles contract suddenly and unpredictably (spasm). Spasticity usually affects your arms, legs, or back. Spasticity can range from mild muscle stiffness and tightness to severe, uncontrollable muscle spasms. Do stretching and range of motion exercises at home as told by a physical therapist. Take over-the-counter and prescription medicine only as told by your health care provider. This information is not intended to replace advice given to you by your health care provider. Make sure you discuss any questions you have with your health care provider. Document Revised: 04/10/2017 Document Reviewed: 04/10/2017 Elsevier Patient Education  2022 [employer].

## 2021-01-13 NOTE — Progress Notes (Signed)
I agree with the above plan 

## 2021-01-25 DIAGNOSIS — I5033 Acute on chronic diastolic (congestive) heart failure: Secondary | ICD-10-CM | POA: Diagnosis not present

## 2021-01-25 DIAGNOSIS — E785 Hyperlipidemia, unspecified: Secondary | ICD-10-CM | POA: Diagnosis not present

## 2021-01-25 DIAGNOSIS — M545 Low back pain, unspecified: Secondary | ICD-10-CM | POA: Diagnosis not present

## 2021-01-25 DIAGNOSIS — I639 Cerebral infarction, unspecified: Secondary | ICD-10-CM | POA: Diagnosis not present

## 2021-01-25 DIAGNOSIS — R110 Nausea: Secondary | ICD-10-CM | POA: Diagnosis not present

## 2021-01-25 DIAGNOSIS — G8929 Other chronic pain: Secondary | ICD-10-CM | POA: Diagnosis not present

## 2021-01-25 DIAGNOSIS — R000 Tachycardia, unspecified: Secondary | ICD-10-CM | POA: Diagnosis not present

## 2021-01-25 DIAGNOSIS — I10 Essential (primary) hypertension: Secondary | ICD-10-CM | POA: Diagnosis not present

## 2021-01-26 NOTE — Progress Notes (Deleted)
Date:  11/08/2020   ID:  Loraine Leriche, DOB 1955-10-02, MRN [id number]  PCP:  Crist Infante, MD  Cardiologist:  Rex Kras, DO, [facility] (established care 11/08/2020)  REASON FOR CONSULT: Dyspnea  REQUESTING PHYSICIAN:  Crist Infante, MD [address] [place],  [place] [address]  Chief Complaint  Patient presents with   Shortness of Breath   New Patient (Initial Visit)    Referred by Claud Kelp    HPI  Deborah Peters is a 65 y.o. female who presents to the office with a chief complaint of " shortness of breath." Patient's past medical history and cardiovascular risk factors include: Hx of COVID (March 2020), Mild coronary artery calcification, hyperlipidemia, history of stroke (03/10/2020), aortic atherosclerosis, postmenopausal female, advanced age, former smoker.  She is referred to the office at the request of Crist Infante, MD for evaluation of dyspnea.  After plane ride in April 2022 she has been experiencing lower extremity swelling.  She initially noticed a 15 pound weight gain over the course of 6 days and went to her PCP and was initiated on Lasix.  The dose of Lasix was later uptitrated to 40 mg p.o. daily which resulted in 6-7 pounds of weight loss.  She now presents to the office for evaluation of congestive heart failure.  Patient states that recently she has gained weight and notes lower extremity swelling.  She denies orthopnea or paroxysmal nocturnal dyspnea.  She had an echocardiogram earlier this morning which notes preserved LVEF with normal diastolic pattern and normal left atrial pressure.  She also had blood work at Dr. Elta Guadeloupe Perini's office earlier this morning results pending.  Review of systems positive for chest tightness.  Described as a bandlike distribution over both breasts, intensity 4 out of 10, lasting for a few minutes, occurs once or twice a week, nonradiating, not brought on by effort related activities, usually self-limited.  She also complains of  symptoms of near syncope/syncope.  Last episode was prior to October 26, 2020.  She is on pharmacological agents that could be contributing which include Valium, Cymbalta, Lasix, oxycodone, delta 8 over-the-counter supplement.  Patient does have a history of stroke as described above.  And family history of CAD with father undergoing CABG at the age of 52.  FUNCTIONAL STATUS: No structured exercise program or daily routine.  ALLERGIES: Allergies  Allergen Reactions   Sulfa Antibiotics Anaphylaxis    MEDICATION LIST PRIOR TO VISIT: Current Meds  Medication Sig   ascorbic acid (VITAMIN C) 500 MG tablet Take 500 mg by mouth daily.   atorvastatin (LIPITOR) 80 MG tablet Take 1 tablet (80 mg total) by mouth at bedtime.   benzonatate (TESSALON) 100 MG capsule Take 100 mg by mouth 3 (three) times daily as needed for cough.   clopidogrel (PLAVIX) 75 MG tablet Take 75 mg by mouth daily.   diazepam (VALIUM) 5 MG tablet Take 5 mg by mouth every 8 (eight) hours as needed for anxiety or muscle spasms.   DULoxetine (CYMBALTA) 60 MG capsule Take 60 mg by mouth daily.   ezetimibe (ZETIA) 10 MG tablet Take 10 mg by mouth daily.   fish oil-omega-3 fatty acids 1000 MG capsule Take 2 g by mouth 2 (two) times daily.    furosemide (LASIX) 40 MG tablet Take 1 tablet by mouth daily.   gabapentin (NEURONTIN) 400 MG capsule Take 400 mg by mouth in the morning, at noon, in the evening, and at bedtime.   magnesium 30 MG tablet  Take 30 mg by mouth 2 (two) times daily.   metoprolol tartrate (LOPRESSOR) 25 MG tablet Take 1 tablet (25 mg total) by mouth 2 (two) times daily for 10 days. Hold if systolic blood pressure (top number) less than 100 mmHg or pulse less than 60 bpm.   OVER THE COUNTER MEDICATION Take 1 capsule by mouth daily. Tumeric   OVER THE COUNTER MEDICATION Take 1 tablet by mouth daily. Cinnamon daily   OVER THE COUNTER MEDICATION Take 1 tablet by mouth at bedtime. alteril natural sleep aide at bedtime    Oxycodone HCl 10 MG TABS Take 10 mg by mouth 5 (five) times daily.   pantoprazole (PROTONIX) 40 MG tablet Take 1 tablet by mouth as needed.   promethazine (PHENERGAN) 25 MG tablet Take 25 mg by mouth every 6 (six) hours as needed for nausea or vomiting.   zolpidem (AMBIEN CR) 6.25 MG CR tablet Take 6.25 mg by mouth at bedtime.      PAST MEDICAL HISTORY: Past Medical History:  Diagnosis Date   Allergy    Chronic back pain    Constipation due to opioid therapy    DDD (degenerative disc disease), lumbar    Depression    Diabetes mellitus, type 2 ([place])    Fatty liver    GERD (gastroesophageal reflux disease)    past hx of    Gestational diabetes    no meds as of 06-01-2016.  diet controlled   Heart murmur    Heart murmur    Hyperlipidemia    Hypertension    pt this pain related - id occasionally high    IFG (impaired fasting glucose)    Meralgia paraesthetica    Migraines    Post-menopausal    Scoliosis    Stroke ([place])    Ulcer    years ago. gastric ulcer.    PAST SURGICAL HISTORY: Past Surgical History:  Procedure Laterality Date   ABDOMINAL HYSTERECTOMY     has her ovaries   BACK SURGERY  2011   L5 S1   COLONOSCOPY     JOINT REPLACEMENT     L knee   knee surgeries     x 8 before replacement    POLYPECTOMY     HPP in 2008 DB    FAMILY HISTORY: The patient family history includes Cancer in her maternal grandfather and maternal grandmother; Colon polyps in her brother and sister; Stomach cancer in her paternal grandfather.  SOCIAL HISTORY:  The patient  reports that she has quit smoking. She has never used smokeless tobacco. She reports that she does not currently use alcohol. She reports that she does not use drugs.  REVIEW OF SYSTEMS: Review of Systems  Constitutional: Positive for weight gain. Negative for chills and fever.  HENT:  Negative for hoarse voice and nosebleeds.   Eyes:  Negative for discharge, double vision and pain.  Cardiovascular:  Positive for  chest pain (see above), dyspnea on exertion and leg swelling. Negative for claudication, near-syncope, orthopnea, palpitations, paroxysmal nocturnal dyspnea and syncope.  Respiratory:  Negative for hemoptysis and shortness of breath.   Musculoskeletal:  Negative for muscle cramps and myalgias.  Gastrointestinal:  Negative for abdominal pain, constipation, diarrhea, hematemesis, hematochezia, melena, nausea and vomiting.  Neurological:  Negative for dizziness and light-headedness.   PHYSICAL EXAM: Vitals with BMI 11/08/2020 05/17/2020 03/21/2020  Height 5\' 6"  5\' 6"  -  Weight 173 lbs 3 oz 153 lbs -  BMI 09.47 09.62 -  Systolic 836 629 -  Diastolic 47 67 -  Pulse 85 90 79   Orthostatic VS for the past 72 hrs (Last 3 readings):  Orthostatic BP Patient Position BP Location Cuff Size Orthostatic Pulse  11/08/20 1308 112/60 Standing Left Arm Large 87  11/08/20 1307 -- Sitting Left Arm Large --  11/08/20 1305 114/45 Supine Left Arm Large 83     CONSTITUTIONAL: Well-developed and well-nourished. No acute distress.  SKIN: Skin is warm and dry. No rash noted. No cyanosis. No pallor. No jaundice HEAD: Normocephalic and atraumatic.  EYES: No scleral icterus MOUTH/THROAT: Moist oral membranes.  NECK: No JVP present. No thyromegaly noted. No carotid bruits  LYMPHATIC: No visible cervical adenopathy.  CHEST Normal respiratory effort. No intercostal retractions  LUNGS: Clear to auscultation bilaterally.  No stridor. No wheezes. No rales.  CARDIOVASCULAR: Regular, positive S1-S2, no murmurs rubs or gallops appreciated. ABDOMINAL: Soft, nontender, nondistended, positive bowel sounds in all 4 quadrants, no apparent ascites.  EXTREMITIES: No peripheral edema, warm to touch, 2+ DP and PT pulses bilaterally. HEMATOLOGIC: No significant bruising NEUROLOGIC: Oriented to person, place, and time. Nonfocal. Normal muscle tone.  PSYCHIATRIC: Normal mood and affect. Normal behavior. Cooperative  CARDIAC  DATABASE: EKG: 11/08/2020: Normal sinus rhythm, 84 bpm, normal axis, without underlying ischemia or injury pattern.  Echocardiogram: 11/08/2020:  1. Left ventricular ejection fraction, by estimation, is 65 to 70%. The left ventricle has normal function. There is mild left ventricular hypertrophy. Left ventricular diastolic parameters were normal.   2. Right ventricular systolic function is normal. The right ventricular size is normal.   3. The mitral valve is normal in structure. Trivial mitral valve regurgitation.   4. The aortic valve is normal in structure. Aortic valve regurgitation is not visualized.    Stress Testing: No results found for this or any previous visit from the past 1095 days.   Heart Catheterization: None  RADIOLOGY: MRI brain without contrast: 03/16/2020: 1. 1 cm acute to early subacute ischemic left thalamocapsular infarct. No associated hemorrhage or mass effect. 2. Additional remote lacunar infarct involving the adjacent left basal ganglia. 3. Underlying mild chronic microvascular ischemic disease.  MRA head without contrast: 03/17/2020 1. 3 mm left paraophthalmic artery aneurysm. 2. Mild atherosclerotic irregularity within the cavernous internal carotid arteries without significant stenosis. 3. Otherwise normal MRA Circle of Willis.  LABORATORY DATA: External labs provided by PCP. 10/18/2020: BUN 6, creatinine 0.8, sodium 138, potassium 4.7, chloride 103, bicarb 25 Hemoglobin 12.2 g/dL, hematocrit 34.6% NT proBNP 47  10/01/2020:  Total cholesterol 112, triglycerides 156, HDL 35, LDL 46, non-HDL 77. Apolipoprotein B 69 (within normal limits) TSH 1.69 A1c 6.4  IMPRESSION:    ICD-10-CM   1. Shortness of breath  R06.02 EKG 12-Lead    2. Precordial pain  R07.2 CT CORONARY MORPH W/CTA COR W/SCORE W/CA W/CM &/OR WO/CM    metoprolol tartrate (LOPRESSOR) 25 MG tablet    3. Coronary artery calcification of native artery  I25.10    I25.84     4.  Atherosclerosis of aorta (HCC)  I70.0     5. History of stroke  Z86.73     6. Former smoker  Z87.891        RECOMMENDATIONS: Deborah Peters is a 65 y.o. female whose past medical history and cardiac risk factors include: Hx of COVID (March 2020), Mild coronary artery calcification, hyperlipidemia, history of stroke (03/10/2020), aortic atherosclerosis, postmenopausal female, advanced age, former smoker.  Shortness of breath: Patient is referred to the office for evaluation of congestive  heart failure.  Clinically my suspicion for CHF is low as the patient does not experience orthopnea, paroxysmal nocturnal dyspnea.  She does complain of lower extremity swelling currently; however, on physical examination no swelling present.  No JVP on examination either.  She has not had an echocardiogram earlier this morning which notes preserved LVEF and normal diastolic pattern and no evidence of elevated left atrial pressure.  NT proBNP which was checked prior to initiating diuretic therapy was within normal limits.  Patient also had labs with her PCP earlier this morning which are still pending.  Would like to also check his BNP.  Her symptoms started back in April 2022 after taking a flight ([place] to [place] to [facility]).  Though suspicion for pulmonary embolism is low would recommend checking a D-dimer.  Our office tried to contact her PCP to see if these lab can be ordered awaiting response.  Given the results of the D-dimer we will consider either checking a CT study or a VQ scan.  Precordial chest pain: Patient symptoms of chest discomfort appear to be nonspecific. She had a coronary calcium score which was mild back in January 2022. Patient states that she is unable to exercise on a treadmill and therefore will proceed with coronary CTA to evaluate for obstructive CAD.  History of stroke: Currently on aspirin, Plavix, statin therapy, Zetia.  Patient complained that she has had episodes of syncope during which  she may pass out up to 15 minutes.  However on further questioning patient's husband states that she may be just falling asleep.  She is on multiple medications that can predispose her to being drowsy and/or soft blood pressures such as Valium, duloxetine, Lasix, gabapentin, oxycodone.  I have asked her to space these medications throughout the day.  Since she has not had any episodes since October 26, 2020 the shared decision was to hold off on proceeding with a monitor as it would be low yield given how infrequent the episodes have become.  As a part of this consultation reviewed outside records provided by the referring provider which included but not limited to office notes, labs, and prior to testing diagnostic testing results via epic were also reviewed.  These were taken into account with regards to medical decision making and pertinent findings were independently summarized and noted above for reference.  Plan of care discussed with both the patient and her husband at today's office visit and both are in agreement.  FINAL MEDICATION LIST END OF ENCOUNTER: Meds ordered this encounter  Medications   metoprolol tartrate (LOPRESSOR) 25 MG tablet    Sig: Take 1 tablet (25 mg total) by mouth 2 (two) times daily for 10 days. Hold if systolic blood pressure (top number) less than 100 mmHg or pulse less than 60 bpm.    Dispense:  20 tablet    Refill:  0     There are no discontinued medications.   Current Outpatient Medications:    ascorbic acid (VITAMIN C) 500 MG tablet, Take 500 mg by mouth daily., Disp: , Rfl:    atorvastatin (LIPITOR) 80 MG tablet, Take 1 tablet (80 mg total) by mouth at bedtime., Disp: 30 tablet, Rfl: 1   benzonatate (TESSALON) 100 MG capsule, Take 100 mg by mouth 3 (three) times daily as needed for cough., Disp: , Rfl:    clopidogrel (PLAVIX) 75 MG tablet, Take 75 mg by mouth daily., Disp: , Rfl:    diazepam (VALIUM) 5 MG tablet, Take 5 mg by  mouth every 8 (eight) hours as  needed for anxiety or muscle spasms., Disp: , Rfl:    DULoxetine (CYMBALTA) 60 MG capsule, Take 60 mg by mouth daily., Disp: , Rfl:    ezetimibe (ZETIA) 10 MG tablet, Take 10 mg by mouth daily., Disp: , Rfl: 3   fish oil-omega-3 fatty acids 1000 MG capsule, Take 2 g by mouth 2 (two) times daily. , Disp: , Rfl:    furosemide (LASIX) 40 MG tablet, Take 1 tablet by mouth daily., Disp: , Rfl:    gabapentin (NEURONTIN) 400 MG capsule, Take 400 mg by mouth in the morning, at noon, in the evening, and at bedtime., Disp: , Rfl:    magnesium 30 MG tablet, Take 30 mg by mouth 2 (two) times daily., Disp: , Rfl:    metoprolol tartrate (LOPRESSOR) 25 MG tablet, Take 1 tablet (25 mg total) by mouth 2 (two) times daily for 10 days. Hold if systolic blood pressure (top number) less than 100 mmHg or pulse less than 60 bpm., Disp: 20 tablet, Rfl: 0   OVER THE COUNTER MEDICATION, Take 1 capsule by mouth daily. Tumeric, Disp: , Rfl:    OVER THE COUNTER MEDICATION, Take 1 tablet by mouth daily. Cinnamon daily, Disp: , Rfl:    OVER THE COUNTER MEDICATION, Take 1 tablet by mouth at bedtime. alteril natural sleep aide at bedtime, Disp: , Rfl:    Oxycodone HCl 10 MG TABS, Take 10 mg by mouth 5 (five) times daily., Disp: , Rfl: 0   pantoprazole (PROTONIX) 40 MG tablet, Take 1 tablet by mouth as needed., Disp: , Rfl:    promethazine (PHENERGAN) 25 MG tablet, Take 25 mg by mouth every 6 (six) hours as needed for nausea or vomiting., Disp: , Rfl:    zolpidem (AMBIEN CR) 6.25 MG CR tablet, Take 6.25 mg by mouth at bedtime. , Disp: , Rfl: 5  Orders Placed This Encounter  Procedures   CT CORONARY MORPH W/CTA COR W/SCORE W/CA W/CM &/OR WO/CM   EKG 12-Lead    There are no Patient Instructions on file for this visit.   --Continue cardiac medications as reconciled in final medication list. --Return in about 4 weeks (around 12/06/2020) for Follow up, Dyspnea. Or sooner if needed. --Continue follow-up with your primary care  physician regarding the management of your other chronic comorbid conditions.  This note was created using a voice recognition software as a result there may be grammatical errors inadvertently enclosed that do not reflect the nature of this encounter. Every attempt is made to correct such errors.  Rex Kras, [place], [facility]  Pager: [phone] Office: [phone]

## 2021-01-27 ENCOUNTER — Ambulatory Visit: Payer: PPO | Admitting: Student

## 2021-02-01 ENCOUNTER — Ambulatory Visit: Payer: PPO | Admitting: Occupational Therapy

## 2021-02-09 ENCOUNTER — Ambulatory Visit: Payer: PPO | Admitting: Occupational Therapy

## 2021-02-10 ENCOUNTER — Ambulatory Visit: Payer: PPO | Admitting: Cardiology

## 2021-02-23 DIAGNOSIS — E785 Hyperlipidemia, unspecified: Secondary | ICD-10-CM | POA: Diagnosis not present

## 2021-02-23 DIAGNOSIS — I2510 Atherosclerotic heart disease of native coronary artery without angina pectoris: Secondary | ICD-10-CM | POA: Diagnosis not present

## 2021-02-23 DIAGNOSIS — E119 Type 2 diabetes mellitus without complications: Secondary | ICD-10-CM | POA: Diagnosis not present

## 2021-02-23 DIAGNOSIS — I10 Essential (primary) hypertension: Secondary | ICD-10-CM | POA: Diagnosis not present

## 2021-02-28 ENCOUNTER — Ambulatory Visit: Payer: PPO | Admitting: Cardiology

## 2021-03-03 ENCOUNTER — Encounter: Payer: PPO | Admitting: Occupational Therapy

## 2021-03-07 ENCOUNTER — Ambulatory Visit: Payer: PPO | Admitting: Adult Health

## 2021-03-22 ENCOUNTER — Ambulatory Visit: Payer: PPO | Admitting: Cardiology

## 2021-04-07 ENCOUNTER — Ambulatory Visit: Payer: PPO | Admitting: Cardiology

## 2021-04-11 ENCOUNTER — Ambulatory Visit: Payer: PPO | Admitting: Occupational Therapy

## 2021-04-11 IMAGING — MR MR MRA HEAD W/O CM
1 series · 26 of 48 positions shown · non-contrast
Comparison: None.

CLINICAL DATA: Acute left thalamocapsular infarct.

EXAM:
MRA HEAD WITHOUT CONTRAST
TECHNIQUE: Angiographic images of the Circle of Willis were obtained using MRA
technique without intravenous contrast.

[Series 5: 3d cow · axial · 0.5mm · 0.41mm/px · z∈[-53,+27]mm · 26 of 172 slices shown]
[im 1/172]
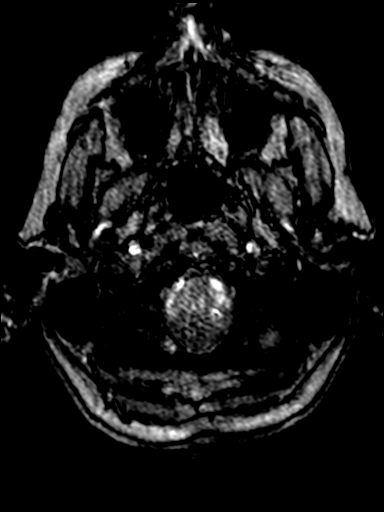
[im 4/172]
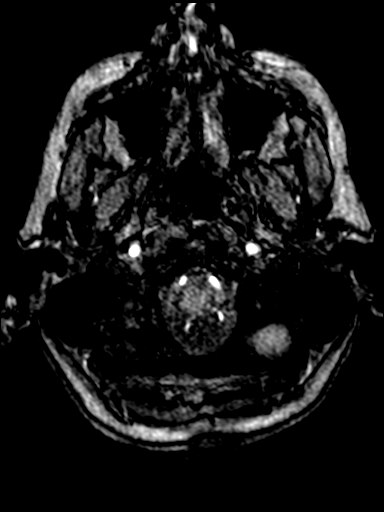
[im 8/172]
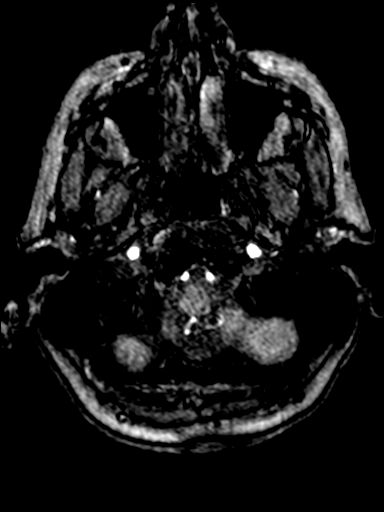
[im 11/172]
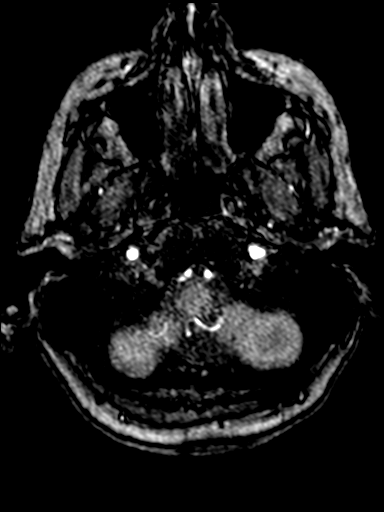
[im 15/172]
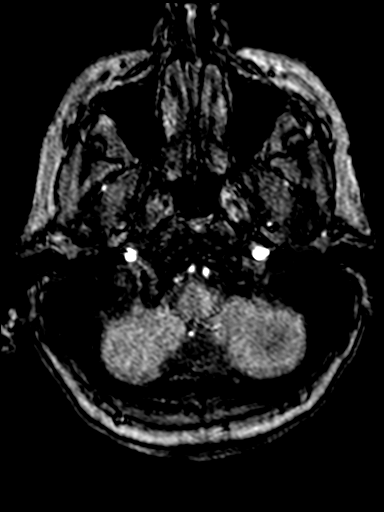
[im 19/172]
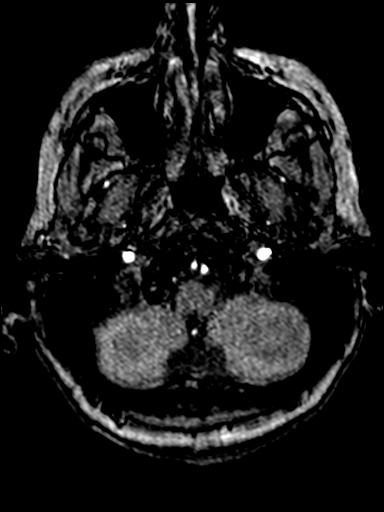
[im 22/172]
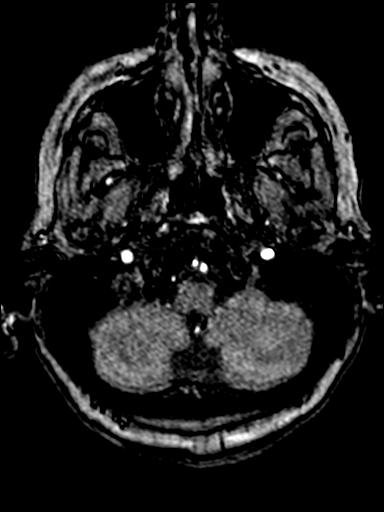
[im 26/172]
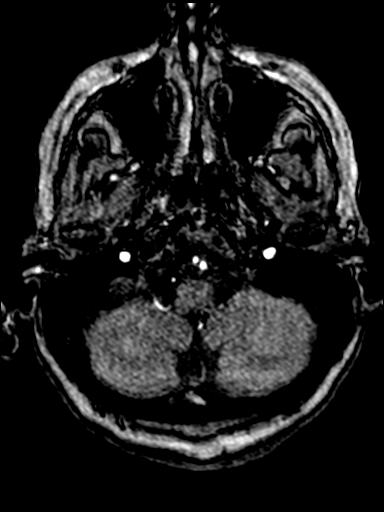
[im 30/172]
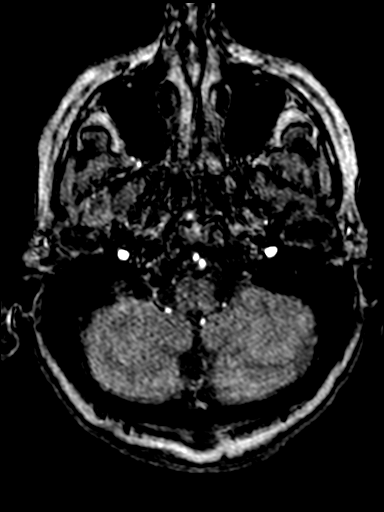
[im 33/172]
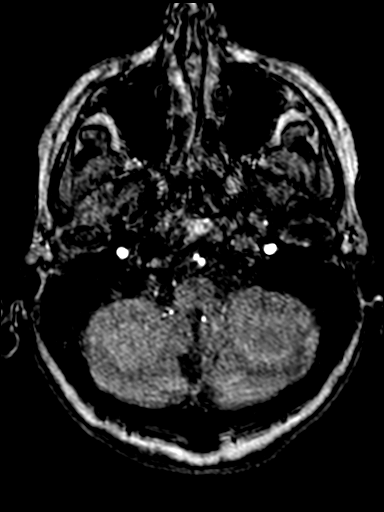
[im 37/172]
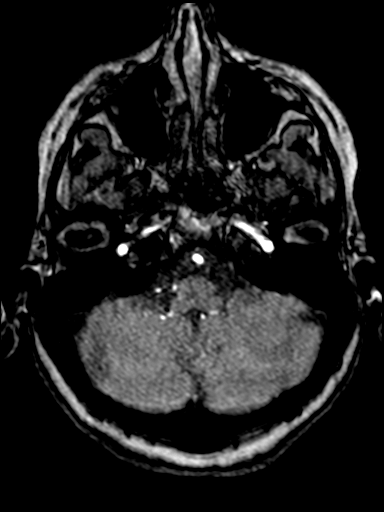
[im 41/172]
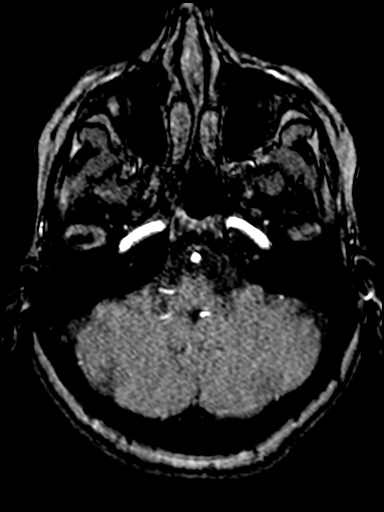
[im 44/172]
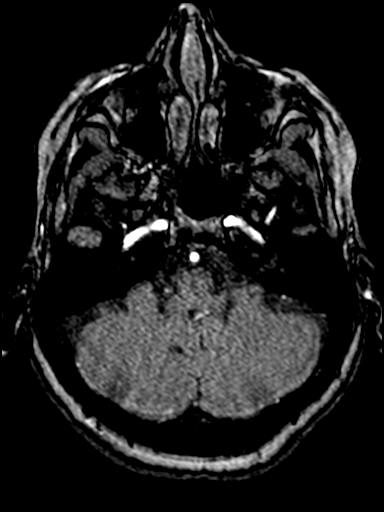
[im 48/172]
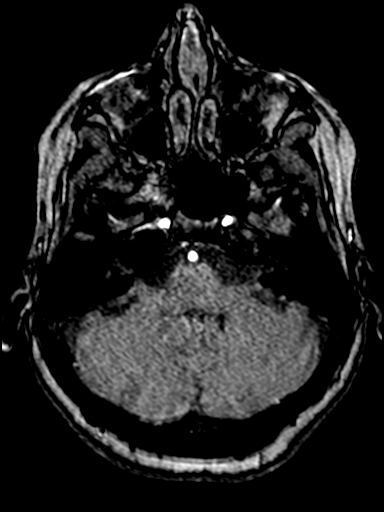
[im 51/172]
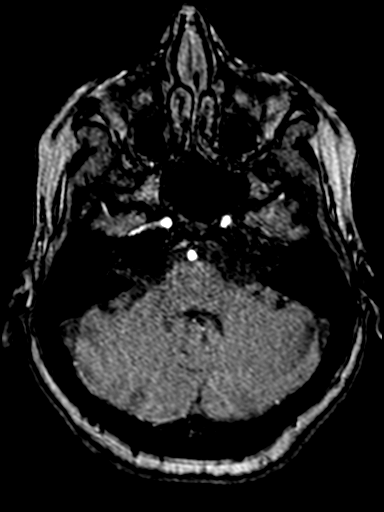
[im 55/172]
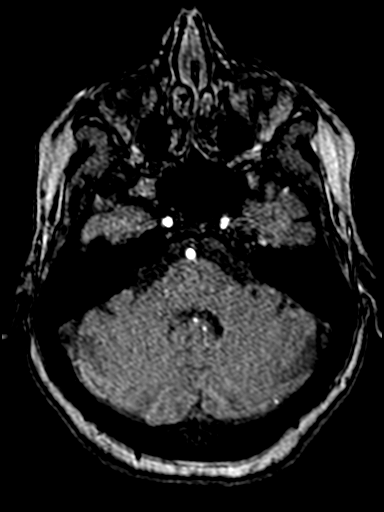
[im 59/172]
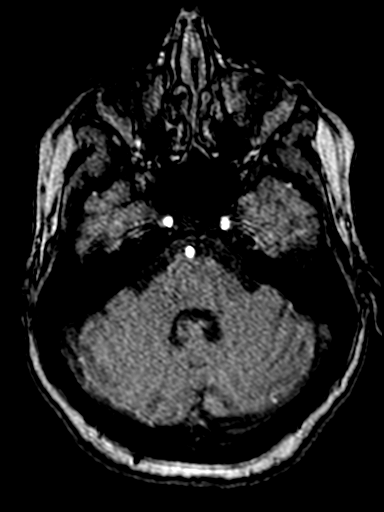
[im 62/172]
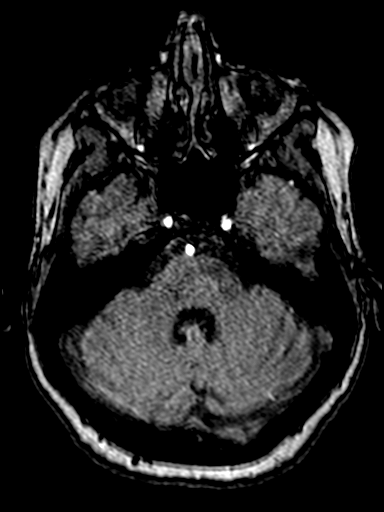
[im 66/172]
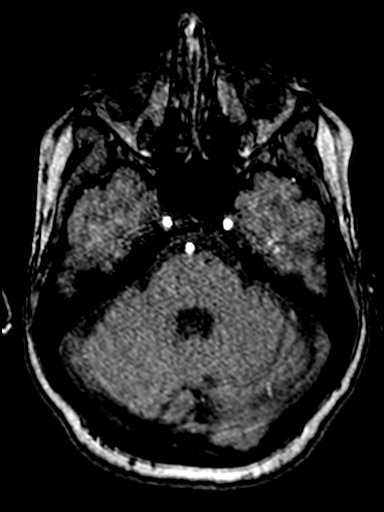
[im 77/172]
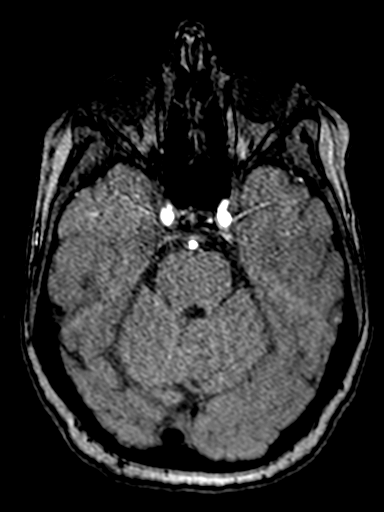
[im 88/172]
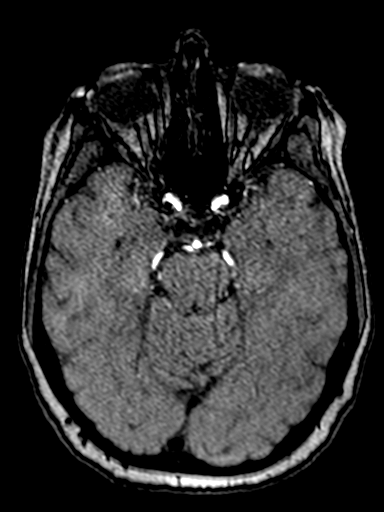
[im 99/172]
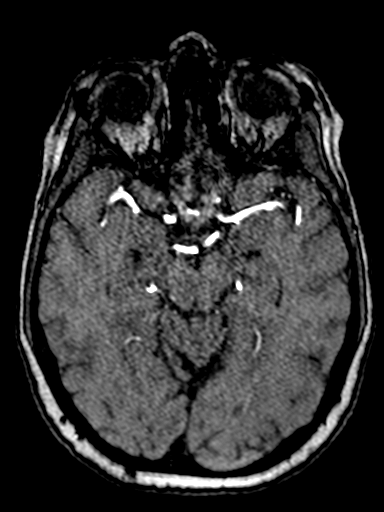
[im 121/172]
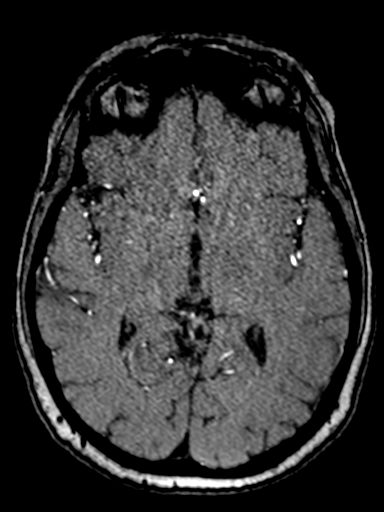
[im 142/172]
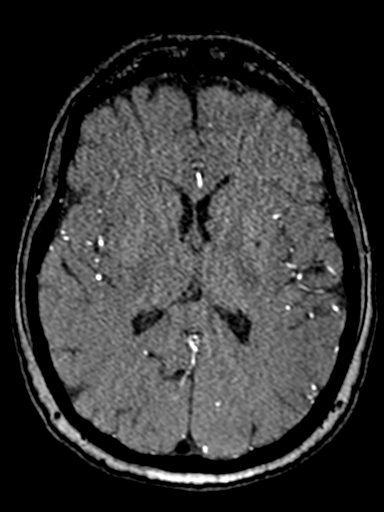
[im 146/172]
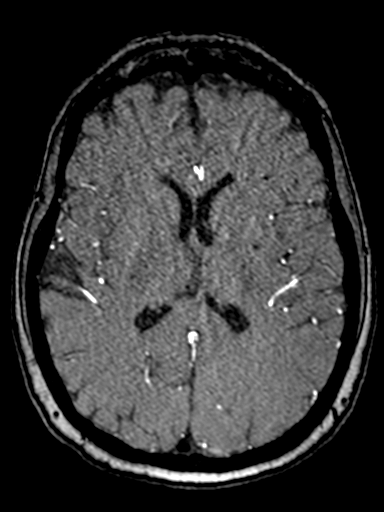
[im 164/172]
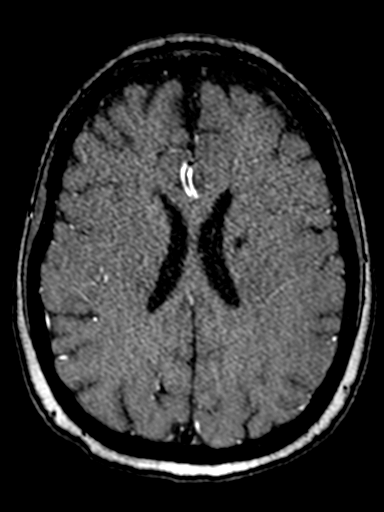

[26 of 48 positions shown; findings below may reference images not displayed]

FINDINGS: 3 mm left paraophthalmic artery aneurysm is present. There is some
atherosclerotic irregularity within the cavernous internal carotid
arteries bilaterally without significant stenosis. The A1 and M1
segments are within normal limits. MCA bifurcations are normal. No
significant proximal stenosis or occlusion is present. No additional
aneurysms are present.

The left vertebral artery is dominant. The left PICA origin is just
below the field of view. Right PICA origin is within normal limits.
Basilar artery is normal. Both posterior cerebral arteries originate
from the basilar tip. PCA branch vessels are normal.
IMPRESSION: 1. 3 mm left paraophthalmic artery aneurysm.
2. Mild atherosclerotic irregularity within the cavernous internal
carotid arteries without significant stenosis.
3. Otherwise normal MRA Circle of Willis.

## 2021-04-12 DIAGNOSIS — M545 Low back pain, unspecified: Secondary | ICD-10-CM | POA: Diagnosis not present

## 2021-04-12 DIAGNOSIS — G8929 Other chronic pain: Secondary | ICD-10-CM | POA: Diagnosis not present

## 2021-04-12 DIAGNOSIS — E559 Vitamin D deficiency, unspecified: Secondary | ICD-10-CM | POA: Diagnosis not present

## 2021-04-12 DIAGNOSIS — M129 Arthropathy, unspecified: Secondary | ICD-10-CM | POA: Diagnosis not present

## 2021-04-12 DIAGNOSIS — G894 Chronic pain syndrome: Secondary | ICD-10-CM | POA: Diagnosis not present

## 2021-04-12 DIAGNOSIS — Z79899 Other long term (current) drug therapy: Secondary | ICD-10-CM | POA: Diagnosis not present

## 2021-04-19 ENCOUNTER — Ambulatory Visit: Payer: PPO | Admitting: Cardiology

## 2021-04-22 ENCOUNTER — Other Ambulatory Visit: Payer: Self-pay | Admitting: Internal Medicine

## 2021-04-22 DIAGNOSIS — M549 Dorsalgia, unspecified: Secondary | ICD-10-CM

## 2021-04-22 DIAGNOSIS — G8929 Other chronic pain: Secondary | ICD-10-CM

## 2021-04-22 DIAGNOSIS — M5030 Other cervical disc degeneration, unspecified cervical region: Secondary | ICD-10-CM

## 2021-05-03 DIAGNOSIS — Z79899 Other long term (current) drug therapy: Secondary | ICD-10-CM | POA: Diagnosis not present

## 2021-05-03 DIAGNOSIS — Z6825 Body mass index (BMI) 25.0-25.9, adult: Secondary | ICD-10-CM | POA: Diagnosis not present

## 2021-05-03 DIAGNOSIS — G894 Chronic pain syndrome: Secondary | ICD-10-CM | POA: Diagnosis not present

## 2021-05-03 DIAGNOSIS — G8929 Other chronic pain: Secondary | ICD-10-CM | POA: Diagnosis not present

## 2021-05-03 DIAGNOSIS — E663 Overweight: Secondary | ICD-10-CM | POA: Diagnosis not present

## 2021-05-03 DIAGNOSIS — M545 Low back pain, unspecified: Secondary | ICD-10-CM | POA: Diagnosis not present

## 2021-05-05 ENCOUNTER — Ambulatory Visit: Payer: PPO | Admitting: Cardiology

## 2021-05-05 ENCOUNTER — Encounter: Payer: Self-pay | Admitting: Cardiology

## 2021-05-05 ENCOUNTER — Other Ambulatory Visit: Payer: Self-pay

## 2021-05-05 VITALS — BP 139/73 | HR 88 | Temp 97.8°F | Resp 16 | Ht 66.0 in | Wt 158.6 lb

## 2021-05-05 DIAGNOSIS — R0609 Other forms of dyspnea: Secondary | ICD-10-CM

## 2021-05-05 DIAGNOSIS — I10 Essential (primary) hypertension: Secondary | ICD-10-CM

## 2021-05-05 DIAGNOSIS — R413 Other amnesia: Secondary | ICD-10-CM

## 2021-05-05 DIAGNOSIS — E7800 Pure hypercholesterolemia, unspecified: Secondary | ICD-10-CM | POA: Diagnosis not present

## 2021-05-05 DIAGNOSIS — Z8673 Personal history of transient ischemic attack (TIA), and cerebral infarction without residual deficits: Secondary | ICD-10-CM

## 2021-05-05 NOTE — Progress Notes (Signed)
Primary Physician/Referring:  Crist Infante, MD  Patient ID: Deborah Peters, female    DOB: Aug 20, 1955, 66 y.o.   MRN: [id number]  No chief complaint on file.  HPI:    Deborah Peters  is a 66 y.o. Caucasian female patient who was seen by my partner Dr. Rex Kras, but wanted to change to me as her husband sees me.  Patient has had history of stroke in December 21, imaging studies had revealed prior old stroke as well.  She also has aortic atherosclerosis, and is a former smoker and has mild coronary calcification in the 52nd percentile in the [employer].  She presents to discuss dyspnea on exertion and primary prevention strategy.  She has no specific complaints except for chronic dyspnea, no PND orthopnea or leg edema.  She is extremely concerned about the findings on the CT angiogram of her head which had revealed small vessel disease.  She is concerned if she has smaller disease throughout the body.  Past Medical History:  Diagnosis Date   Allergy    Chronic back pain    Constipation due to opioid therapy    DDD (degenerative disc disease), lumbar    Depression    Diabetes mellitus, type 2 ([place])    Fatty liver    GERD (gastroesophageal reflux disease)    past hx of    Gestational diabetes    no meds as of 06-01-2016.  diet controlled   Heart murmur    Heart murmur    Hyperlipidemia    Hypertension    pt this pain related - id occasionally high    IFG (impaired fasting glucose)    Meralgia paraesthetica    Migraines    Post-menopausal    Scoliosis    Stroke ([place])    Ulcer    years ago. gastric ulcer.   Past Surgical History:  Procedure Laterality Date   ABDOMINAL HYSTERECTOMY     has her ovaries   BACK SURGERY  2011   L5 S1   COLONOSCOPY     JOINT REPLACEMENT     L knee   knee surgeries     x 8 before replacement    POLYPECTOMY     HPP in 2008 DB   Family History  Problem Relation Age of Onset   Stroke Mother    Heart failure Father        Triple bypass    Melanoma Father    Colon polyps Sister    Colon polyps Brother    Cancer Maternal Grandmother    Cancer Maternal Grandfather    Stomach cancer Paternal Grandfather    Esophageal cancer Neg Hx    Rectal cancer Neg Hx    Colon cancer Neg Hx    Pancreatic cancer Neg Hx     Social History   Tobacco Use   Smoking status: Former   Smokeless tobacco: Never  Substance Use Topics   Alcohol use: Not Currently    Comment: Rare   Marital Status: Married  ROS  Review of Systems  Cardiovascular:  Positive for dyspnea on exertion. Negative for chest pain and leg swelling.  Musculoskeletal:  Positive for back pain.  Objective  Blood pressure 139/73, pulse 88, temperature 97.8 F (36.6 C), temperature source Temporal, resp. rate 16, height 5\' 6"  (1.676 m), weight 158 lb 9.6 oz (71.9 kg), SpO2 95 %. Body mass index is 25.6 kg/m.  Vitals with BMI 05/05/2021 12/13/2020 12/13/2020  Height 5\' 6"  - -  Weight 158 lbs 10 oz - -  BMI 27.03 - -  Systolic 500 938 182  Diastolic 73 54 46  Pulse 88 - 66    Physical Exam Neck:     Vascular: No carotid bruit or JVD.  Cardiovascular:     Rate and Rhythm: Normal rate and regular rhythm.     Pulses: Intact distal pulses.     Heart sounds: Normal heart sounds. No murmur heard.   No gallop.  Pulmonary:     Effort: Pulmonary effort is normal.     Breath sounds: Normal breath sounds.  Abdominal:     General: Bowel sounds are normal.     Palpations: Abdomen is soft.  Musculoskeletal:     Right lower leg: No edema.     Left lower leg: No edema.    Medications and allergies   Allergies  Allergen Reactions   Sulfa Antibiotics Anaphylaxis    Medication list after today's encounter    Current Outpatient Medications:    ascorbic acid (VITAMIN C) 500 MG tablet, Take 500 mg by mouth daily., Disp: , Rfl:    atorvastatin (LIPITOR) 80 MG tablet, Take 1 tablet (80 mg total) by mouth at bedtime. (Patient taking differently: Take 80 mg by mouth at  bedtime. Hold for 4 weeks for Statin holiday - memory loss), Disp: 30 tablet, Rfl: 1   benzonatate (TESSALON) 100 MG capsule, Take 100 mg by mouth 3 (three) times daily as needed for cough., Disp: , Rfl:    clopidogrel (PLAVIX) 75 MG tablet, Take 75 mg by mouth daily., Disp: , Rfl:    diazepam (VALIUM) 5 MG tablet, Take 5 mg by mouth every 8 (eight) hours as needed for anxiety or muscle spasms., Disp: , Rfl:    DULoxetine (CYMBALTA) 60 MG capsule, Take 60 mg by mouth daily., Disp: , Rfl:    ezetimibe (ZETIA) 10 MG tablet, Take 10 mg by mouth daily., Disp: , Rfl: 3   fish oil-omega-3 fatty acids 1000 MG capsule, Take 2 g by mouth 2 (two) times daily. , Disp: , Rfl:    gabapentin (NEURONTIN) 400 MG capsule, Take 400 mg by mouth in the morning, at noon, in the evening, and at bedtime., Disp: , Rfl:    losartan (COZAAR) 25 MG tablet, Take 50 mg by mouth daily. Try 50 mg daily 05/05/2021, Disp: , Rfl:    magnesium 30 MG tablet, Take 30 mg by mouth 2 (two) times daily., Disp: , Rfl:    OVER THE COUNTER MEDICATION, Take 1 capsule by mouth daily. Tumeric, Disp: , Rfl:    OVER THE COUNTER MEDICATION, Take 1 tablet by mouth daily. Cinnamon daily, Disp: , Rfl:    OVER THE COUNTER MEDICATION, Take 1 tablet by mouth at bedtime. alteril natural sleep aide at bedtime, Disp: , Rfl:    Oxycodone HCl 10 MG TABS, Take 10 mg by mouth 5 (five) times daily., Disp: , Rfl: 0   pantoprazole (PROTONIX) 40 MG tablet, Take 1 tablet by mouth as needed., Disp: , Rfl:    promethazine (PHENERGAN) 25 MG tablet, Take 25 mg by mouth every 6 (six) hours as needed for nausea or vomiting., Disp: , Rfl:    Taurine 1000 MG CAPS, Take 1 capsule by mouth in the morning and at bedtime., Disp: , Rfl:    zolpidem (AMBIEN CR) 6.25 MG CR tablet, Take 6.25 mg by mouth at bedtime. , Disp: , Rfl: 5  .med Laboratory examination:   Recent Labs  11/25/20 1505 11/27/20 0031  NA 144 137  K 4.6 3.6  CL 103 101  CO2 27 26  GLUCOSE 162* 129*   BUN 7* 11  CREATININE 1.11* 1.00  CALCIUM 9.8 9.6  GFRNONAA  --  >60   CrCl cannot be calculated (Patient's most recent lab result is older than the maximum 21 days allowed.).  CMP Latest Ref Rng & Units 11/27/2020 11/25/2020 03/21/2020  Glucose 70 - 99 mg/dL 129(H) 162(H) 124(H)  BUN 8 - 23 mg/dL 11 7(L) 3(L)  Creatinine 0.44 - 1.00 mg/dL 1.00 1.11(H) 0.60  Sodium 135 - 145 mmol/L 137 144 139  Potassium 3.5 - 5.1 mmol/L 3.6 4.6 3.9  Chloride 98 - 111 mmol/L 101 103 105  CO2 22 - 32 mmol/L 26 27 -  Calcium 8.9 - 10.3 mg/dL 9.6 9.8 -  Total Protein 6.5 - 8.1 g/dL 6.7 7.0 -  Total Bilirubin 0.3 - 1.2 mg/dL 0.6 0.4 -  Alkaline Phos 38 - 126 U/L 52 70 -  AST 15 - 41 U/L 17 21 -  ALT 0 - 44 U/L 14 14 -   CBC Latest Ref Rng & Units 11/27/2020 03/21/2020 03/21/2020  WBC 4.0 - 10.5 K/uL 11.0(H) - 7.9  Hemoglobin 12.0 - 15.0 g/dL 12.9 12.6 12.0  Hematocrit 36.0 - 46.0 % 38.5 37.0 37.3  Platelets 150 - 400 K/uL 296 - 246   External labs:   10/18/2020: BUN 6, creatinine 0.8, sodium 138, potassium 4.7, chloride 103, bicarb 25 Hemoglobin 12.2 g/dL, hematocrit 34.6% NT proBNP 47   10/01/2020:  Total cholesterol 112, triglycerides 156, HDL 35, LDL 46, non-HDL 77. Apolipoprotein B 69 (within normal limits) TSH 1.69 A1c 6.4  Radiology:   MRI brain without contrast: 03/16/2020: 1. 1 cm acute to early subacute ischemic left thalamocapsular infarct. No associated hemorrhage or mass effect. 2. Additional remote lacunar infarct involving the adjacent left basal ganglia. 3. Underlying mild chronic microvascular ischemic disease.   MRA head without contrast: 03/17/2020 1. 3 mm left paraophthalmic artery aneurysm. 2. Mild atherosclerotic irregularity within the cavernous internal carotid arteries without significant stenosis. 3. Otherwise normal MRA Circle of Willis.  Cardiac Studies:   Echocardiogram: 11/08/2020:  1. Left ventricular ejection fraction, by estimation, is 65 to 70%. The left  ventricle has normal function. There is mild left ventricular hypertrophy. Left ventricular diastolic parameters were normal.   2. Right ventricular systolic function is normal. The right ventricular size is normal.   3. The mitral valve is normal in structure. Trivial mitral valve regurgitation.   4. The aortic valve is normal in structure. Aortic valve regurgitation is not visualized.   Coronary CTA 12/14/2018: 1. Total coronary calcium score of 2.37. This was 54th percentile for age and sex matched control. 2. Normal coronary origin with right dominance. 3. CAD-RADS = 1. 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 4. Aortic atherosclerosis.   RECOMMENDATIONS: Consider non-atherosclerotic causes of chest pain. Consider preventive therapy and risk factor modification  EKG:   EKG 11/08/2020: Normal sinus rhythm at rate of 84 bpm.  Normal EKG.    Assessment     ICD-10-CM   1. Dyspnea on exertion  R06.09     2. Primary hypertension  I10     3. Hypercholesteremia  E78.00     4. History of stroke  Z86.73     5. Memory loss  R41.3  Medications Discontinued During This Encounter  Medication Reason   furosemide (LASIX) 40 MG tablet     No orders of the defined types were placed in this encounter.  No orders of the defined types were placed in this encounter.  Recommendations:   Deborah Peters is a 66 y.o. Caucasian female patient who was seen by my partner Dr. Rex Kras, but wanted to change to me as her husband sees me.  Patient has had history of stroke in December 21, imaging studies had revealed prior old stroke as well.  She also has aortic atherosclerosis, and is a former smoker and has mild coronary calcification in the 52nd percentile in the [employer].  She presents to discuss dyspnea on exertion and primary prevention strategy.  She is accompanied by her husband, Long  discussion regarding aspects of dyspnea, hypertension, hyperlipidemia, discussion regarding prior imaging for stroke.  I discussed with her that her dyspnea is related to deconditioning and reduced physical activity due to her chronic back pain.  Do not suspect acute decompensated heart failure in the absence of normal LV systolic and diastolic function, normal physical exam.  I also discussed with her regarding elevated coronary calcium score, however she is in the 50th percentile approximately 4 range.  However she is on appropriate medical therapy and her lipids are under excellent control and at goal.  Prior stroke occurred when her LDL was not at goal.  Now that she is on guideline directed medical therapy with excellent control of hypertension, hyperlipidemia, she has quit smoking, I advised her that her risk of recurrence of stroke is certainly much lower than previous.  She is developing memory loss, advised her to hold off on taking Lipitor as a statin holiday to see if there is any improvement in this.  She is seeing her PCP very soon and they can decide if this is helping and if she needs to be changed over to a different statin like Crestor 20 mg daily.  This was a 60-minute office visit encounter, husband present at the bedside, all questions answered.  I also reviewed her coronary CT angiogram and reassured her.  As her risk factors are well controlled, she will see me back on a as needed basis.    Adrian Prows, MD, [facility] 05/06/2021, 5:04 PM Office: [phone]

## 2021-05-07 ENCOUNTER — Other Ambulatory Visit: Payer: Self-pay

## 2021-05-07 ENCOUNTER — Ambulatory Visit
Admission: RE | Admit: 2021-05-07 | Discharge: 2021-05-07 | Disposition: A | Discharge status: Home / Self Care | Payer: PPO | Source: Ambulatory Visit | Attending: Internal Medicine | Admitting: Internal Medicine

## 2021-05-07 DIAGNOSIS — M4802 Spinal stenosis, cervical region: Secondary | ICD-10-CM | POA: Diagnosis not present

## 2021-05-07 DIAGNOSIS — G8929 Other chronic pain: Secondary | ICD-10-CM

## 2021-05-07 DIAGNOSIS — M5030 Other cervical disc degeneration, unspecified cervical region: Secondary | ICD-10-CM

## 2021-05-07 DIAGNOSIS — M549 Dorsalgia, unspecified: Secondary | ICD-10-CM

## 2021-05-07 DIAGNOSIS — M40204 Unspecified kyphosis, thoracic region: Secondary | ICD-10-CM | POA: Diagnosis not present

## 2021-05-07 DIAGNOSIS — M48061 Spinal stenosis, lumbar region without neurogenic claudication: Secondary | ICD-10-CM | POA: Diagnosis not present

## 2021-05-07 DIAGNOSIS — M545 Low back pain, unspecified: Secondary | ICD-10-CM | POA: Diagnosis not present

## 2021-05-18 DIAGNOSIS — Z79899 Other long term (current) drug therapy: Secondary | ICD-10-CM | POA: Diagnosis not present

## 2021-05-18 DIAGNOSIS — M549 Dorsalgia, unspecified: Secondary | ICD-10-CM | POA: Diagnosis not present

## 2021-05-18 DIAGNOSIS — Z5181 Encounter for therapeutic drug level monitoring: Secondary | ICD-10-CM | POA: Diagnosis not present

## 2021-05-18 DIAGNOSIS — M25511 Pain in right shoulder: Secondary | ICD-10-CM | POA: Diagnosis not present

## 2021-05-18 DIAGNOSIS — G8929 Other chronic pain: Secondary | ICD-10-CM | POA: Diagnosis not present

## 2021-05-18 DIAGNOSIS — G8921 Chronic pain due to trauma: Secondary | ICD-10-CM | POA: Diagnosis not present

## 2021-05-18 DIAGNOSIS — Z9889 Other specified postprocedural states: Secondary | ICD-10-CM | POA: Diagnosis not present

## 2021-06-20 DIAGNOSIS — R051 Acute cough: Secondary | ICD-10-CM | POA: Diagnosis not present

## 2021-06-20 DIAGNOSIS — R0981 Nasal congestion: Secondary | ICD-10-CM | POA: Diagnosis not present

## 2021-06-20 DIAGNOSIS — I639 Cerebral infarction, unspecified: Secondary | ICD-10-CM | POA: Diagnosis not present

## 2021-06-20 DIAGNOSIS — R350 Frequency of micturition: Secondary | ICD-10-CM | POA: Diagnosis not present

## 2021-06-20 DIAGNOSIS — I10 Essential (primary) hypertension: Secondary | ICD-10-CM | POA: Diagnosis not present

## 2021-06-20 DIAGNOSIS — J0111 Acute recurrent frontal sinusitis: Secondary | ICD-10-CM | POA: Diagnosis not present

## 2021-06-20 DIAGNOSIS — R413 Other amnesia: Secondary | ICD-10-CM | POA: Diagnosis not present

## 2021-06-24 DIAGNOSIS — I10 Essential (primary) hypertension: Secondary | ICD-10-CM | POA: Diagnosis not present

## 2021-06-24 DIAGNOSIS — E785 Hyperlipidemia, unspecified: Secondary | ICD-10-CM | POA: Diagnosis not present

## 2021-06-24 DIAGNOSIS — E119 Type 2 diabetes mellitus without complications: Secondary | ICD-10-CM | POA: Diagnosis not present

## 2021-06-24 DIAGNOSIS — I2510 Atherosclerotic heart disease of native coronary artery without angina pectoris: Secondary | ICD-10-CM | POA: Diagnosis not present

## 2021-07-05 ENCOUNTER — Encounter: Payer: Self-pay | Admitting: Gastroenterology

## 2021-07-18 DIAGNOSIS — Z5181 Encounter for therapeutic drug level monitoring: Secondary | ICD-10-CM | POA: Diagnosis not present

## 2021-07-18 DIAGNOSIS — M549 Dorsalgia, unspecified: Secondary | ICD-10-CM | POA: Diagnosis not present

## 2021-07-18 DIAGNOSIS — G8921 Chronic pain due to trauma: Secondary | ICD-10-CM | POA: Diagnosis not present

## 2021-07-18 DIAGNOSIS — M25511 Pain in right shoulder: Secondary | ICD-10-CM | POA: Diagnosis not present

## 2021-07-18 DIAGNOSIS — G8929 Other chronic pain: Secondary | ICD-10-CM | POA: Diagnosis not present

## 2021-07-18 DIAGNOSIS — Z9889 Other specified postprocedural states: Secondary | ICD-10-CM | POA: Diagnosis not present

## 2021-07-18 DIAGNOSIS — Z79899 Other long term (current) drug therapy: Secondary | ICD-10-CM | POA: Diagnosis not present

## 2021-07-19 ENCOUNTER — Ambulatory Visit: Payer: PPO | Admitting: Adult Health

## 2021-07-19 ENCOUNTER — Telehealth: Payer: Self-pay | Admitting: Adult Health

## 2021-07-19 DIAGNOSIS — F0280 Dementia in other diseases classified elsewhere without behavioral disturbance: Secondary | ICD-10-CM | POA: Insufficient documentation

## 2021-07-19 NOTE — Progress Notes (Deleted)
[facility] [address] [place]. [place] [address] [phone]       STROKE FOLLOW UP NOTE  Ms. NUHA DEGNER Date of Birth:  [birth year] Medical Record Number:  [id number]   Reason for Referral: stroke follow up  Virtual Visit via Video Note  Virtual visit completed through [employer], a video enabled telemedicine application. Due to national recommendations of social distancing due to COVID-19, a virtual visit is felt to be most appropriate for this patient at this time. Reviewed limitations, risks, security and privacy concerns of performing a virtual visit and the availability of in person appointments. I also reviewed that there may be a patient responsible charge related to this service. The patient agreed to proceed.    Patient location: home Provider location: in office, [facility] Persons participating in this virtual visit: patient, provider    SUBJECTIVE:   HPI:   Update 07/19/2021 JM: Patient returns for 60-monthstroke follow-up.  Overall stable without new stroke/TIA symptoms.  Reports continued ***.   Compliant on Plavix and atorvastatin, denies side effects.  Blood pressure today ***.  Routinely follows with PCP Dr. PJoylene Draftand cardiologist Dr. GEinar Gip  She is also followed by pain clinic for lower back, right shoulder and right leg pain.        History provided for reference purposes only Update 01/11/2021 JM: Returns for overdue stroke follow-up via MyChart video visit after prior visit approximately 8 months ago. She does report right hand, arm and shoulder pain and tightness which has been slowly progressive - can be intermittent or occur in clusters - can be present over a week and other times wont have any pain. Greater difficulty functioning with right hand during increased pain especially as hx of fracture to right pointer finger and 4th digit trigger finger. She questions possible benefit with therapy.  Otherwise, stable  from stroke standpoint without new stroke/TIA symptoms.  Reports compliance on Plavix and atorvastatin without side effects.  Routinely follows with PCP.  Also reports syncopal episodes which initially started back in April and then subsided and restarted around July/August. Of note, she was started on furosemide back in April for lower extremity swelling.  She has remained on furosemide 40 mg daily.  Reports syncopal event last week and prior to that 7 to 10 days ago.  Episodes usually occur when going from sitting to standing or when walking which leads to falls and has hit her head on 2 of her falls but did not experience any type of headache or neurological symptoms. At times, may not have much of a warning but other times will experience lightheadedness/dizziness sensation, generally not feeling well and physically not stable.  At times she can feel the symptoms which do not progress to passing out.  Fluctuation of blood pressure readings with u[place]weeks average 110/60 and on 10/16, she was not feeling well and felt dizzy with worsening LE edema -blood pressure at that time 98/56.  She does endorse occasional low readings with SBP 90 or less. She is unable to state how long she is out for but once she comes to, denies any type of confusion, fatigue, nausea/vomiting or bladder/bowel incontinence.  She was seen by cardiology 8/15 for evaluation of dyspnea and concern of CHF -discussed syncopal events who felt possibly in setting of polypharmacy (Valium, duloxetine, Lasix, gabapentin, oxycodone) and further eval for syncope not recommended at that time such as with heart monitor as episodes infrequent at that time.  She did  have scheduled follow-up today with cardiology but canceled as provider not in office.    Initial visit 05/17/2020 JM: Mrs. Kopinski is being seen for hospital follow-up accompanied by her husband.   Reports residual right arm and hand numbness/tingling which has been gradually improving.  She  also reports mild cognitive difficulties which have been present prior to her stroke and slightly worsened with recent stroke She did have episode of left sided numbness which quickly resolved - evaluated in ED by neurology who felt symptom pattern not typical for acute stroke - no additional symptoms since that time Denies new stroke/TIA symptoms  Completed 3 months DAPT and remains on Plavix alone without bleeding or bruising Remains on atorvastatin 80 mg daily without myalgias Blood pressure today 116/67 Continued tobacco use with plans on quitting in the near future  She continues to follow neurosurgery for chronic lower back pain   Stroke admission 03/17/2020 Ms. BOBBI YOUNT is a 66 y.o. female with history of diabetes, hypertension, hyperlipidemia, meralgia paresthetica, heart murmur, and migraines who presented to [facility] ED on 03/17/2020 with 4d hx of R face, arm and leg numbness with difficulty walking.   Personally reviewed hospitalization pertinent progress notes, lab work and imaging with summary provided.  Evaluated by Dr. 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  Recommended DAPT for 3 weeks and Plavix alone as previously on aspirin.  History of HTN stable.  History of HLD on pravastatin 20, Zetia 10 and fish oil -direct LDL 95 with LDL UTC d/t TG 467.  Recommended transitioning to atorvastatin 80 mg daily but per hospital notes, patient tried multiple statins previously with PCP Dr. Abner Greenspan and chose to continue on pravastatin at discharge.  Controlled DM with A1c 5.7.  Other stroke risk factors include prior strokes on imaging, current tobacco use, history of EtOH use and migraines.    Evaluated by therapies and discharged home in stable condition without therapy needs.  Stroke:   L thalamic infarct secondary to small vessel disease   MRI  Subacute L thalamocapsular infarct. Remote L basal ganglia lacune. Small vessel disease.  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  No cardiac source of embolism. LDL UTC d/t TG 467, direct LDL 95.1  HgbA1c 5.7 aspirin 81 mg daily prior to admission, now on aspirin 325 mg daily. Decrease aspirin to 81 and add plavix 75. Continue DAPT x 3 weeks then plavix alone.  Therapy recommendations:   No OT or PT follow-up  Disposition: Home     ROS:   14 system review of systems performed and negative with exception of those listed in HPI  PMH:  Past Medical History:  Diagnosis Date   Allergy    Chronic back pain    Constipation due to opioid therapy    DDD (degenerative disc disease), lumbar    Depression    Diabetes mellitus, type 2 ([place]    Fatty liver    GERD (gastroesophageal reflux disease)    past hx of    Gestational diabetes    no meds as of 06-01-2016.  diet controlled   Heart murmur    Heart murmur    Hyperlipidemia    Hypertension    pt this pain related - id occasionally high    IFG (impaired fasting glucose)    Meralgia paraesthetica    Migraines  Post-menopausal    Scoliosis    Stroke [facility])    Ulcer    years ago. gastric ulcer.    PSH:  Past Surgical History:  Procedure Laterality Date   ABDOMINAL HYSTERECTOMY     has her ovaries   BACK SURGERY  2011   L5 S1   COLONOSCOPY     JOINT REPLACEMENT     L knee   knee surgeries     x 8 before replacement    POLYPECTOMY     HPP in 2008 DB    Social History:  Social History   Socioeconomic History   Marital status: Married    Spouse name: Not on file   Number of children: 2   Years of education: Not on file   Highest education level: Not on file  Occupational History   Occupation: disabled  Tobacco Use   Smoking status: Former   Smokeless tobacco: Never  Scientific laboratory technician Use: Never used  Substance and Sexual Activity   Alcohol use: Not Currently    Comment: Rare   Drug use: No   Sexual  activity: Not on file  Other Topics Concern   Not on file  Social History Narrative   Not on file   Social Determinants of Health   Financial Resource Strain: Not on file  Food Insecurity: Not on file  Transportation Needs: Not on file  Physical Activity: Not on file  Stress: Not on file  Social Connections: Not on file  Intimate Partner Violence: Not on file    Family History:  Family History  Problem Relation Age of Onset   Stroke Mother    Heart failure Father        Triple bypass   Melanoma Father    Colon polyps Sister    Colon polyps Brother    Cancer Maternal Grandmother    Cancer Maternal Grandfather    Stomach cancer Paternal Grandfather    Esophageal cancer Neg Hx    Rectal cancer Neg Hx    Colon cancer Neg Hx    Pancreatic cancer Neg Hx     Medications:   Current Outpatient Medications on File Prior to Visit  Medication Sig Dispense Refill   ascorbic acid (VITAMIN C) 500 MG tablet Take 500 mg by mouth daily.     atorvastatin (LIPITOR) 80 MG tablet Take 1 tablet (80 mg total) by mouth at bedtime. (Patient taking differently: Take 80 mg by mouth at bedtime. Hold for 4 weeks for Statin holiday - memory loss) 30 tablet 1   benzonatate (TESSALON) 100 MG capsule Take 100 mg by mouth 3 (three) times daily as needed for cough.     clopidogrel (PLAVIX) 75 MG tablet Take 75 mg by mouth daily.     diazepam (VALIUM) 5 MG tablet Take 5 mg by mouth every 8 (eight) hours as needed for anxiety or muscle spasms.     DULoxetine (CYMBALTA) 60 MG capsule Take 60 mg by mouth daily.     ezetimibe (ZETIA) 10 MG tablet Take 10 mg by mouth daily.  3   fish oil-omega-3 fatty acids 1000 MG capsule Take 2 g by mouth 2 (two) times daily.      gabapentin (NEURONTIN) 400 MG capsule Take 400 mg by mouth in the morning, at noon, in the evening, and at bedtime.     losartan (COZAAR) 25 MG tablet Take 50 mg by mouth daily. Try 50 mg daily 05/05/2021     magnesium  30 MG tablet Take 30 mg by  mouth 2 (two) times daily.     OVER THE COUNTER MEDICATION Take 1 capsule by mouth daily. Tumeric     OVER THE COUNTER MEDICATION Take 1 tablet by mouth daily. Cinnamon daily     OVER THE COUNTER MEDICATION Take 1 tablet by mouth at bedtime. alteril natural sleep aide at bedtime     Oxycodone HCl 10 MG TABS Take 10 mg by mouth 5 (five) times daily.  0   pantoprazole (PROTONIX) 40 MG tablet Take 1 tablet by mouth as needed.     promethazine (PHENERGAN) 25 MG tablet Take 25 mg by mouth every 6 (six) hours as needed for nausea or vomiting.     Taurine 1000 MG CAPS Take 1 capsule by mouth in the morning and at bedtime.     zolpidem (AMBIEN CR) 6.25 MG CR tablet Take 6.25 mg by mouth at bedtime.   5   No current facility-administered medications on file prior to visit.    Allergies:   Allergies  Allergen Reactions   Sulfa Antibiotics Anaphylaxis      OBJECTIVE: There were no vitals filed for this visit. There is no height or weight on file to calculate BMI.  General: well developed, well nourished, pleasant elderly Caucasian female, seated, in no evident distress Head: head normocephalic and atraumatic.   Neck: supple with no carotid or supraclavicular bruits Cardiovascular: regular rate and rhythm, no murmurs Musculoskeletal: no deformity Skin:  no rash/petichiae Vascular:  Normal pulses all extremities   Neurologic Exam Mental Status: Awake and fully alert. Oriented to place and time. Recent and remote memory intact. Attention span, concentration and fund of knowledge appropriate. Mood and affect appropriate.  Cranial Nerves: Fundoscopic exam reveals sharp disc margins. Pupils equal, briskly reactive to light. Extraocular movements full without nystagmus. Visual fields full to confrontation. Hearing intact. Facial sensation intact. Face, tongue, palate moves normally and symmetrically.  Motor: Normal bulk and tone. Normal strength in all tested extremity muscles Sensory.: intact to  touch , pinprick , position and vibratory sensation.  Coordination: Rapid alternating movements normal in all extremities. Finger-to-nose and heel-to-shin performed accurately bilaterally. Gait and Station: Arises from chair without difficulty. Stance is normal. Gait demonstrates normal stride length and balance ***. Tandem walk and heel toe ***.  Reflexes: 1+ and symmetric. Toes downgoing.          ASSESSMENT: Deborah Peters is a 66 y.o. 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  Vascular risk factors include HTN, HLD, DM, prior stroke on imaging, current tobacco use, history of EtOH use and migraines. Onset of presyncope/syncopal events started around April     PLAN:  Left thalamic stroke:  Residual deficit: RUE intermittent spasticity -referral placed to neuro rehab OT.  Due to being on multiple medications and syncopal issues, would not recommend adding new medication at this time. If not improvement with therapy, may consider referral for Botox Continue clopidogrel 75 mg daily  and atorvastatin 80 mg daily, Zetia 10 mg daily and fish oil for secondary stroke prevention including BP goal<130/90, HLD with LDL goal<70 and DM with A1c.<7 Discussed secondary stroke prevention measures and importance of close PCP follow up for aggressive stroke risk factor management  Presyncope/syncope: based on reported symptoms, appears to be more cardiac related especially as present since initiating furosemide possibly low  blood pressure or cardiac arrhythmia - advised to contact cardiology to further discuss further evaluation  Tobacco use: Discussed importance of complete tobacco cessation.     Follow up in 6 months or call earlier if needed  CC:  [facility] provider: Dr. Mont Dutton, MD   Dr. Terri Skains (cardiology)   I spent 38 minutes of face-to-face and  non-face-to-face time with patient via [place] video visit.  This included previsit chart review, lab review, study review, order entry, electronic health record documentation, patient education regarding prior stroke including etiology, residual deficits and onset of spasticity, importance of managing stroke risk factors and secondary stroke prevention measures, onset of syncopal events and possible etiologies and answered all other questions to patients satisfaction   Frann Rider, AGNP-BC  [facility] [address] [place], [place] [address]  Phone [phone] Fax [phone] Note: This document was prepared with digital dictation and possible smart phrase technology. Any transcriptional errors that result from this process are unintentional.

## 2021-07-19 NOTE — Telephone Encounter (Signed)
Pt cancelling appt due to having low grade fever, congested, headache. ?

## 2021-07-21 ENCOUNTER — Ambulatory Visit: Payer: PPO | Admitting: Occupational Therapy

## 2021-08-08 DIAGNOSIS — G8929 Other chronic pain: Secondary | ICD-10-CM | POA: Diagnosis not present

## 2021-08-08 DIAGNOSIS — Z79899 Other long term (current) drug therapy: Secondary | ICD-10-CM | POA: Diagnosis not present

## 2021-08-08 DIAGNOSIS — M549 Dorsalgia, unspecified: Secondary | ICD-10-CM | POA: Diagnosis not present

## 2021-08-08 DIAGNOSIS — M25511 Pain in right shoulder: Secondary | ICD-10-CM | POA: Diagnosis not present

## 2021-08-08 DIAGNOSIS — Z9889 Other specified postprocedural states: Secondary | ICD-10-CM | POA: Diagnosis not present

## 2021-08-08 DIAGNOSIS — Z5181 Encounter for therapeutic drug level monitoring: Secondary | ICD-10-CM | POA: Diagnosis not present

## 2021-08-08 DIAGNOSIS — G8921 Chronic pain due to trauma: Secondary | ICD-10-CM | POA: Diagnosis not present

## 2021-08-10 ENCOUNTER — Telehealth: Payer: Self-pay | Admitting: Adult Health

## 2021-08-10 NOTE — Telephone Encounter (Signed)
LVM and sent MyChart message informing pt of r/s needed for 7/6 appt- Janett Billow out. ?

## 2021-08-23 DIAGNOSIS — M549 Dorsalgia, unspecified: Secondary | ICD-10-CM | POA: Diagnosis not present

## 2021-08-23 DIAGNOSIS — G8921 Chronic pain due to trauma: Secondary | ICD-10-CM | POA: Diagnosis not present

## 2021-08-23 DIAGNOSIS — Z9889 Other specified postprocedural states: Secondary | ICD-10-CM | POA: Diagnosis not present

## 2021-08-24 DIAGNOSIS — I10 Essential (primary) hypertension: Secondary | ICD-10-CM | POA: Diagnosis not present

## 2021-08-24 DIAGNOSIS — E1169 Type 2 diabetes mellitus with other specified complication: Secondary | ICD-10-CM | POA: Diagnosis not present

## 2021-08-24 DIAGNOSIS — E785 Hyperlipidemia, unspecified: Secondary | ICD-10-CM | POA: Diagnosis not present

## 2021-09-12 ENCOUNTER — Telehealth: Payer: PPO | Admitting: Adult Health

## 2021-09-12 NOTE — Progress Notes (Deleted)
[facility] [address] [place]. [place] [address] [phone]       STROKE FOLLOW UP NOTE  [name] Date of Birth:  [birth year] Medical Record Number:  [id number]   Reason for Referral: stroke follow up  Virtual Visit via Video Note  Virtual visit completed through [place], a video enabled telemedicine application. Due to national recommendations of social distancing due to COVID-19, a virtual visit is felt to be most appropriate for this patient at this time. Reviewed limitations, risks, security and privacy concerns of performing a virtual visit and the availability of in person appointments. I also reviewed that there may be a patient responsible charge related to this service. The patient agreed to proceed.    Patient location: home Provider location: in office, [facility] Persons participating in this virtual visit: patient, provider    SUBJECTIVE:   HPI:   Update 09/12/2021 JM: Patient returns for stroke follow-up via [place] video visit after prior visit 8 months ago.  Overall stable from stroke standpoint without new stroke/TIA symptoms.  Does report continued RUE weakness and pain which has been stable since prior visit.  Compliant on Plavix and atorvastatin, denies side effects.  Blood pressure ***.  Routinely follows with PCP Deborah Peters.        History provided for reference purposes only Update 01/11/2021 JM: Returns for overdue stroke follow-up via MyChart video visit after prior visit approximately 8 months ago. She does report right hand, arm and shoulder pain and tightness which has been slowly progressive - can be intermittent or occur in clusters - can be present over a week and other times wont have any pain. Greater difficulty functioning with right hand during increased pain especially as hx of fracture to right pointer finger and 4th digit trigger finger. She questions possible benefit with therapy.   Otherwise, stable from stroke standpoint without new stroke/TIA symptoms.  Reports compliance on Plavix and atorvastatin without side effects.  Routinely follows with PCP.  Also reports syncopal episodes which initially started back in April and then subsided and restarted around July/August. Of note, she was started on furosemide back in April for lower extremity swelling.  She has remained on furosemide 40 mg daily.  Reports syncopal event last week and prior to that 7 to 10 days ago.  Episodes usually occur when going from sitting to standing or when walking which leads to falls and has hit her head on 2 of her falls but did not experience any type of headache or neurological symptoms. At times, may not have much of a warning but other times will experience lightheadedness/dizziness sensation, generally not feeling well and physically not stable.  At times she can feel the symptoms which do not progress to passing out.  Fluctuation of blood pressure readings with [place] weeks average 110/60 and on 10/16, she was not feeling well and felt dizzy with worsening LE edema -blood pressure at that time 98/56.  She does endorse occasional low readings with SBP 90 or less. She is unable to state how long she is out for but once she comes to, denies any type of confusion, fatigue, nausea/vomiting or bladder/bowel incontinence.  She was seen by cardiology 8/15 for evaluation of dyspnea and concern of CHF -discussed syncopal events who felt possibly in setting of polypharmacy (Valium, duloxetine, Lasix, gabapentin, oxycodone) and further eval for syncope not recommended at that time such as with heart monitor as episodes infrequent at that time.  She did  have scheduled follow-up today with cardiology but canceled as provider not in office.    Initial visit 05/17/2020 JM: Deborah Peters is being seen for hospital follow-up accompanied by her husband.   Reports residual right arm and hand numbness/tingling which has been  gradually improving.  She also reports mild cognitive difficulties which have been present prior to her stroke and slightly worsened with recent stroke She did have episode of left sided numbness which quickly resolved - evaluated in ED by neurology who felt symptom pattern not typical for acute stroke - no additional symptoms since that time Denies new stroke/TIA symptoms  Completed 3 months DAPT and remains on Plavix alone without bleeding or bruising Remains on atorvastatin 80 mg daily without myalgias Blood pressure today 116/67 Continued tobacco use with plans on quitting in the near future  She continues to follow neurosurgery for chronic lower back pain   Stroke admission 03/17/2020 Deborah Peters is a 66 y.o. female with history of diabetes, hypertension, hyperlipidemia, meralgia paresthetica, heart murmur, and migraines who presented to [facility] ED on 03/17/2020 with 4d hx of R face, arm and leg numbness with difficulty walking.   Personally reviewed hospitalization pertinent progress notes, lab work and imaging with summary provided.  Evaluated by Dr. 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  Recommended DAPT for 3 weeks and Plavix alone as previously on aspirin.  History of HTN stable.  History of HLD on pravastatin 20, Zetia 10 and fish oil -direct LDL 95 with LDL UTC d/t TG 467.  Recommended transitioning to atorvastatin 80 mg daily but per hospital notes, patient tried multiple statins previously with PCP Dr. Abner Peters and chose to continue on pravastatin at discharge.  Controlled DM with A1c 5.7.  Other stroke risk factors include prior strokes on imaging, current tobacco use, history of EtOH use and migraines.    Evaluated by therapies and discharged home in stable condition without therapy needs.  Stroke:   L thalamic infarct secondary to small vessel disease   MRI  Subacute L thalamocapsular infarct. Remote L basal ganglia lacune.  Small vessel disease.  MRA  8m L paraopthalmic artery aneurysm. Mild atherosclerosis  Carotid Doppler bilateral no carotid stenosis 2D Echo  EF 60 to 65%.  No cardiac source of embolism. LDL UTC d/t TG 467, direct LDL 95.1  HgbA1c 5.7 aspirin 81 mg daily prior to admission, now on aspirin 325 mg daily. Decrease aspirin to 81 and add plavix 75. Continue DAPT x 3 weeks then plavix alone.  Therapy recommendations:   No OT or PT follow-up  Disposition: Home     ROS:   14 system review of systems performed and negative with exception of those listed in HPI  PMH:  Past Medical History:  Diagnosis Date   Allergy    Chronic back pain    Constipation due to opioid therapy    DDD (degenerative disc disease), lumbar    Depression    Diabetes mellitus, type 2 ([place]    Fatty liver    GERD (gastroesophageal reflux disease)    past hx of    Gestational diabetes    no meds as of 06-01-2016.  diet controlled   Heart murmur    Heart murmur    Hyperlipidemia    Hypertension    pt this pain related - id occasionally high    IFG (impaired fasting glucose)    Meralgia paraesthetica    Migraines  Post-menopausal    Scoliosis    Stroke [facility])    Ulcer    years ago. gastric ulcer.    PSH:  Past Surgical History:  Procedure Laterality Date   ABDOMINAL HYSTERECTOMY     has her ovaries   BACK SURGERY  2011   L5 S1   COLONOSCOPY     JOINT REPLACEMENT     L knee   knee surgeries     x 8 before replacement    POLYPECTOMY     HPP in 2008 DB    Social History:  Social History   Socioeconomic History   Marital status: Married    Spouse name: Not on file   Number of children: 2   Years of education: Not on file   Highest education level: Not on file  Occupational History   Occupation: disabled  Tobacco Use   Smoking status: Former   Smokeless tobacco: Never  Scientific laboratory technician Use: Never used  Substance and Sexual Activity   Alcohol use: Not Currently    Comment: Rare    Drug use: No   Sexual activity: Not on file  Other Topics Concern   Not on file  Social History Narrative   Not on file   Social Determinants of Health   Financial Resource Strain: Not on file  Food Insecurity: Not on file  Transportation Needs: Not on file  Physical Activity: Not on file  Stress: Not on file  Social Connections: Not on file  Intimate Partner Violence: Not on file    Family History:  Family History  Problem Relation Age of Onset   Stroke Mother    Heart failure Father        Triple bypass   Melanoma Father    Colon polyps Sister    Colon polyps Brother    Cancer Maternal Grandmother    Cancer Maternal Grandfather    Stomach cancer Paternal Grandfather    Esophageal cancer Neg Hx    Rectal cancer Neg Hx    Colon cancer Neg Hx    Pancreatic cancer Neg Hx     Medications:   Current Outpatient Medications on File Prior to Visit  Medication Sig Dispense Refill   ascorbic acid (VITAMIN C) 500 MG tablet Take 500 mg by mouth daily.     atorvastatin (LIPITOR) 80 MG tablet Take 1 tablet (80 mg total) by mouth at bedtime. (Patient taking differently: Take 80 mg by mouth at bedtime. Hold for 4 weeks for Statin holiday - memory loss) 30 tablet 1   benzonatate (TESSALON) 100 MG capsule Take 100 mg by mouth 3 (three) times daily as needed for cough.     clopidogrel (PLAVIX) 75 MG tablet Take 75 mg by mouth daily.     diazepam (VALIUM) 5 MG tablet Take 5 mg by mouth every 8 (eight) hours as needed for anxiety or muscle spasms.     DULoxetine (CYMBALTA) 60 MG capsule Take 60 mg by mouth daily.     ezetimibe (ZETIA) 10 MG tablet Take 10 mg by mouth daily.  3   fish oil-omega-3 fatty acids 1000 MG capsule Take 2 g by mouth 2 (two) times daily.      gabapentin (NEURONTIN) 400 MG capsule Take 400 mg by mouth in the morning, at noon, in the evening, and at bedtime.     losartan (COZAAR) 25 MG tablet Take 50 mg by mouth daily. Try 50 mg daily 05/05/2021     magnesium  30 MG  tablet Take 30 mg by mouth 2 (two) times daily.     OVER THE COUNTER MEDICATION Take 1 capsule by mouth daily. Tumeric     OVER THE COUNTER MEDICATION Take 1 tablet by mouth daily. Cinnamon daily     OVER THE COUNTER MEDICATION Take 1 tablet by mouth at bedtime. alteril natural sleep aide at bedtime     Oxycodone HCl 10 MG TABS Take 10 mg by mouth 5 (five) times daily.  0   pantoprazole (PROTONIX) 40 MG tablet Take 1 tablet by mouth as needed.     promethazine (PHENERGAN) 25 MG tablet Take 25 mg by mouth every 6 (six) hours as needed for nausea or vomiting.     Taurine 1000 MG CAPS Take 1 capsule by mouth in the morning and at bedtime.     zolpidem (AMBIEN CR) 6.25 MG CR tablet Take 6.25 mg by mouth at bedtime.   5   No current facility-administered medications on file prior to visit.    Allergies:   Allergies  Allergen Reactions   Sulfa Antibiotics Anaphylaxis      OBJECTIVE:  Physical Exam Limited due to visit type General: well developed, well nourished,  pleasant middle-age Caucasian female, seated, in no evident distress  Neurologic Exam Mental Status: Awake and fully alert.   Fluent speech and language.  Oriented to place and time. Recent memory subjectively impaired and remote memory intact. Attention span, concentration and fund of knowledge appropriate. Mood and affect appropriate.  Motor: No evidence of pronator drift Coordination: Rapid alternating movements normal in all extremities. Finger-to-nose and heel-to-shin performed accurately bilaterally.       ASSESSMENT: Deborah Peters is a 66 y.o. 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  Vascular risk factors include HTN, HLD, DM, prior stroke on imaging, current tobacco use, history of EtOH use and migraines. Onset of presyncope/syncopal events started around  April     PLAN:  Left thalamic stroke:  Residual deficit: RUE intermittent spasticity -referral placed to neuro rehab OT.  Due to being on multiple medications and syncopal issues, would not recommend adding new medication at this time. If not improvement with therapy, may consider referral for Botox Continue clopidogrel 75 mg daily  and atorvastatin 80 mg daily, Zetia 10 mg daily and fish oil for secondary stroke prevention.  Discussed secondary stroke prevention measures and importance of close PCP follow up for aggressive stroke risk factor management including BP goal<130/90, HLD with LDL goal<70 and DM with A1c.<7  Presyncope/syncope: based on reported symptoms, appears to be more cardiac related especially as present since initiating furosemide possibly low blood pressure or cardiac arrhythmia - advised to contact cardiology to further discuss further evaluation  Tobacco use: Discussed importance of complete tobacco cessation.     Follow up in 6 months or call earlier if needed  CC:  [facility] provider: Dr. Mont Dutton, MD   Dr. Terri Skains (cardiology)   I spent 38 minutes of face-to-face and non-face-to-face time with patient via [place] video visit.  This included previsit chart review, lab review, study review, order entry, electronic health record documentation, patient education regarding prior stroke including etiology, residual deficits and onset of spasticity, importance of managing stroke risk factors and secondary stroke prevention measures, onset of syncopal events and possible etiologies and answered all other questions to patients satisfaction  Frann Rider, AGNP-BC  [facility] [address] [place], [place] [address]  Phone [phone] Fax [phone] Note: This document was prepared with digital dictation and possible smart phrase technology. Any transcriptional errors that result from this process are unintentional.

## 2021-09-29 ENCOUNTER — Ambulatory Visit: Payer: PPO | Admitting: Adult Health

## 2021-10-19 ENCOUNTER — Encounter: Payer: Self-pay | Admitting: Adult Health

## 2021-10-19 ENCOUNTER — Ambulatory Visit: Payer: PPO | Admitting: Adult Health

## 2021-10-19 NOTE — Progress Notes (Deleted)
[facility] [address] [place]. [place] [address] [phone]       STROKE FOLLOW UP NOTE  [name] Date of Birth:  07-19-1955 Medical Record Number:  [id number]   Reason for Referral: stroke follow up    SUBJECTIVE:   HPI:   Update 10/19/2021 JM: Patient returns for stroke follow-up after prior visit 9 months ago.  Overall stable without new stroke/TIA symptoms.  Reports residual ***.   She is currently being followed by pain management at [facility] on Plavix and atorvastatin, denies side effects Blood pressure today *** Routinely follows with PCP Dr. Joylene Draft and cardiologist Dr. Einar Gip      History provided for reference purposes only Update 01/11/2021 JM: Returns for overdue stroke follow-up via MyChart video visit after prior visit approximately 8 months ago. She does report right hand, arm and shoulder pain and tightness which has been slowly progressive - can be intermittent or occur in clusters - can be present over a week and other times wont have any pain. Greater difficulty functioning with right hand during increased pain especially as hx of fracture to right pointer finger and 4th digit trigger finger. She questions possible benefit with therapy.  Otherwise, stable from stroke standpoint without new stroke/TIA symptoms.  Reports compliance on Plavix and atorvastatin without side effects.  Routinely follows with PCP.  Also reports syncopal episodes which initially started back in April and then subsided and restarted around July/August. Of note, she was started on furosemide back in April for lower extremity swelling.  She has remained on furosemide 40 mg daily.  Reports syncopal event last week and prior to that 7 to 10 days ago.  Episodes usually occur when going from sitting to standing or when walking which leads to falls and has hit her head on 2 of her falls but did not experience any type of headache or neurological  symptoms. At times, may not have much of a warning but other times will experience lightheadedness/dizziness sensation, generally not feeling well and physically not stable.  At times she can feel the symptoms which do not progress to passing out.  Fluctuation of blood pressure readings with [place] weeks average 110/60 and on 10/16, she was not feeling well and felt dizzy with worsening LE edema -blood pressure at that time 98/56.  She does endorse occasional low readings with SBP 90 or less. She is unable to state how long she is out for but once she comes to, denies any type of confusion, fatigue, nausea/vomiting or bladder/bowel incontinence.  She was seen by cardiology 8/15 for evaluation of dyspnea and concern of CHF -discussed syncopal events who felt possibly in setting of polypharmacy (Valium, duloxetine, Lasix, gabapentin, oxycodone) and further eval for syncope not recommended at that time such as with heart monitor as episodes infrequent at that time.  She did have scheduled follow-up today with cardiology but canceled as provider not in office.    Initial visit 05/17/2020 JM: Deborah Peters is being seen for hospital follow-up accompanied by her husband.   Reports residual right arm and hand numbness/tingling which has been gradually improving.  She also reports mild cognitive difficulties which have been present prior to her stroke and slightly worsened with recent stroke She did have episode of left sided numbness which quickly resolved - evaluated in ED by neurology who felt symptom pattern not typical for acute stroke - no additional symptoms since that time Denies new stroke/TIA symptoms  Completed  3 months DAPT and remains on Plavix alone without bleeding or bruising Remains on atorvastatin 80 mg daily without myalgias Blood pressure today 116/67 Continued tobacco use with plans on quitting in the near future  She continues to follow neurosurgery for chronic lower back pain   Stroke  admission 03/17/2020 Deborah Peters is a 66 y.o. female with history of diabetes, hypertension, hyperlipidemia, meralgia paresthetica, heart murmur, and migraines who presented to [facility] ED on 03/17/2020 with 4d hx of R face, arm and leg numbness with difficulty walking.   Personally reviewed hospitalization pertinent progress notes, lab work and imaging with summary provided.  Evaluated by Dr. 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  Recommended DAPT for 3 weeks and Plavix alone as previously on aspirin.  History of HTN stable.  History of HLD on pravastatin 20, Zetia 10 and fish oil -direct LDL 95 with LDL UTC d/t TG 467.  Recommended transitioning to atorvastatin 80 mg daily but per hospital notes, patient tried multiple statins previously with PCP Dr. Abner Greenspan and chose to continue on pravastatin at discharge.  Controlled DM with A1c 5.7.  Other stroke risk factors include prior strokes on imaging, current tobacco use, history of EtOH use and migraines.    Evaluated by therapies and discharged home in stable condition without therapy needs.  Stroke:   L thalamic infarct secondary to small vessel disease   MRI  Subacute L thalamocapsular infarct. Remote L basal ganglia lacune. Small vessel disease.  MRA  73m L paraopthalmic artery aneurysm. Mild atherosclerosis  Carotid Doppler bilateral no carotid stenosis 2D Echo  EF 60 to 65%.  No cardiac source of embolism. LDL UTC d/t TG 467, direct LDL 95.1  HgbA1c 5.7 aspirin 81 mg daily prior to admission, now on aspirin 325 mg daily. Decrease aspirin to 81 and add plavix 75. Continue DAPT x 3 weeks then plavix alone.  Therapy recommendations:   No OT or PT follow-up  Disposition: Home     ROS:   14 system review of systems performed and negative with exception of those listed in HPI  PMH:  Past Medical History:  Diagnosis Date   Allergy    Chronic back pain    Constipation due to opioid therapy     DDD (degenerative disc disease), lumbar    Depression    Diabetes mellitus, type 2 ([place]    Fatty liver    GERD (gastroesophageal reflux disease)    past hx of    Gestational diabetes    no meds as of 06-01-2016.  diet controlled   Heart murmur    Heart murmur    Hyperlipidemia    Hypertension    pt this pain related - id occasionally high    IFG (impaired fasting glucose)    Meralgia paraesthetica    Migraines    Post-menopausal    Scoliosis    Stroke ([facility]    Ulcer    years ago. gastric ulcer.    PSH:  Past Surgical History:  Procedure Laterality Date   ABDOMINAL HYSTERECTOMY     has her ovaries   BACK SURGERY  2011   L5 S1   COLONOSCOPY     JOINT REPLACEMENT     L knee   knee surgeries     x 8 before replacement    POLYPECTOMY     HPP in 2008 DB    Social History:  Social History   Socioeconomic History  Marital status: Married    Spouse name: Not on file   Number of children: 2   Years of education: Not on file   Highest education level: Not on file  Occupational History   Occupation: disabled  Tobacco Use   Smoking status: Former   Smokeless tobacco: Never  Scientific laboratory technician Use: Never used  Substance and Sexual Activity   Alcohol use: Not Currently    Comment: Rare   Drug use: No   Sexual activity: Not on file  Other Topics Concern   Not on file  Social History Narrative   Not on file   Social Determinants of Health   Financial Resource Strain: Not on file  Food Insecurity: Not on file  Transportation Needs: Not on file  Physical Activity: Not on file  Stress: Not on file  Social Connections: Not on file  Intimate Partner Violence: Not on file    Family History:  Family History  Problem Relation Age of Onset   Stroke Mother    Heart failure Father        Triple bypass   Melanoma Father    Colon polyps Sister    Colon polyps Brother    Cancer Maternal Grandmother    Cancer Maternal Grandfather    Stomach cancer Paternal  Grandfather    Esophageal cancer Neg Hx    Rectal cancer Neg Hx    Colon cancer Neg Hx    Pancreatic cancer Neg Hx     Medications:   Current Outpatient Medications on File Prior to Visit  Medication Sig Dispense Refill   ascorbic acid (VITAMIN C) 500 MG tablet Take 500 mg by mouth daily.     atorvastatin (LIPITOR) 80 MG tablet Take 1 tablet (80 mg total) by mouth at bedtime. (Patient taking differently: Take 80 mg by mouth at bedtime. Hold for 4 weeks for Statin holiday - memory loss) 30 tablet 1   benzonatate (TESSALON) 100 MG capsule Take 100 mg by mouth 3 (three) times daily as needed for cough.     clopidogrel (PLAVIX) 75 MG tablet Take 75 mg by mouth daily.     diazepam (VALIUM) 5 MG tablet Take 5 mg by mouth every 8 (eight) hours as needed for anxiety or muscle spasms.     DULoxetine (CYMBALTA) 60 MG capsule Take 60 mg by mouth daily.     ezetimibe (ZETIA) 10 MG tablet Take 10 mg by mouth daily.  3   fish oil-omega-3 fatty acids 1000 MG capsule Take 2 g by mouth 2 (two) times daily.      gabapentin (NEURONTIN) 400 MG capsule Take 400 mg by mouth in the morning, at noon, in the evening, and at bedtime.     losartan (COZAAR) 25 MG tablet Take 50 mg by mouth daily. Try 50 mg daily 05/05/2021     magnesium 30 MG tablet Take 30 mg by mouth 2 (two) times daily.     OVER THE COUNTER MEDICATION Take 1 capsule by mouth daily. Tumeric     OVER THE COUNTER MEDICATION Take 1 tablet by mouth daily. Cinnamon daily     OVER THE COUNTER MEDICATION Take 1 tablet by mouth at bedtime. alteril natural sleep aide at bedtime     Oxycodone HCl 10 MG TABS Take 10 mg by mouth 5 (five) times daily.  0   pantoprazole (PROTONIX) 40 MG tablet Take 1 tablet by mouth as needed.     promethazine (PHENERGAN) 25 MG tablet  Take 25 mg by mouth every 6 (six) hours as needed for nausea or vomiting.     Taurine 1000 MG CAPS Take 1 capsule by mouth in the morning and at bedtime.     zolpidem (AMBIEN CR) 6.25 MG CR tablet  Take 6.25 mg by mouth at bedtime.   5   No current facility-administered medications on file prior to visit.    Allergies:   Allergies  Allergen Reactions   Sulfa Antibiotics Anaphylaxis      OBJECTIVE: There were no vitals filed for this visit. There is no height or weight on file to calculate BMI.   Physical Exam General: well developed, well nourished, pleasant middle-age Caucasian female, seated, in no evident distress Head: head normocephalic and atraumatic.   Neck: supple with no carotid or supraclavicular bruits Cardiovascular: regular rate and rhythm, no murmurs Musculoskeletal: no deformity Skin:  no rash/petichiae Vascular:  Normal pulses all extremities   Neurologic Exam Mental Status: Awake and fully alert. Oriented to place and time. Recent and remote memory intact. Attention span, concentration and fund of knowledge appropriate. Mood and affect appropriate.  Cranial Nerves: Fundoscopic exam reveals sharp disc margins. Pupils equal, briskly reactive to light. Extraocular movements full without nystagmus. Visual fields full to confrontation. Hearing intact. Facial sensation intact. Face, tongue, palate moves normally and symmetrically.  Motor: Normal bulk and tone. Normal strength in all tested extremity muscles Sensory.: intact to touch , pinprick , position and vibratory sensation.  Coordination: Rapid alternating movements normal in all extremities. Finger-to-nose and heel-to-shin performed accurately bilaterally. Gait and Station: Arises from chair without difficulty. Stance is normal. Gait demonstrates normal stride length and balance ***. Tandem walk and heel toe ***.  Reflexes: 1+ and symmetric. Toes downgoing.          ASSESSMENT: Deborah Peters is a 66 y.o. 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  Vascular risk  factors include HTN, HLD, DM, prior stroke on imaging, current tobacco use, history of EtOH use and migraines. Onset of presyncope/syncopal events started around April     PLAN:  Left thalamic stroke:  Residual deficit: RUE intermittent spasticity -referral placed to neuro rehab OT.  Due to being on multiple medications and syncopal issues, would not recommend adding new medication at this time. If not improvement with therapy, may consider referral for Botox Continue clopidogrel 75 mg daily  and atorvastatin 80 mg daily, Zetia 10 mg daily and fish oil for secondary stroke prevention.   Discussed secondary stroke prevention measures and importance of close PCP follow up for aggressive stroke risk factor management including BP goal<130/90, HLD with LDL goal<70 and DM with A1c.<7  Presyncope/syncope: based on reported symptoms, appears to be more cardiac related especially as present since initiating furosemide possibly low blood pressure or cardiac arrhythmia - advised to contact cardiology to further discuss further evaluation  Tobacco use: Discussed importance of complete tobacco cessation.     Follow up in 6 months or call earlier if needed  CC:  Crist Infante, MD   Dr. Terri Skains (cardiology)   I spent 38 minutes of face-to-face and non-face-to-face time with patient via [place] video visit.  This included previsit chart review, lab review, study review, order entry, electronic health record documentation, patient education regarding prior stroke including etiology, residual deficits and onset of spasticity, importance of managing stroke risk factors and  secondary stroke prevention measures, onset of syncopal events and possible etiologies and answered all other questions to patients satisfaction   Frann Rider, [facility]  [facility] [address] [place], [place] [address]  Phone [phone] Fax [phone] Note: This document was prepared with  digital dictation and possible smart phrase technology. Any transcriptional errors that result from this process are unintentional.

## 2021-11-10 DIAGNOSIS — E1169 Type 2 diabetes mellitus with other specified complication: Secondary | ICD-10-CM | POA: Diagnosis not present

## 2021-11-10 DIAGNOSIS — I10 Essential (primary) hypertension: Secondary | ICD-10-CM | POA: Diagnosis not present

## 2021-11-10 DIAGNOSIS — I959 Hypotension, unspecified: Secondary | ICD-10-CM | POA: Diagnosis not present

## 2021-11-10 DIAGNOSIS — R7989 Other specified abnormal findings of blood chemistry: Secondary | ICD-10-CM | POA: Diagnosis not present

## 2021-11-10 DIAGNOSIS — E785 Hyperlipidemia, unspecified: Secondary | ICD-10-CM | POA: Diagnosis not present

## 2021-11-16 ENCOUNTER — Ambulatory Visit
Admission: RE | Admit: 2021-11-16 | Discharge: 2021-11-16 | Disposition: A | Discharge status: Home / Self Care | Payer: PPO | Source: Ambulatory Visit | Attending: Registered Nurse | Admitting: Registered Nurse

## 2021-11-16 ENCOUNTER — Other Ambulatory Visit: Payer: Self-pay | Admitting: Registered Nurse

## 2021-11-16 ENCOUNTER — Emergency Department (HOSPITAL_COMMUNITY): Payer: PPO

## 2021-11-16 ENCOUNTER — Encounter (HOSPITAL_COMMUNITY): Payer: Self-pay

## 2021-11-16 ENCOUNTER — Other Ambulatory Visit: Payer: Self-pay

## 2021-11-16 ENCOUNTER — Emergency Department (HOSPITAL_COMMUNITY)
Admission: EM | Admit: 2021-11-16 | Discharge: 2021-11-16 | Discharge status: Against Medical Advice | Payer: PPO | Attending: Emergency Medicine | Admitting: Emergency Medicine

## 2021-11-16 DIAGNOSIS — R112 Nausea with vomiting, unspecified: Secondary | ICD-10-CM | POA: Diagnosis not present

## 2021-11-16 DIAGNOSIS — Z1152 Encounter for screening for COVID-19: Secondary | ICD-10-CM | POA: Diagnosis not present

## 2021-11-16 DIAGNOSIS — R1011 Right upper quadrant pain: Secondary | ICD-10-CM

## 2021-11-16 DIAGNOSIS — K810 Acute cholecystitis: Secondary | ICD-10-CM | POA: Diagnosis not present

## 2021-11-16 DIAGNOSIS — R509 Fever, unspecified: Secondary | ICD-10-CM | POA: Diagnosis not present

## 2021-11-16 DIAGNOSIS — R110 Nausea: Secondary | ICD-10-CM

## 2021-11-16 DIAGNOSIS — K219 Gastro-esophageal reflux disease without esophagitis: Secondary | ICD-10-CM | POA: Diagnosis not present

## 2021-11-16 DIAGNOSIS — K819 Cholecystitis, unspecified: Secondary | ICD-10-CM | POA: Insufficient documentation

## 2021-11-16 DIAGNOSIS — Z5321 Procedure and treatment not carried out due to patient leaving prior to being seen by health care provider: Secondary | ICD-10-CM | POA: Insufficient documentation

## 2021-11-16 DIAGNOSIS — I10 Essential (primary) hypertension: Secondary | ICD-10-CM | POA: Diagnosis not present

## 2021-11-16 LAB — COMPREHENSIVE METABOLIC PANEL
ALT: 12 U/L (ref 0–44)
AST: 14 U/L — ABNORMAL LOW (ref 15–41)
Albumin: 4.1 g/dL (ref 3.5–5.0)
Alkaline Phosphatase: 64 U/L (ref 38–126)
Anion gap: 10 (ref 5–15)
BUN: 9 mg/dL (ref 8–23)
CO2: 23 mmol/L (ref 22–32)
Calcium: 9.5 mg/dL (ref 8.9–10.3)
Chloride: 105 mmol/L (ref 98–111)
Creatinine, Ser: 0.82 mg/dL (ref 0.44–1.00)
GFR, Estimated: >60 mL/min (ref >60)
Glucose, Bld: 92 mg/dL (ref 70–99)
Potassium: 4.3 mmol/L (ref 3.5–5.1)
Sodium: 138 mmol/L (ref 135–145)
Total Bilirubin: 0.4 mg/dL (ref 0.3–1.2)
Total Protein: 6.6 g/dL (ref 6.5–8.1)

## 2021-11-16 LAB — URINALYSIS, ROUTINE W REFLEX MICROSCOPIC
Bilirubin Urine: NEGATIVE
Glucose, UA: NEGATIVE mg/dL
Hgb urine dipstick: NEGATIVE
Ketones, ur: NEGATIVE mg/dL
Nitrite: NEGATIVE
Protein, ur: NEGATIVE mg/dL
Specific Gravity, Urine: 1.02 (ref 1.005–1.030)
WBC, UA: >50 WBC/hpf — ABNORMAL HIGH (ref 0–5)
pH: 5 (ref 5.0–8.0)

## 2021-11-16 LAB — CBC
HCT: 38.9 % (ref 36.0–46.0)
Hemoglobin: 12.9 g/dL (ref 12.0–15.0)
MCH: 31.9 pg (ref 26.0–34.0)
MCHC: 33.2 g/dL (ref 30.0–36.0)
MCV: 96 fL (ref 80.0–100.0)
Platelets: 252 10*3/uL (ref 150–400)
RBC: 4.05 MIL/uL (ref 3.87–5.11)
RDW: 13.7 % (ref 11.5–15.5)
WBC: 9 10*3/uL (ref 4.0–10.5)
nRBC: 0 % (ref 0.0–0.2)

## 2021-11-16 LAB — LIPASE, BLOOD: Lipase: 26 U/L (ref 11–51)

## 2021-11-16 MED ORDER — ONDANSETRON 8 MG PO TBDP
8.0000 mg | ORAL_TABLET | Freq: Once | ORAL | Status: DC
Start: 2021-11-16 — End: 2021-11-16

## 2021-11-16 NOTE — ED Triage Notes (Signed)
Pt reports RUQ pain that intermittently radiates to back that began 8/3. Pt endorses nausea and fever.

## 2021-11-16 NOTE — ED Notes (Signed)
Pt asked about the wait time while reassessing vitals. Pt was told we can not give out a wait time, but I could tell her how many people were currently ahead of her based on wait time. Pt was advised to not leave, but moments after pt was seen exiting the ED lobby.

## 2021-11-16 NOTE — ED Provider Triage Note (Addendum)
Emergency Medicine Provider Triage Evaluation Note  Deborah Peters , a 66 y.o. female  was evaluated in triage.  Pt complains of right upper pain since 8/3 that worsened on Saturday.  She was evaluated at PCP office today and had a right upper quadrant ultrasound done showing acute cholecystitis.  She denies fever, chills.  Reports nausea but no vomiting.  Review of Systems  Positive: As above Negative: As above  Physical Exam  BP (!) 127/114 (BP Location: Left Arm)   Pulse 86   Temp 98.8 F (37.1 C) (Oral)   Resp 20   SpO2 94%  Gen:   Awake, no distress   Resp:  Normal effort  MSK:   Moves extremities without difficulty  Other:  Generalized abdominal tenderness present  Medical Decision Making  Medically screening exam initiated at 3:13 PM.  Appropriate orders placed.  MRY LAMIA was informed that the remainder of the evaluation will be completed by another provider, this initial triage assessment does not replace that evaluation, and the importance of remaining in the ED until their evaluation is complete.  Attempted to reach to PCP office to have ultrasound report faxed however reached voicemail.  Voicemail left.  Right upper quadrant ultrasound ordered.  1540: Patient able to pull up her right upper quadrant ultrasound report on her phone.  Report demonstrates equivocal signs of cholecystitis.  CBD of 6 mm.  Positive sonographic Murphy sign.  Without evidence of cholelithiasis, or gallbladder wall thickening.   Evlyn Courier, PA-C 11/16/21 1518    Evlyn Courier, PA-C 11/16/21 1615

## 2021-11-17 ENCOUNTER — Encounter (HOSPITAL_BASED_OUTPATIENT_CLINIC_OR_DEPARTMENT_OTHER): Payer: Self-pay

## 2021-11-17 ENCOUNTER — Other Ambulatory Visit (HOSPITAL_COMMUNITY): Payer: Self-pay | Admitting: Internal Medicine

## 2021-11-17 ENCOUNTER — Other Ambulatory Visit: Payer: Self-pay | Admitting: Internal Medicine

## 2021-11-17 ENCOUNTER — Ambulatory Visit (HOSPITAL_BASED_OUTPATIENT_CLINIC_OR_DEPARTMENT_OTHER)
Admission: RE | Admit: 2021-11-17 | Discharge: 2021-11-17 | Disposition: A | Discharge status: Home / Self Care | Payer: PPO | Source: Ambulatory Visit | Attending: Internal Medicine | Admitting: Internal Medicine

## 2021-11-17 DIAGNOSIS — R911 Solitary pulmonary nodule: Secondary | ICD-10-CM | POA: Diagnosis not present

## 2021-11-17 DIAGNOSIS — I700 Atherosclerosis of aorta: Secondary | ICD-10-CM | POA: Diagnosis not present

## 2021-11-17 DIAGNOSIS — Z1339 Encounter for screening examination for other mental health and behavioral disorders: Secondary | ICD-10-CM | POA: Diagnosis not present

## 2021-11-17 DIAGNOSIS — R413 Other amnesia: Secondary | ICD-10-CM | POA: Diagnosis not present

## 2021-11-17 DIAGNOSIS — E785 Hyperlipidemia, unspecified: Secondary | ICD-10-CM | POA: Diagnosis not present

## 2021-11-17 DIAGNOSIS — Z0000 Encounter for general adult medical examination without abnormal findings: Secondary | ICD-10-CM | POA: Diagnosis not present

## 2021-11-17 DIAGNOSIS — K810 Acute cholecystitis: Secondary | ICD-10-CM | POA: Insufficient documentation

## 2021-11-17 DIAGNOSIS — F329 Major depressive disorder, single episode, unspecified: Secondary | ICD-10-CM | POA: Diagnosis not present

## 2021-11-17 DIAGNOSIS — Z1331 Encounter for screening for depression: Secondary | ICD-10-CM | POA: Diagnosis not present

## 2021-11-17 DIAGNOSIS — I639 Cerebral infarction, unspecified: Secondary | ICD-10-CM | POA: Diagnosis not present

## 2021-11-17 DIAGNOSIS — E1169 Type 2 diabetes mellitus with other specified complication: Secondary | ICD-10-CM | POA: Diagnosis not present

## 2021-11-17 DIAGNOSIS — J309 Allergic rhinitis, unspecified: Secondary | ICD-10-CM | POA: Diagnosis not present

## 2021-11-17 DIAGNOSIS — I10 Essential (primary) hypertension: Secondary | ICD-10-CM | POA: Diagnosis not present

## 2021-11-17 DIAGNOSIS — I714 Abdominal aortic aneurysm, without rupture, unspecified: Secondary | ICD-10-CM | POA: Insufficient documentation

## 2021-11-17 DIAGNOSIS — R82998 Other abnormal findings in urine: Secondary | ICD-10-CM | POA: Diagnosis not present

## 2021-11-17 DIAGNOSIS — E278 Other specified disorders of adrenal gland: Secondary | ICD-10-CM | POA: Diagnosis not present

## 2021-11-17 DIAGNOSIS — I2510 Atherosclerotic heart disease of native coronary artery without angina pectoris: Secondary | ICD-10-CM | POA: Diagnosis not present

## 2021-11-17 DIAGNOSIS — R109 Unspecified abdominal pain: Secondary | ICD-10-CM | POA: Diagnosis not present

## 2021-11-17 MED ORDER — IOHEXOL 300 MG/ML  SOLN
100.0000 mL | Freq: Once | INTRAMUSCULAR | Status: AC | PRN
  Administered 2021-11-17: 80 mL via INTRAVENOUS

## 2021-11-18 ENCOUNTER — Other Ambulatory Visit: Payer: Self-pay | Admitting: Internal Medicine

## 2021-11-18 DIAGNOSIS — R911 Solitary pulmonary nodule: Secondary | ICD-10-CM

## 2021-11-22 NOTE — Progress Notes (Unsigned)
[facility] [address] [place]. [place] [address] [phone]       STROKE FOLLOW UP NOTE  [name] Date of Birth:  October 01, 1955 Medical Record Number:  [id number]   Reason for Referral: stroke follow up  Virtual Visit via Video Note  Virtual visit completed through [place], a video enabled telemedicine application. Due to national recommendations of social distancing due to COVID-19, a virtual visit is felt to be most appropriate for this patient at this time. Reviewed limitations, risks, security and privacy concerns of performing a virtual visit and the availability of in person appointments. I also reviewed that there may be a patient responsible charge related to this service. The patient agreed to proceed.    Patient location: home Provider location: in office, [facility] Persons participating in this virtual visit: patient, provider    SUBJECTIVE:   HPI:    Update 11/23/2021 JM: Patient returns for stroke follow-up after prior visit 10 months ago.  She has been stable from a stroke standpoint without any new stroke/TIA symptoms.  Reports continued ***.  On Plavix and atorvastatin.  Blood pressure well controlled.  Closely followed by PCP Dr. Joylene Draft.  She is also currently being followed by pain clinic and cardiology.        History provided for reference purposes only Update 01/11/2021 JM: Returns for overdue stroke follow-up via MyChart video visit after prior visit approximately 8 months ago. She does report right hand, arm and shoulder pain and tightness which has been slowly progressive - can be intermittent or occur in clusters - can be present over a week and other times wont have any pain. Greater difficulty functioning with right hand during increased pain especially as hx of fracture to right pointer finger and 4th digit trigger finger. She questions possible benefit with therapy.  Otherwise, stable from stroke  standpoint without new stroke/TIA symptoms.  Reports compliance on Plavix and atorvastatin without side effects.  Routinely follows with PCP.  Also reports syncopal episodes which initially started back in April and then subsided and restarted around July/August. Of note, she was started on furosemide back in April for lower extremity swelling.  She has remained on furosemide 40 mg daily.  Reports syncopal event last week and prior to that 7 to 10 days ago.  Episodes usually occur when going from sitting to standing or when walking which leads to falls and has hit her head on 2 of her falls but did not experience any type of headache or neurological symptoms. At times, may not have much of a warning but other times will experience lightheadedness/dizziness sensation, generally not feeling well and physically not stable.  At times she can feel the symptoms which do not progress to passing out.  Fluctuation of blood pressure readings with [place] weeks average 110/60 and on 10/16, she was not feeling well and felt dizzy with worsening LE edema -blood pressure at that time 98/56.  She does endorse occasional low readings with SBP 90 or less. She is unable to state how long she is out for but once she comes to, denies any type of confusion, fatigue, nausea/vomiting or bladder/bowel incontinence.  She was seen by cardiology 8/15 for evaluation of dyspnea and concern of CHF -discussed syncopal events who felt possibly in setting of polypharmacy (Valium, duloxetine, Lasix, gabapentin, oxycodone) and further eval for syncope not recommended at that time such as with heart monitor as episodes infrequent at that time.  She did have  scheduled follow-up today with cardiology but canceled as provider not in office.   Initial visit 05/17/2020 JM: Deborah Peters is being seen for hospital follow-up accompanied by her husband.   Reports residual right arm and hand numbness/tingling which has been gradually improving.  She also reports  mild cognitive difficulties which have been present prior to her stroke and slightly worsened with recent stroke She did have episode of left sided numbness which quickly resolved - evaluated in ED by neurology who felt symptom pattern not typical for acute stroke - no additional symptoms since that time Denies new stroke/TIA symptoms  Completed 3 months DAPT and remains on Plavix alone without bleeding or bruising Remains on atorvastatin 80 mg daily without myalgias Blood pressure today 116/67 Continued tobacco use with plans on quitting in the near future  She continues to follow neurosurgery for chronic lower back pain   Stroke admission 03/17/2020 [name] is a 66 y.o. female with history of diabetes, hypertension, hyperlipidemia, meralgia paresthetica, heart murmur, and migraines who presented to [facility] ED on 03/17/2020 with 4d hx of R face, arm and leg numbness with difficulty walking.   Personally reviewed hospitalization pertinent progress notes, lab work and imaging with summary provided.  Evaluated by Dr. 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  Recommended DAPT for 3 weeks and Plavix alone as previously on aspirin.  History of HTN stable.  History of HLD on pravastatin 20, Zetia 10 and fish oil -direct LDL 95 with LDL UTC d/t TG 467.  Recommended transitioning to atorvastatin 80 mg daily but per hospital notes, patient tried multiple statins previously with PCP Dr. Abner Greenspan and chose to continue on pravastatin at discharge.  Controlled DM with A1c 5.7.  Other stroke risk factors include prior strokes on imaging, current tobacco use, history of EtOH use and migraines.    Evaluated by therapies and discharged home in stable condition without therapy needs.  Stroke:   L thalamic infarct secondary to small vessel disease   MRI  Subacute L thalamocapsular infarct. Remote L basal ganglia lacune. Small vessel disease.  MRA  56m L  paraopthalmic artery aneurysm. Mild atherosclerosis  Carotid Doppler bilateral no carotid stenosis 2D Echo  EF 60 to 65%.  No cardiac source of embolism. LDL UTC d/t TG 467, direct LDL 95.1  HgbA1c 5.7 aspirin 81 mg daily prior to admission, now on aspirin 325 mg daily. Decrease aspirin to 81 and add plavix 75. Continue DAPT x 3 weeks then plavix alone.  Therapy recommendations:   No OT or PT follow-up  Disposition: Home     ROS:   14 system review of systems performed and negative with exception of those listed in HPI  PMH:  Past Medical History:  Diagnosis Date   Allergy    Chronic back pain    Constipation due to opioid therapy    DDD (degenerative disc disease), lumbar    Depression    Diabetes mellitus, type 2 ([place]    Fatty liver    GERD (gastroesophageal reflux disease)    past hx of    Gestational diabetes    no meds as of 06-01-2016.  diet controlled   Heart murmur    Heart murmur    Hyperlipidemia    Hypertension    pt this pain related - id occasionally high    IFG (impaired fasting glucose)    Meralgia paraesthetica    Migraines    Post-menopausal  Scoliosis    Stroke ([place])    Ulcer    years ago. gastric ulcer.    PSH:  Past Surgical History:  Procedure Laterality Date   ABDOMINAL HYSTERECTOMY     has her ovaries   BACK SURGERY  2011   L5 S1   COLONOSCOPY     JOINT REPLACEMENT     L knee   knee surgeries     x 8 before replacement    POLYPECTOMY     HPP in 2008 DB    Social History:  Social History   Socioeconomic History   Marital status: Married    Spouse name: Not on file   Number of children: 2   Years of education: Not on file   Highest education level: Not on file  Occupational History   Occupation: disabled  Tobacco Use   Smoking status: Former   Smokeless tobacco: Never  Scientific laboratory technician Use: Never used  Substance and Sexual Activity   Alcohol use: Not Currently    Comment: Rare   Drug use: No   Sexual activity:  Not on file  Other Topics Concern   Not on file  Social History Narrative   Not on file   Social Determinants of Health   Financial Resource Strain: Not on file  Food Insecurity: Not on file  Transportation Needs: Not on file  Physical Activity: Not on file  Stress: Not on file  Social Connections: Not on file  Intimate Partner Violence: Not on file    Family History:  Family History  Problem Relation Age of Onset   Stroke Mother    Heart failure Father        Triple bypass   Melanoma Father    Colon polyps Sister    Colon polyps Brother    Cancer Maternal Grandmother    Cancer Maternal Grandfather    Stomach cancer Paternal Grandfather    Esophageal cancer Neg Hx    Rectal cancer Neg Hx    Colon cancer Neg Hx    Pancreatic cancer Neg Hx     Medications:   Current Outpatient Medications on File Prior to Visit  Medication Sig Dispense Refill   ascorbic acid (VITAMIN C) 500 MG tablet Take 500 mg by mouth daily.     atorvastatin (LIPITOR) 80 MG tablet Take 1 tablet (80 mg total) by mouth at bedtime. (Patient taking differently: Take 80 mg by mouth at bedtime. Hold for 4 weeks for Statin holiday - memory loss) 30 tablet 1   benzonatate (TESSALON) 100 MG capsule Take 100 mg by mouth 3 (three) times daily as needed for cough.     clopidogrel (PLAVIX) 75 MG tablet Take 75 mg by mouth daily.     diazepam (VALIUM) 5 MG tablet Take 5 mg by mouth every 8 (eight) hours as needed for anxiety or muscle spasms.     DULoxetine (CYMBALTA) 60 MG capsule Take 60 mg by mouth daily.     ezetimibe (ZETIA) 10 MG tablet Take 10 mg by mouth daily.  3   fish oil-omega-3 fatty acids 1000 MG capsule Take 2 g by mouth 2 (two) times daily.      gabapentin (NEURONTIN) 400 MG capsule Take 400 mg by mouth in the morning, at noon, in the evening, and at bedtime.     losartan (COZAAR) 25 MG tablet Take 50 mg by mouth daily. Try 50 mg daily 05/05/2021     magnesium 30 MG tablet Take  30 mg by mouth 2 (two)  times daily.     OVER THE COUNTER MEDICATION Take 1 capsule by mouth daily. Tumeric     OVER THE COUNTER MEDICATION Take 1 tablet by mouth daily. Cinnamon daily     OVER THE COUNTER MEDICATION Take 1 tablet by mouth at bedtime. alteril natural sleep aide at bedtime     Oxycodone HCl 10 MG TABS Take 10 mg by mouth 5 (five) times daily.  0   pantoprazole (PROTONIX) 40 MG tablet Take 1 tablet by mouth as needed.     promethazine (PHENERGAN) 25 MG tablet Take 25 mg by mouth every 6 (six) hours as needed for nausea or vomiting.     Taurine 1000 MG CAPS Take 1 capsule by mouth in the morning and at bedtime.     zolpidem (AMBIEN CR) 6.25 MG CR tablet Take 6.25 mg by mouth at bedtime.   5   No current facility-administered medications on file prior to visit.    Allergies:   Allergies  Allergen Reactions   Sulfa Antibiotics Anaphylaxis      OBJECTIVE:  Physical Exam Limited due to visit type General: well developed, well nourished,  pleasant middle-age Caucasian female, seated, in no evident distress  Neurologic Exam Mental Status: Awake and fully alert.   Fluent speech and language.  Oriented to place and time. Recent memory subjectively impaired and remote memory intact. Attention span, concentration and fund of knowledge appropriate. Mood and affect appropriate.  Motor: No evidence of pronator drift Coordination: Rapid alternating movements normal in all extremities. Finger-to-nose and heel-to-shin performed accurately bilaterally.       ASSESSMENT: JILLIANE KAZANJIAN is a 66 y.o. 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  Vascular risk factors include HTN, HLD, DM, prior stroke on imaging, current tobacco use, history of EtOH use and migraines. Onset of presyncope/syncopal events started around April     PLAN:  Left thalamic stroke:  Residual  deficit: RUE intermittent spasticity -referral placed to neuro rehab OT.  Due to being on multiple medications and syncopal issues, would not recommend adding new medication at this time. If not improvement with therapy, may consider referral for Botox Continue clopidogrel 75 mg daily  and atorvastatin 80 mg daily, Zetia 10 mg daily and fish oil for secondary stroke prevention.   Discussed secondary stroke prevention measures and importance of close PCP follow up for aggressive stroke risk factor management including BP goal<130/90, HLD with LDL goal<70 and DM with A1c.<7  Presyncope/syncope: based on reported symptoms, appears to be more cardiac related especially as present since initiating furosemide possibly low blood pressure or cardiac arrhythmia - advised to contact cardiology to further discuss further evaluation  Tobacco use: Discussed importance of complete tobacco cessation.     Follow up in 6 months or call earlier if needed  CC:  [facility] provider: Dr. Mont Dutton, MD   Dr. Terri Skains (cardiology)   I spent 38 minutes of face-to-face and non-face-to-face time with patient via [place] video visit.  This included previsit chart review, lab review, study review, order entry, electronic health record documentation, patient education regarding prior stroke including etiology, residual deficits and onset of spasticity, importance of managing stroke risk factors and secondary stroke prevention measures, onset of syncopal events and possible etiologies and answered all other questions to patients satisfaction   Frann Rider,  AGNP-BC  [facility] [address] [place], [place] [address]  Phone [phone] Fax [phone] Note: This document was prepared with digital dictation and possible smart phrase technology. Any transcriptional errors that result from this process are unintentional.

## 2021-11-23 ENCOUNTER — Ambulatory Visit: Payer: PPO | Admitting: Adult Health

## 2021-11-23 ENCOUNTER — Encounter: Payer: Self-pay | Admitting: Adult Health

## 2021-11-23 VITALS — BP 115/54 | HR 77 | Ht 66.0 in | Wt 149.0 lb

## 2021-11-23 DIAGNOSIS — I6381 Other cerebral infarction due to occlusion or stenosis of small artery: Secondary | ICD-10-CM

## 2021-11-23 NOTE — Patient Instructions (Addendum)
Continue clopidogrel 75 mg daily  and atorvastatin (Lipitor)  for secondary stroke prevention  Continue to follow up with PCP regarding cholesterol and blood pressure management  Maintain strict control of hypertension with blood pressure goal below 130/90 and cholesterol with LDL cholesterol (bad cholesterol) goal below 70 mg/dL.   Signs of a Stroke? Follow the BEFAST method:  Balance Watch for a sudden loss of balance, trouble with coordination or vertigo Eyes Is there a sudden loss of vision in one or both eyes? Or double vision?  Face: Ask the person to smile. Does one side of the face droop or is it numb?  Arms: Ask the person to raise both arms. Does one arm drift downward? Is there weakness or numbness of a leg? Speech: Ask the person to repeat a simple phrase. Does the speech sound slurred/strange? Is the person confused ? Time: If you observe any of these signs, call 911.        Thank you for coming to see [place] at [facility]. I hope we have been able to provide you high quality care today.  You may receive a patient satisfaction survey over the next few weeks. We would appreciate your feedback and comments so that we may continue to improve ourselves and the health of our patients.

## 2021-11-24 ENCOUNTER — Encounter: Payer: PPO | Admitting: Surgery

## 2021-11-29 ENCOUNTER — Ambulatory Visit: Payer: Self-pay | Admitting: General Surgery

## 2021-11-29 DIAGNOSIS — K8020 Calculus of gallbladder without cholecystitis without obstruction: Secondary | ICD-10-CM | POA: Diagnosis not present

## 2021-11-29 NOTE — H&P (Signed)
Chief Complaint: New Consultation (Gallbladder )       History of Present Illness: Deborah Peters is a 66 y.o. female who is seen today as an office consultation at the request of Dr. Joylene Draft for evaluation of New Consultation (Gallbladder ) .     Patient is a 66 year old female, who comes in secondary to right upper quadrant pain.  Patient states that this been going on since beginning of August.  States this has been right upper quadrant/epigastric does radiate to the back as well as the shoulder.  States that she is felt bloated.  She had some clay colored stools and orange urine.  Patient did undergo CT scan and ultrasound.  Reviewed both these personally.  She does have some sludge as well as had a positive Murphy sign.  She does state that she had some nausea vomiting, fevers at home.   Patient's laboratory studies were within normal limits.   Patient had previous hysterectomy in the past.     Review of Systems: A complete review of systems was obtained from the patient.  I have reviewed this information and discussed as appropriate with the patient.  See HPI as well for other ROS.   Review of Systems  Constitutional:  Positive for fever.  HENT:  Negative for congestion.   Eyes:  Negative for blurred vision.  Respiratory:  Negative for cough, shortness of breath and wheezing.   Cardiovascular:  Negative for chest pain and palpitations.  Gastrointestinal:  Positive for abdominal pain, nausea and vomiting. Negative for heartburn.  Genitourinary:  Negative for dysuria.  Musculoskeletal:  Negative for myalgias.  Skin:  Negative for rash.  Neurological:  Negative for dizziness and headaches.  Psychiatric/Behavioral:  Negative for depression and suicidal ideas.   All other systems reviewed and are negative.      Medical History: Past Medical History Past Medical History: Diagnosis Date  Anxiety    Arthritis    GERD (gastroesophageal reflux disease)    History of stroke     Hypertension        There is no problem list on file for this patient.     Past Surgical History Past Surgical History: Procedure Laterality Date  JOINT REPLACEMENT Left 2006   knee  HYSTERECTOMY   2007  Left Ear       early 2000s      Allergies Allergies Allergen Reactions  Sulfa (Sulfonamide Antibiotics) Anaphylaxis and Other (See Comments)  Amoxicillin-Pot Clavulanate Other (See Comments)  Atorvastatin Other (See Comments)  Ephedrine-Guaifenesin Other (See Comments)      Current Outpatient Medications on File Prior to Visit Medication Sig Dispense Refill  atorvastatin (LIPITOR) 80 MG tablet TAKE 1 TABLET BY MOUTH nightly AT BEDTIME      benzonatate (TESSALON) 100 MG capsule TAKE 1 CAPSULE BY MOUTH 3 TIMES DAILY AS NEEDED FOR cough      diazePAM (VALIUM) 5 MG tablet TAKE 1 TABLET BY MOUTH UPTO 3 TIMES DAILY AS NEEDED FOR MUSCLE SPASMS AS DIRECTED      donepeziL (ARICEPT) 5 MG tablet Take 5 mg by mouth at bedtime      gabapentin (NEURONTIN) 400 MG capsule TAKE 1 CAPSULE BY MOUTH EVERY MORNING, 1 CAPSULE IN THE EVENING, AND 2 CAPSULES AT NIGHT AS DIRECTED      morphine (MSIR) 15 MG immediate release tablet Take by mouth      ascorbic acid, vitamin C, (VITAMIN C) 500 MG tablet Take by mouth      clopidogreL (PLAVIX)  75 mg tablet Take 1 tablet by mouth once daily      coQ10, ubiquinol, 100 mg Cap        DULoxetine (CYMBALTA) 60 MG DR capsule Take 1 capsule by mouth once daily      folic acid (FOLVITE) 1 MG tablet Take by mouth      losartan (COZAAR) 50 MG tablet Take 1 tablet by mouth every evening      magnesium gluconate 30 mg (550 mg) Tab Take 1 tablet by mouth      omega-3-dha-epa-fish oil 300-1,000 mg capsule Take 2 g by mouth 2 (two) times daily      pantoprazole (PROTONIX) 40 MG DR tablet Take 1 tablet by mouth as needed      zolpidem (AMBIEN) 5 MG tablet Take by mouth       No current facility-administered medications on file prior to visit.     Family  History History reviewed. No pertinent family history.     Social History   Tobacco Use Smoking Status Some Days  Types: Cigarettes Smokeless Tobacco Never     Social History Social History    Socioeconomic History  Marital status: Married Tobacco Use  Smoking status: Some Days     Types: Cigarettes  Smokeless tobacco: Never Substance and Sexual Activity  Alcohol use: Not Currently  Drug use: Never      Objective:     Vitals:   11/29/21 1003 BP: 120/80 Pulse: 97 Temp: 36.2 C (97.1 F) SpO2: 98% Weight: 67.3 kg (148 lb 6.4 oz) Height: 166.4 cm (5' 5.5")   Body mass index is 24.32 kg/m.   Physical Exam Constitutional:      Appearance: Normal appearance.  HENT:     Head: Normocephalic and atraumatic.     Mouth/Throat:     Mouth: Mucous membranes are moist.     Pharynx: Oropharynx is clear.  Eyes:     General: No scleral icterus.    Pupils: Pupils are equal, round, and reactive to light.  Cardiovascular:     Rate and Rhythm: Normal rate and regular rhythm.     Pulses: Normal pulses.     Heart sounds: No murmur heard.   No friction rub. No gallop.  Pulmonary:     Effort: Pulmonary effort is normal. No respiratory distress.     Breath sounds: Normal breath sounds. No stridor.  Abdominal:     General: Abdomen is flat.  Musculoskeletal:        General: No swelling.  Skin:    General: Skin is warm.  Neurological:     General: No focal deficit present.     Mental Status: She is alert and oriented to person, place, and time. Mental status is at baseline.  Psychiatric:        Mood and Affect: Mood normal.        Thought Content: Thought content normal.        Judgment: Judgment normal.      Assessment and Plan: Diagnoses and all orders for this visit:   Symptomatic cholelithiasis     [name] is a 66 y.o. female    1.  We will proceed to the OR for a lap cholecystectomy. 2. All risks and benefits were discussed with the patient to  generally include: infection, bleeding, possible need for post op ERCP, damage to the bile ducts, and bile leak. Alternatives were offered and described.  All questions were answered and the patient voiced understanding of the procedure  and wishes to proceed at this point with a laparoscopic cholecystectomy           No follow-ups on file.   Ralene Ok, MD, [facility], [facility]

## 2021-11-29 NOTE — H&P (View-Only) (Signed)
Chief Complaint: New Consultation (Gallbladder )       History of Present Illness: Deborah Peters is a 66 y.o. female who is seen today as an office consultation at the request of Dr. Joylene Draft for evaluation of New Consultation (Gallbladder ) .     Patient is a 67 year old female, who comes in secondary to right upper quadrant pain.  Patient states that this been going on since beginning of August.  States this has been right upper quadrant/epigastric does radiate to the back as well as the shoulder.  States that she is felt bloated.  She had some clay colored stools and orange urine.  Patient did undergo CT scan and ultrasound.  Reviewed both these personally.  She does have some sludge as well as had a positive Murphy sign.  She does state that she had some nausea vomiting, fevers at home.   Patient's laboratory studies were within normal limits.   Patient had previous hysterectomy in the past.     Review of Systems: A complete review of systems was obtained from the patient.  I have reviewed this information and discussed as appropriate with the patient.  See HPI as well for other ROS.   Review of Systems  Constitutional:  Positive for fever.  HENT:  Negative for congestion.   Eyes:  Negative for blurred vision.  Respiratory:  Negative for cough, shortness of breath and wheezing.   Cardiovascular:  Negative for chest pain and palpitations.  Gastrointestinal:  Positive for abdominal pain, nausea and vomiting. Negative for heartburn.  Genitourinary:  Negative for dysuria.  Musculoskeletal:  Negative for myalgias.  Skin:  Negative for rash.  Neurological:  Negative for dizziness and headaches.  Psychiatric/Behavioral:  Negative for depression and suicidal ideas.   All other systems reviewed and are negative.      Medical History: Past Medical History Past Medical History: Diagnosis Date  Anxiety    Arthritis    GERD (gastroesophageal reflux disease)    History of stroke     Hypertension        There is no problem list on file for this patient.     Past Surgical History Past Surgical History: Procedure Laterality Date  JOINT REPLACEMENT Left 2006   knee  HYSTERECTOMY   2007  Left Ear       early 2000s      Allergies Allergies Allergen Reactions  Sulfa (Sulfonamide Antibiotics) Anaphylaxis and Other (See Comments)  Amoxicillin-Pot Clavulanate Other (See Comments)  Atorvastatin Other (See Comments)  Ephedrine-Guaifenesin Other (See Comments)      Current Outpatient Medications on File Prior to Visit Medication Sig Dispense Refill  atorvastatin (LIPITOR) 80 MG tablet TAKE 1 TABLET BY MOUTH nightly AT BEDTIME      benzonatate (TESSALON) 100 MG capsule TAKE 1 CAPSULE BY MOUTH 3 TIMES DAILY AS NEEDED FOR cough      diazePAM (VALIUM) 5 MG tablet TAKE 1 TABLET BY MOUTH UPTO 3 TIMES DAILY AS NEEDED FOR MUSCLE SPASMS AS DIRECTED      donepeziL (ARICEPT) 5 MG tablet Take 5 mg by mouth at bedtime      gabapentin (NEURONTIN) 400 MG capsule TAKE 1 CAPSULE BY MOUTH EVERY MORNING, 1 CAPSULE IN THE EVENING, AND 2 CAPSULES AT NIGHT AS DIRECTED      morphine (MSIR) 15 MG immediate release tablet Take by mouth      ascorbic acid, vitamin C, (VITAMIN C) 500 MG tablet Take by mouth      clopidogreL (PLAVIX)  75 mg tablet Take 1 tablet by mouth once daily      coQ10, ubiquinol, 100 mg Cap        DULoxetine (CYMBALTA) 60 MG DR capsule Take 1 capsule by mouth once daily      folic acid (FOLVITE) 1 MG tablet Take by mouth      losartan (COZAAR) 50 MG tablet Take 1 tablet by mouth every evening      magnesium gluconate 30 mg (550 mg) Tab Take 1 tablet by mouth      omega-3-dha-epa-fish oil 300-1,000 mg capsule Take 2 g by mouth 2 (two) times daily      pantoprazole (PROTONIX) 40 MG DR tablet Take 1 tablet by mouth as needed      zolpidem (AMBIEN) 5 MG tablet Take by mouth       No current facility-administered medications on file prior to visit.     Family  History History reviewed. No pertinent family history.     Social History   Tobacco Use Smoking Status Some Days  Types: Cigarettes Smokeless Tobacco Never     Social History Social History    Socioeconomic History  Marital status: Married Tobacco Use  Smoking status: Some Days     Types: Cigarettes  Smokeless tobacco: Never Substance and Sexual Activity  Alcohol use: Not Currently  Drug use: Never      Objective:     Vitals:   11/29/21 1003 BP: 120/80 Pulse: 97 Temp: 36.2 C (97.1 F) SpO2: 98% Weight: 67.3 kg (148 lb 6.4 oz) Height: 166.4 cm (5' 5.5")   Body mass index is 24.32 kg/m.   Physical Exam Constitutional:      Appearance: Normal appearance.  HENT:     Head: Normocephalic and atraumatic.     Mouth/Throat:     Mouth: Mucous membranes are moist.     Pharynx: Oropharynx is clear.  Eyes:     General: No scleral icterus.    Pupils: Pupils are equal, round, and reactive to light.  Cardiovascular:     Rate and Rhythm: Normal rate and regular rhythm.     Pulses: Normal pulses.     Heart sounds: No murmur heard.   No friction rub. No gallop.  Pulmonary:     Effort: Pulmonary effort is normal. No respiratory distress.     Breath sounds: Normal breath sounds. No stridor.  Abdominal:     General: Abdomen is flat.  Musculoskeletal:        General: No swelling.  Skin:    General: Skin is warm.  Neurological:     General: No focal deficit present.     Mental Status: She is alert and oriented to person, place, and time. Mental status is at baseline.  Psychiatric:        Mood and Affect: Mood normal.        Thought Content: Thought content normal.        Judgment: Judgment normal.      Assessment and Plan: Diagnoses and all orders for this visit:   Symptomatic cholelithiasis     Deborah Peters is a 66 y.o. female    1.  We will proceed to the OR for a lap cholecystectomy. 2. All risks and benefits were discussed with the patient to  generally include: infection, bleeding, possible need for post op ERCP, damage to the bile ducts, and bile leak. Alternatives were offered and described.  All questions were answered and the patient voiced understanding of the procedure  and wishes to proceed at this point with a laparoscopic cholecystectomy           No follow-ups on file.   Ralene Ok, MD, [facility], [facility]

## 2021-12-01 DIAGNOSIS — Z9189 Other specified personal risk factors, not elsewhere classified: Secondary | ICD-10-CM | POA: Diagnosis not present

## 2021-12-01 DIAGNOSIS — N9089 Other specified noninflammatory disorders of vulva and perineum: Secondary | ICD-10-CM | POA: Diagnosis not present

## 2021-12-01 DIAGNOSIS — N898 Other specified noninflammatory disorders of vagina: Secondary | ICD-10-CM | POA: Diagnosis not present

## 2021-12-01 DIAGNOSIS — Z8619 Personal history of other infectious and parasitic diseases: Secondary | ICD-10-CM | POA: Diagnosis not present

## 2021-12-01 DIAGNOSIS — Z01419 Encounter for gynecological examination (general) (routine) without abnormal findings: Secondary | ICD-10-CM | POA: Diagnosis not present

## 2021-12-02 ENCOUNTER — Other Ambulatory Visit: Payer: Self-pay

## 2021-12-02 ENCOUNTER — Encounter (HOSPITAL_COMMUNITY): Payer: Self-pay | Admitting: General Surgery

## 2021-12-02 NOTE — Progress Notes (Addendum)
Spoke with pt for pre-op call. Pt denies cardiac history, but has had 2 strokes in the past. Pt is on Plavix, she was told to stop 5 days prior to surgery by Dr. Joylene Draft, her last dose was 11/29/21. Pt states she is diabetic, but has never taken medication for it. Last A1C in Epic was 5.7 on 03/18/20. Pt states she thought she had one done recently by Dr. Joylene Draft but it is not listed. She states she has never had to check her blood sugar at home.  Shower instructions given to pt and she voiced understanding.

## 2021-12-04 NOTE — Anesthesia Preprocedure Evaluation (Signed)
Anesthesia Evaluation  Patient identified by MRN, date of birth, ID band Patient awake    Reviewed: Allergy & Precautions, NPO status , Patient's Chart, lab work & pertinent test results  Airway Mallampati: III  TM Distance: >3 FB Neck ROM: Full    Dental  (+) Chipped   Pulmonary Current Smoker and Patient abstained from smoking.,    Pulmonary exam normal        Cardiovascular hypertension, Pt. on medications Normal cardiovascular exam  TTE 2022 1. Left ventricular ejection fraction, by estimation, is 65 to 70%. The  left ventricle has normal function. There is mild left ventricular  hypertrophy. Left ventricular diastolic parameters were normal.  2. Right ventricular systolic function is normal. The right ventricular  size is normal.  3. The mitral valve is normal in structure. Trivial mitral valve  regurgitation.  4. The aortic valve is normal in structure. Aortic valve regurgitation is  not visualized.    Neuro/Psych  Headaches, PSYCHIATRIC DISORDERS Anxiety Depression CVA, Residual Symptoms    GI/Hepatic GERD  Medicated and Controlled,(+)     substance abuse  ,   Endo/Other  diabetes, Type 2  Renal/GU negative Renal ROS  negative genitourinary   Musculoskeletal  (+) Arthritis , narcotic dependent  Abdominal   Peds  Hematology  (+) Blood dyscrasia (on plavix), ,   Anesthesia Other Findings Morphine '15mg'$   Reproductive/Obstetrics                            Anesthesia Physical Anesthesia Plan  ASA: 3  Anesthesia Plan: General   Post-op Pain Management: Tylenol PO (pre-op)* and Ketamine IV*   Induction: Intravenous  PONV Risk Score and Plan: 2 and Midazolam, Dexamethasone, Ondansetron, Propofol infusion and Treatment may vary due to age or medical condition  Airway Management Planned: Oral ETT  Additional Equipment:   Intra-op Plan:   Post-operative Plan: Extubation in  OR  Informed Consent: I have reviewed the patients History and Physical, chart, labs and discussed the procedure including the risks, benefits and alternatives for the proposed anesthesia with the patient or authorized representative who has indicated his/her understanding and acceptance.     Dental advisory given  Plan Discussed with: CRNA  Anesthesia Plan Comments:        Anesthesia Quick Evaluation

## 2021-12-05 ENCOUNTER — Other Ambulatory Visit: Payer: Self-pay

## 2021-12-05 ENCOUNTER — Ambulatory Visit (HOSPITAL_COMMUNITY): Payer: PPO

## 2021-12-05 ENCOUNTER — Encounter (HOSPITAL_COMMUNITY): Payer: Self-pay | Admitting: General Surgery

## 2021-12-05 ENCOUNTER — Ambulatory Visit (HOSPITAL_COMMUNITY)
Admission: RE | Admit: 2021-12-05 | Discharge: 2021-12-05 | Disposition: A | Discharge status: Home / Self Care | Payer: PPO | Source: Ambulatory Visit | Attending: General Surgery | Admitting: General Surgery

## 2021-12-05 ENCOUNTER — Encounter (HOSPITAL_COMMUNITY)
Admission: RE | Disposition: A | Discharge status: Home / Self Care | Payer: Self-pay | Source: Ambulatory Visit | Attending: General Surgery

## 2021-12-05 ENCOUNTER — Ambulatory Visit (HOSPITAL_BASED_OUTPATIENT_CLINIC_OR_DEPARTMENT_OTHER): Payer: PPO

## 2021-12-05 DIAGNOSIS — E119 Type 2 diabetes mellitus without complications: Secondary | ICD-10-CM | POA: Insufficient documentation

## 2021-12-05 DIAGNOSIS — F418 Other specified anxiety disorders: Secondary | ICD-10-CM | POA: Insufficient documentation

## 2021-12-05 DIAGNOSIS — K8020 Calculus of gallbladder without cholecystitis without obstruction: Secondary | ICD-10-CM

## 2021-12-05 DIAGNOSIS — F17210 Nicotine dependence, cigarettes, uncomplicated: Secondary | ICD-10-CM

## 2021-12-05 DIAGNOSIS — I10 Essential (primary) hypertension: Secondary | ICD-10-CM | POA: Diagnosis not present

## 2021-12-05 DIAGNOSIS — K811 Chronic cholecystitis: Secondary | ICD-10-CM | POA: Diagnosis not present

## 2021-12-05 DIAGNOSIS — K219 Gastro-esophageal reflux disease without esophagitis: Secondary | ICD-10-CM | POA: Diagnosis not present

## 2021-12-05 DIAGNOSIS — Z79899 Other long term (current) drug therapy: Secondary | ICD-10-CM | POA: Diagnosis not present

## 2021-12-05 DIAGNOSIS — F1120 Opioid dependence, uncomplicated: Secondary | ICD-10-CM | POA: Insufficient documentation

## 2021-12-05 DIAGNOSIS — F17200 Nicotine dependence, unspecified, uncomplicated: Secondary | ICD-10-CM | POA: Diagnosis not present

## 2021-12-05 DIAGNOSIS — K8010 Calculus of gallbladder with chronic cholecystitis without obstruction: Secondary | ICD-10-CM | POA: Diagnosis not present

## 2021-12-05 HISTORY — PX: CHOLECYSTECTOMY: SHX55

## 2021-12-05 HISTORY — DX: Pneumonia, unspecified organism: J18.9

## 2021-12-05 HISTORY — DX: Anxiety disorder, unspecified: F41.9

## 2021-12-05 HISTORY — DX: COVID-19: U07.1

## 2021-12-05 LAB — GLUCOSE, CAPILLARY: Glucose-Capillary: 120 mg/dL — ABNORMAL HIGH (ref 70–99)

## 2021-12-05 SURGERY — LAPAROSCOPIC CHOLECYSTECTOMY
Anesthesia: General | Site: Abdomen

## 2021-12-05 MED ORDER — ORAL CARE MOUTH RINSE: 15.0000 mL | Freq: Once | OROMUCOSAL | Status: AC

## 2021-12-05 MED ORDER — ACETAMINOPHEN 10 MG/ML IV SOLN
1000.0000 mg | Freq: Once | INTRAVENOUS | Status: AC
  Administered 2021-12-05: 1000 mg via INTRAVENOUS

## 2021-12-05 MED ORDER — KETOROLAC TROMETHAMINE 30 MG/ML IJ SOLN: 15.0000 mg | Freq: Once | INTRAMUSCULAR | Status: DC

## 2021-12-05 MED ORDER — FENTANYL CITRATE (PF) 100 MCG/2ML IJ SOLN
INTRAMUSCULAR | Status: AC
  Filled 2021-12-05: qty 2

## 2021-12-05 MED ORDER — DEXAMETHASONE SODIUM PHOSPHATE 10 MG/ML IJ SOLN
INTRAMUSCULAR | Status: DC | PRN
  Administered 2021-12-05: 8 mg via INTRAVENOUS

## 2021-12-05 MED ORDER — PROPOFOL 10 MG/ML IV BOLUS
INTRAVENOUS | Status: AC
  Filled 2021-12-05: qty 20

## 2021-12-05 MED ORDER — CHLORHEXIDINE GLUCONATE CLOTH 2 % EX PADS: 6.0000 | MEDICATED_PAD | Freq: Once | CUTANEOUS | Status: DC

## 2021-12-05 MED ORDER — ROCURONIUM BROMIDE 10 MG/ML (PF) SYRINGE
PREFILLED_SYRINGE | INTRAVENOUS | Status: AC
Start: 2021-12-05 — End: ?
  Filled 2021-12-05: qty 10

## 2021-12-05 MED ORDER — MIDAZOLAM HCL 2 MG/2ML IJ SOLN
INTRAMUSCULAR | Status: AC
  Filled 2021-12-05: qty 2

## 2021-12-05 MED ORDER — ACETAMINOPHEN 500 MG PO TABS
1000.0000 mg | ORAL_TABLET | Freq: Once | ORAL | Status: DC
  Filled 2021-12-05: qty 2

## 2021-12-05 MED ORDER — PROPOFOL 500 MG/50ML IV EMUL
INTRAVENOUS | Status: DC | PRN
  Administered 2021-12-05: 25 ug/kg/min via INTRAVENOUS

## 2021-12-05 MED ORDER — ONDANSETRON HCL 4 MG/2ML IJ SOLN
INTRAMUSCULAR | Status: AC
  Filled 2021-12-05: qty 2

## 2021-12-05 MED ORDER — BUPIVACAINE HCL (PF) 0.25 % IJ SOLN
INTRAMUSCULAR | Status: AC
  Filled 2021-12-05: qty 30

## 2021-12-05 MED ORDER — KETAMINE HCL 50 MG/5ML IJ SOSY
PREFILLED_SYRINGE | INTRAMUSCULAR | Status: AC
  Filled 2021-12-05: qty 5

## 2021-12-05 MED ORDER — FENTANYL CITRATE (PF) 250 MCG/5ML IJ SOLN
INTRAMUSCULAR | Status: AC
  Filled 2021-12-05: qty 5

## 2021-12-05 MED ORDER — ONDANSETRON HCL 4 MG/2ML IJ SOLN
INTRAMUSCULAR | Status: DC | PRN
  Administered 2021-12-05: 4 mg via INTRAVENOUS

## 2021-12-05 MED ORDER — ENSURE PRE-SURGERY PO LIQD: 296.0000 mL | Freq: Once | ORAL | Status: DC

## 2021-12-05 MED ORDER — TRAMADOL HCL 50 MG PO TABS
ORAL_TABLET | ORAL | Status: AC
  Filled 2021-12-05: qty 1

## 2021-12-05 MED ORDER — PROPOFOL 10 MG/ML IV BOLUS
INTRAVENOUS | Status: DC | PRN
  Administered 2021-12-05: 150 mg via INTRAVENOUS

## 2021-12-05 MED ORDER — KETOROLAC TROMETHAMINE 30 MG/ML IJ SOLN
INTRAMUSCULAR | Status: DC | PRN
  Administered 2021-12-05: 30 mg via INTRAVENOUS

## 2021-12-05 MED ORDER — ACETAMINOPHEN 500 MG PO TABS: 1000.0000 mg | ORAL_TABLET | ORAL | Status: DC

## 2021-12-05 MED ORDER — ACETAMINOPHEN 10 MG/ML IV SOLN
INTRAVENOUS | Status: AC
  Filled 2021-12-05: qty 100

## 2021-12-05 MED ORDER — LIDOCAINE 2% (20 MG/ML) 5 ML SYRINGE
INTRAMUSCULAR | Status: AC
  Filled 2021-12-05: qty 5

## 2021-12-05 MED ORDER — CEFAZOLIN SODIUM-DEXTROSE 2-4 GM/100ML-% IV SOLN
2.0000 g | INTRAVENOUS | Status: AC
  Administered 2021-12-05: 2 g via INTRAVENOUS
  Filled 2021-12-05: qty 100

## 2021-12-05 MED ORDER — DEXAMETHASONE SODIUM PHOSPHATE 10 MG/ML IJ SOLN
INTRAMUSCULAR | Status: AC
  Filled 2021-12-05: qty 1

## 2021-12-05 MED ORDER — MIDAZOLAM HCL 2 MG/2ML IJ SOLN
INTRAMUSCULAR | Status: DC | PRN
  Administered 2021-12-05 (×2): 1 mg via INTRAVENOUS

## 2021-12-05 MED ORDER — KETOROLAC TROMETHAMINE 30 MG/ML IJ SOLN
INTRAMUSCULAR | Status: AC
  Filled 2021-12-05: qty 1

## 2021-12-05 MED ORDER — STERILE WATER FOR IRRIGATION IR SOLN
Status: DC | PRN
  Administered 2021-12-05: 200 mL

## 2021-12-05 MED ORDER — FENTANYL CITRATE (PF) 100 MCG/2ML IJ SOLN
25.0000 ug | INTRAMUSCULAR | Status: DC | PRN
  Administered 2021-12-05 (×3): 50 ug via INTRAVENOUS

## 2021-12-05 MED ORDER — LACTATED RINGERS IV SOLN: INTRAVENOUS | Status: DC

## 2021-12-05 MED ORDER — LIDOCAINE 2% (20 MG/ML) 5 ML SYRINGE
INTRAMUSCULAR | Status: DC | PRN
  Administered 2021-12-05: 60 mg via INTRAVENOUS

## 2021-12-05 MED ORDER — KETAMINE HCL 10 MG/ML IJ SOLN
INTRAMUSCULAR | Status: DC | PRN
  Administered 2021-12-05: 30 mg via INTRAVENOUS

## 2021-12-05 MED ORDER — CHLORHEXIDINE GLUCONATE 0.12 % MT SOLN
15.0000 mL | Freq: Once | OROMUCOSAL | Status: AC
  Administered 2021-12-05: 15 mL via OROMUCOSAL
  Filled 2021-12-05: qty 15

## 2021-12-05 MED ORDER — 0.9 % SODIUM CHLORIDE (POUR BTL) OPTIME
TOPICAL | Status: DC | PRN
  Administered 2021-12-05: 1000 mL

## 2021-12-05 MED ORDER — PROPOFOL 1000 MG/100ML IV EMUL
INTRAVENOUS | Status: AC
  Filled 2021-12-05: qty 100

## 2021-12-05 MED ORDER — SUGAMMADEX SODIUM 200 MG/2ML IV SOLN
INTRAVENOUS | Status: DC | PRN
  Administered 2021-12-05: 200 mg via INTRAVENOUS

## 2021-12-05 MED ORDER — FENTANYL CITRATE (PF) 250 MCG/5ML IJ SOLN
INTRAMUSCULAR | Status: DC | PRN
  Administered 2021-12-05 (×2): 50 ug via INTRAVENOUS
  Administered 2021-12-05: 150 ug via INTRAVENOUS

## 2021-12-05 MED ORDER — ROCURONIUM BROMIDE 10 MG/ML (PF) SYRINGE
PREFILLED_SYRINGE | INTRAVENOUS | Status: DC | PRN
  Administered 2021-12-05: 60 mg via INTRAVENOUS

## 2021-12-05 MED ORDER — SODIUM CHLORIDE 0.9 % IR SOLN
Status: DC | PRN
  Administered 2021-12-05: 1000 mL

## 2021-12-05 MED ORDER — BUPIVACAINE HCL 0.25 % IJ SOLN
INTRAMUSCULAR | Status: DC | PRN
  Administered 2021-12-05: 10 mL

## 2021-12-05 MED ORDER — TRAMADOL HCL 50 MG PO TABS: 50.0000 mg | ORAL_TABLET | Freq: Four times a day (QID) | ORAL | 0 refills | Status: DC | PRN

## 2021-12-05 MED ORDER — TRAMADOL HCL 50 MG PO TABS
50.0000 mg | ORAL_TABLET | Freq: Once | ORAL | Status: AC
  Administered 2021-12-05: 50 mg via ORAL

## 2021-12-05 SURGICAL SUPPLY — 39 items
BAG COUNTER SPONGE SURGICOUNT (BAG) ×1 IMPLANT
CANISTER SUCT 3000ML PPV (MISCELLANEOUS) ×1 IMPLANT
CHLORAPREP W/TINT 26 (MISCELLANEOUS) ×1 IMPLANT
CLIP LIGATING HEMO O LOK GREEN (MISCELLANEOUS) ×1 IMPLANT
COVER SURGICAL LIGHT HANDLE (MISCELLANEOUS) ×1 IMPLANT
DERMABOND ADVANCED .7 DNX12 (GAUZE/BANDAGES/DRESSINGS) ×1 IMPLANT
ELECT REM PT RETURN 9FT ADLT (ELECTROSURGICAL) ×1
ELECTRODE REM PT RTRN 9FT ADLT (ELECTROSURGICAL) ×1 IMPLANT
GLOVE BIO SURGEON STRL SZ7.5 (GLOVE) ×1 IMPLANT
GLOVE SURG SYN 7.5  E (GLOVE) ×1
GLOVE SURG SYN 7.5 E (GLOVE) ×1 IMPLANT
GLOVE SURG SYN 7.5 PF PI (GLOVE) ×1 IMPLANT
GOWN STRL REUS W/ TWL LRG LVL3 (GOWN DISPOSABLE) ×2 IMPLANT
GOWN STRL REUS W/ TWL XL LVL3 (GOWN DISPOSABLE) ×1 IMPLANT
GOWN STRL REUS W/TWL LRG LVL3 (GOWN DISPOSABLE) ×2
GOWN STRL REUS W/TWL XL LVL3 (GOWN DISPOSABLE) ×1
GRASPER SUT TROCAR 14GX15 (MISCELLANEOUS) ×1 IMPLANT
KIT BASIN OR (CUSTOM PROCEDURE TRAY) ×1 IMPLANT
KIT TURNOVER KIT B (KITS) ×1 IMPLANT
NDL INSUFFLATION 14GA 120MM (NEEDLE) ×1 IMPLANT
NEEDLE INSUFFLATION 14GA 120MM (NEEDLE) ×1 IMPLANT
NS IRRIG 1000ML POUR BTL (IV SOLUTION) ×1 IMPLANT
PAD ARMBOARD 7.5X6 YLW CONV (MISCELLANEOUS) ×1 IMPLANT
POUCH RETRIEVAL ECOSAC 10 (ENDOMECHANICALS) IMPLANT
POUCH RETRIEVAL ECOSAC 10MM (ENDOMECHANICALS) ×1
SCISSORS LAP 5X35 DISP (ENDOMECHANICALS) ×1 IMPLANT
SET IRRIG TUBING LAPAROSCOPIC (IRRIGATION / IRRIGATOR) ×1 IMPLANT
SET TUBE SMOKE EVAC HIGH FLOW (TUBING) ×1 IMPLANT
SLEEVE Z-THREAD 5X100MM (TROCAR) IMPLANT
SPECIMEN JAR SMALL (MISCELLANEOUS) ×1 IMPLANT
SUT MNCRL AB 4-0 PS2 18 (SUTURE) ×1 IMPLANT
SUT VICRYL 0 UR6 27IN ABS (SUTURE) IMPLANT
TOWEL GREEN STERILE (TOWEL DISPOSABLE) ×1 IMPLANT
TOWEL GREEN STERILE FF (TOWEL DISPOSABLE) ×1 IMPLANT
TRAY LAPAROSCOPIC MC (CUSTOM PROCEDURE TRAY) ×1 IMPLANT
TROCAR 11X100 Z THREAD (TROCAR) IMPLANT
TROCAR Z-THREAD OPTICAL 5X100M (TROCAR) ×1 IMPLANT
WARMER LAPAROSCOPE (MISCELLANEOUS) ×1 IMPLANT
WATER STERILE IRR 1000ML POUR (IV SOLUTION) ×1 IMPLANT

## 2021-12-05 NOTE — Anesthesia Procedure Notes (Signed)
Procedure Name: Intubation Date/Time: 12/05/2021 7:44 AM  Performed by: Erick Colace, CRNAPre-anesthesia Checklist: Patient identified, Emergency Drugs available, Suction available and Patient being monitored Patient Re-evaluated:Patient Re-evaluated prior to induction Oxygen Delivery Method: Circle system utilized Preoxygenation: Pre-oxygenation with 100% oxygen Induction Type: IV induction Ventilation: Mask ventilation without difficulty Laryngoscope Size: Mac and 4 Grade View: Grade I Tube type: Oral Tube size: 7.0 mm Number of attempts: 1 Airway Equipment and Method: Stylet and Oral airway Placement Confirmation: ETT inserted through vocal cords under direct vision, positive ETCO2 and breath sounds checked- equal and bilateral Secured at: 21 cm Tube secured with: Tape Dental Injury: Teeth and Oropharynx as per pre-operative assessment

## 2021-12-05 NOTE — Discharge Instructions (Signed)
[facility] ______[facility] LAPAROSCOPIC SURGERY: POST OP INSTRUCTIONS Always review your discharge instruction sheet given to you by the facility where your surgery was performed. IF YOU HAVE DISABILITY OR FAMILY LEAVE FORMS, YOU MUST BRING THEM TO THE OFFICE FOR PROCESSING.   DO NOT GIVE THEM TO YOUR DOCTOR.  A prescription for pain medication may be given to you upon discharge.  Take your pain medication as prescribed, if needed.  If narcotic pain medicine is not needed, then you may take acetaminophen (Tylenol) or ibuprofen (Advil) as needed. Take your usually prescribed medications unless otherwise directed. If you need a refill on your pain medication, please contact your pharmacy.  They will contact our office to request authorization. Prescriptions will not be filled after 5pm or on week-ends. You should follow a light diet the first few days after arrival home, such as soup and crackers, etc.  Be sure to include lots of fluids daily. Most patients will experience some swelling and bruising in the area of the incisions.  Ice packs will help.  Swelling and bruising can take several days to resolve.  It is common to experience some constipation if taking pain medication after surgery.  Increasing fluid intake and taking a stool softener (such as Colace) will usually help or prevent this problem from occurring.  A mild laxative (Milk of Magnesia or Miralax) should be taken according to package instructions if there are no bowel movements after 48 hours. Unless discharge instructions indicate otherwise, you may remove your bandages 24-48 hours after surgery, and you may shower at that time.  You may have steri-strips (small skin tapes) in place directly over the incision.  These strips should be left on the skin for 7-10 days.  If your surgeon used skin glue on the incision, you may shower in 24 hours.  The glue will flake off over the next 2-3 weeks.  Any sutures or staples will be  removed at the office during your follow-up visit. ACTIVITIES:  You may resume regular (light) daily activities beginning the next day--such as daily self-care, walking, climbing stairs--gradually increasing activities as tolerated.  You may have sexual intercourse when it is comfortable.  Refrain from any heavy lifting or straining until approved by your doctor. You may drive when you are no longer taking prescription pain medication, you can comfortably wear a seatbelt, and you can safely maneuver your car and apply brakes. RETURN TO WORK:  __________________________________________________________ You should see your doctor in the office for a follow-up appointment approximately 2-3 weeks after your surgery.  Make sure that you call for this appointment within a day or two after you arrive home to insure a convenient appointment time. OTHER INSTRUCTIONS: __________________________________________________________________________________________________________________________ __________________________________________________________________________________________________________________________ WHEN TO CALL YOUR DOCTOR: Fever over 101.0 Inability to urinate Continued bleeding from incision. Increased pain, redness, or drainage from the incision. Increasing abdominal pain  The clinic staff is available to answer your questions during regular business hours.  Please don't hesitate to call and ask to speak to one of the nurses for clinical concerns.  If you have a medical emergency, go to the nearest emergency room or call 911.  A surgeon from [facility] is always on call at the hospital. [address], [address], [place], [place]  [address] ? [address] [address], [place], [place]   [address] [phone] ? [phone] ? FAX [phone] Web site: [email or web address]  

## 2021-12-05 NOTE — Transfer of Care (Signed)
Immediate Anesthesia Transfer of Care Note  Patient: Deborah Peters  Procedure(s) Performed: LAPAROSCOPIC CHOLECYSTECTOMY (Abdomen)  Patient Location: PACU  Anesthesia Type:General  Level of Consciousness: awake  Airway & Oxygen Therapy: Patient Spontanous Breathing  Post-op Assessment: Report given to RN and Post -op Vital signs reviewed and stable  Post vital signs: Reviewed and stable  Last Vitals:  Vitals Value Taken Time  BP 131/78 12/05/21 0841  Temp    Pulse 73 12/05/21 0842  Resp 8 12/05/21 0842  SpO2 95 % 12/05/21 0842  Vitals shown include unvalidated device data.  Last Pain:  Vitals:   12/05/21 0643  TempSrc:   PainSc: 7          Complications: No notable events documented.

## 2021-12-05 NOTE — Interval H&P Note (Signed)
History and Physical Interval Note:  12/05/2021 6:53 AM  Deborah Peters  has presented today for surgery, with the diagnosis of GALLSTONES.  The various methods of treatment have been discussed with the patient and family. After consideration of risks, benefits and other options for treatment, the patient has consented to  Procedure(s): LAPAROSCOPIC CHOLECYSTECTOMY (N/A) as a surgical intervention.  The patient's history has been reviewed, patient examined, no change in status, stable for surgery.  I have reviewed the patient's chart and labs.  Questions were answered to the patient's satisfaction.     Ralene Ok

## 2021-12-05 NOTE — Op Note (Addendum)
12/05/2021  8:27 AM  PATIENT:  Deborah Peters  66 y.o. female  PRE-OPERATIVE DIAGNOSIS:  GALLSTONES  POST-OPERATIVE DIAGNOSIS:  GALLSTONES  PROCEDURE:  Procedure(s): LAPAROSCOPIC CHOLECYSTECTOMY (N/A)  SURGEON:  Surgeon(s) and Role:    Ralene Ok, MD - Primary   ASSISTANTS: Toula Moos, PGY-5   ANESTHESIA:   local and general  EBL:  minimal   BLOOD ADMINISTERED:none  DRAINS: none   LOCAL MEDICATIONS USED:  BUPIVICAINE   SPECIMEN:  Source of Specimen:  gallbladder  DISPOSITION OF SPECIMEN:  PATHOLOGY  COUNTS:  YES  TOURNIQUET:  * No tourniquets in log *  DICTATION: .Dragon Dictation  Findings: chronic inflammation of the gallbladder  The patient was taken to the operating and placed in the supine position with bilateral SCDs in place.  The patient was prepped and draped in the usual sterile fashion. A time out was called and all facts were verified. A pneumoperitoneum was obtained via A Veress needle technique to a pressure of 71m of mercury.  A 563mtrochar was then placed in the right upper quadrant under visualization, and there were no injuries to any abdominal organs. A 11 mm port was then placed in the umbilical region after infiltrating with local anesthesia under direct visualization. A second and third epigastric port and right lower quadrant port placement under direct visualization, respectively.    The gallbladder was identified and retracted, the peritoneum was then sharply dissected from the gallbladder and this dissection was carried down to Calot's triangle. The cystic duct was identified and stripped away circumferentially and seen going into the gallbladder 360, the critical angle was obtained.  2 clips were placed proximally one distally and the cystic duct transected. The cystic artery was identified and 2 clips placed proximally and one distally and transected.  We then proceeded to remove the gallbladder off the hepatic fossa with Bovie  cautery. A retrieval bag was then placed in the abdomen and gallbladder placed in the bag. The hepatic fossa was then reexamined and hemostasis was achieved with Bovie cautery and was excellent at the end of the case.   The subhepatic fossa and perihepatic fossa was then irrigated until the effluent was clear.  The gallbladder and bag were removed from the abdominal cavity. The 11 mm trocar fascia was reapproximated with the Endo Close #1 Vicryl x3.  The pneumoperitoneum was evacuated and all trochars removed under direct visulalization.  The skin was then closed with 4-0 Monocryl and the skin dressed with Dermabond.    The patient was awaken from general anesthesia and taken to the recovery room in stable condition.   I was personally present during the key and critical portions of this procedure and immediately available throughout the entire procedure, as documented in my operative note.   PLAN OF CARE: Discharge to home after PACU  PATIENT DISPOSITION:  PACU - hemodynamically stable.   Delay start of Pharmacological VTE agent (>24hrs) due to surgical blood loss or risk of bleeding: not applicable

## 2021-12-05 NOTE — Anesthesia Postprocedure Evaluation (Signed)
Anesthesia Post Note  Patient: Deborah Peters  Procedure(s) Performed: LAPAROSCOPIC CHOLECYSTECTOMY (Abdomen)     Patient location during evaluation: PACU Anesthesia Type: General Level of consciousness: awake Pain management: pain level controlled Vital Signs Assessment: post-procedure vital signs reviewed and stable Respiratory status: spontaneous breathing, nonlabored ventilation, respiratory function stable and patient connected to nasal cannula oxygen Cardiovascular status: blood pressure returned to baseline and stable Postop Assessment: no apparent nausea or vomiting Anesthetic complications: no   No notable events documented.  Last Vitals:  Vitals:   12/05/21 0910 12/05/21 0925  BP: (!) 129/58 112/80  Pulse: 76 74  Resp: 16 12  Temp:  36.7 C  SpO2: 93% 94%    Last Pain:  Vitals:   12/05/21 0925  TempSrc:   PainSc: 3                  Xachary Hambly P Dulcy Sida

## 2021-12-06 ENCOUNTER — Encounter (HOSPITAL_COMMUNITY): Payer: Self-pay | Admitting: General Surgery

## 2021-12-06 LAB — SURGICAL PATHOLOGY

## 2021-12-12 ENCOUNTER — Encounter: Payer: PPO | Admitting: Surgery

## 2021-12-13 ENCOUNTER — Inpatient Hospital Stay: Admission: RE | Admit: 2021-12-13 | Payer: PPO | Source: Ambulatory Visit

## 2021-12-19 ENCOUNTER — Encounter: Payer: PPO | Admitting: Surgery

## 2021-12-22 IMAGING — CT CT ANGIO CHEST
2 of 7 series · 17 of 46 positions shown · IV contrast (omnipaque)
Comparison: None.

CLINICAL DATA: Positive D-dimer

EXAM:
CT ANGIOGRAPHY CHEST WITH CONTRAST
TECHNIQUE: Multidetector CT imaging of the chest was performed using the
standard protocol during bolus administration of intravenous
contrast. Multiplanar CT image reconstructions and MIPs were
obtained to evaluate the vascular anatomy.
CONTRAST:  75mL OMNIPAQUE IOHEXOL 350 MG/ML SOLN

[Series 4: pe axial thins · axial · 0.65mm/px · z∈[-628,-382]mm · 14 of 284 slices shown]
[im 19/284  lung]
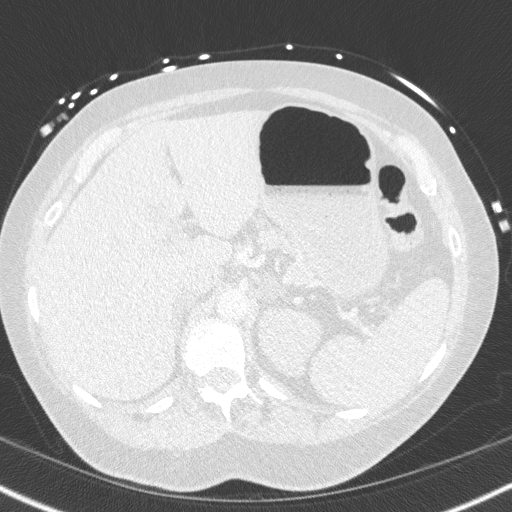
[im 38/284  soft-tissue]
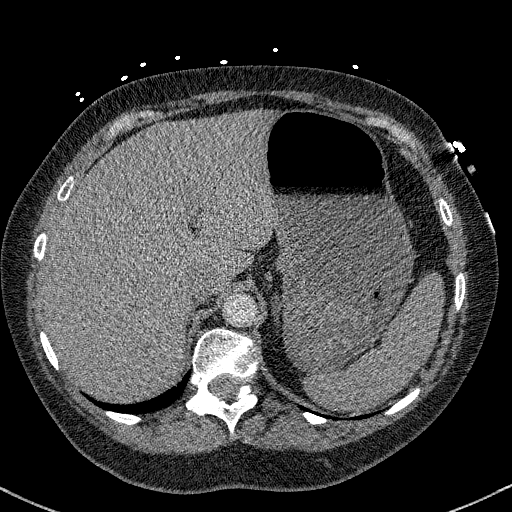
[im 57/284  lung]
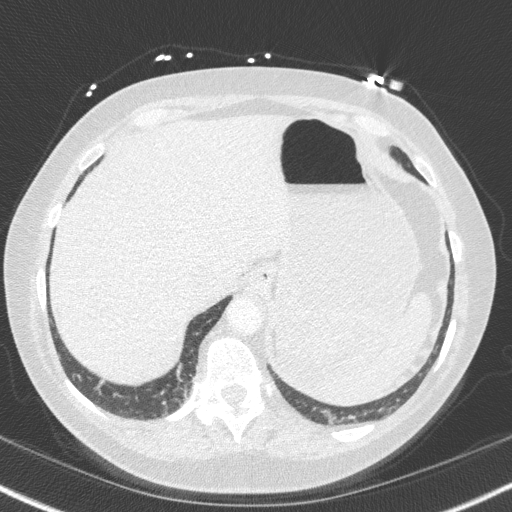
[im 76/284  soft-tissue]
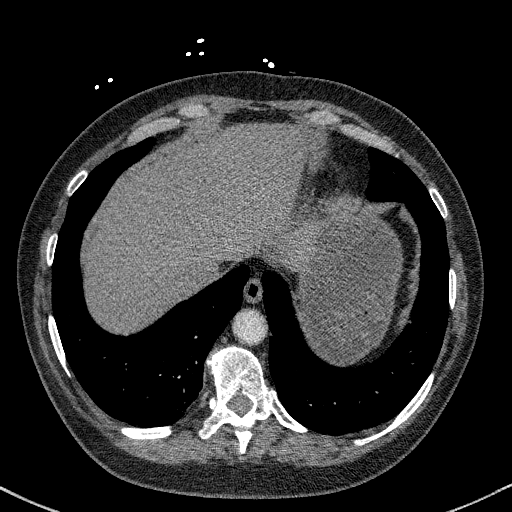
[im 95/284  lung]
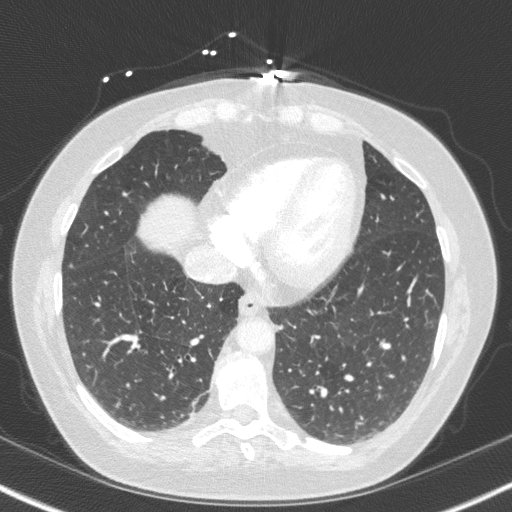
[im 114/284  soft-tissue]
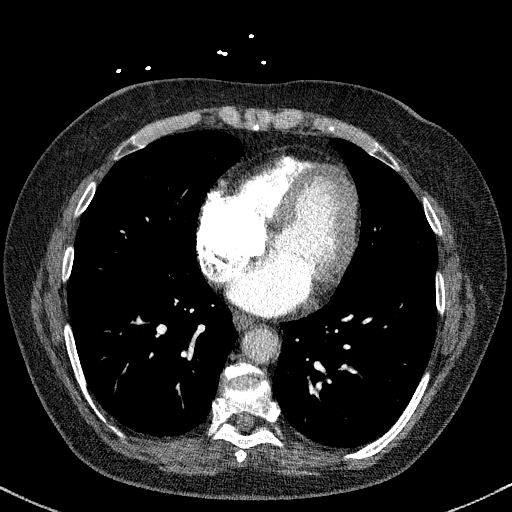
[im 133/284  lung]
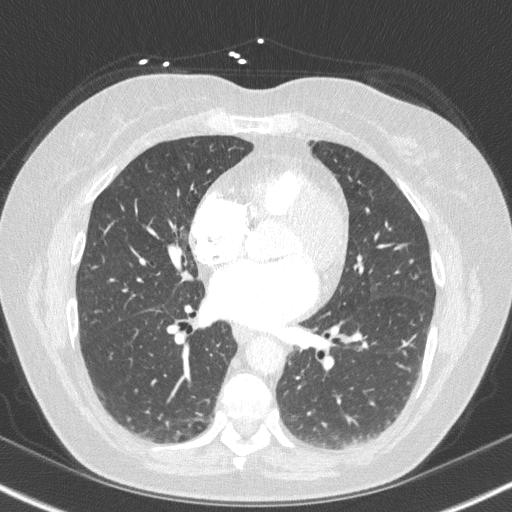
[im 151/284  soft-tissue]
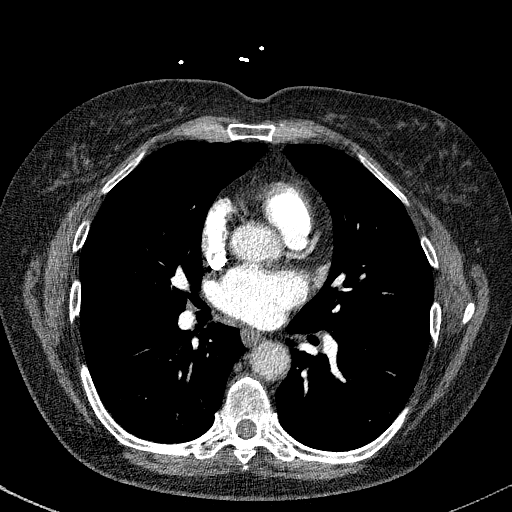
[im 170/284  lung]
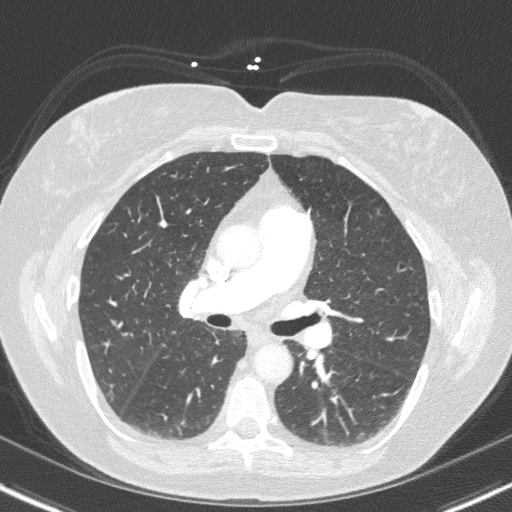
[im 189/284  soft-tissue]
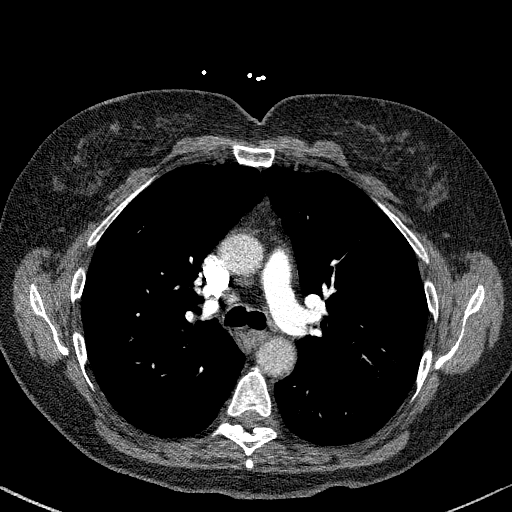
[im 208/284  lung]
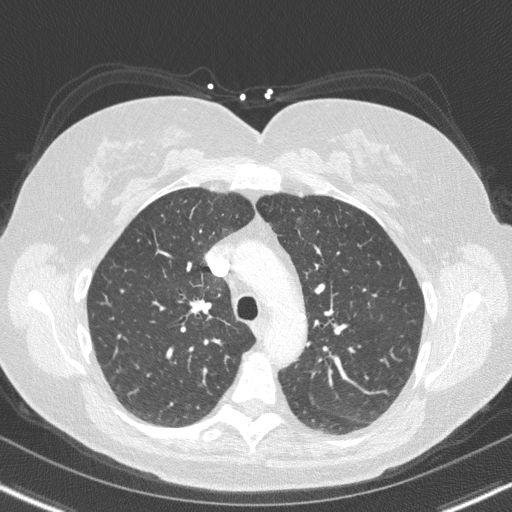
[im 227/284  soft-tissue]
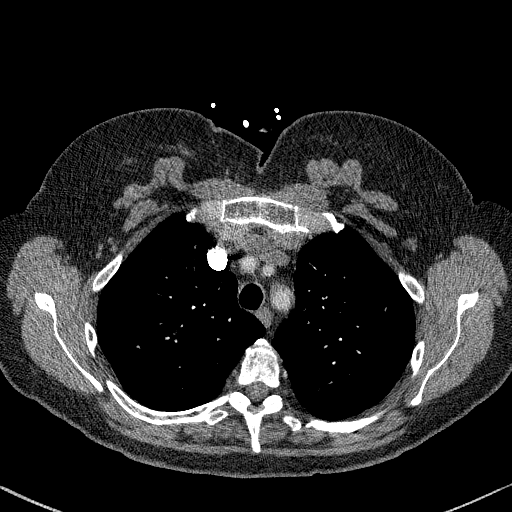
[im 246/284  lung]
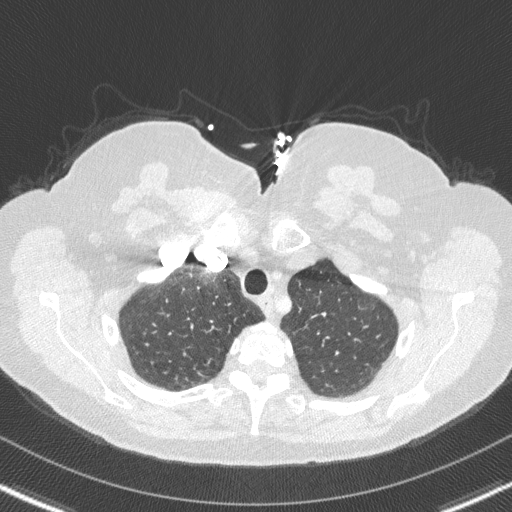
[im 265/284  soft-tissue]
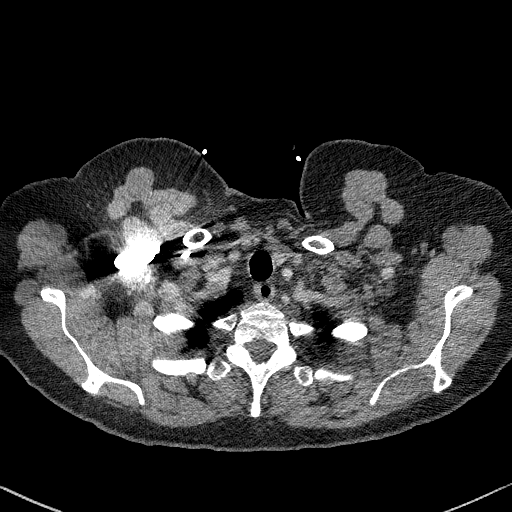

[Series 7: cor soft · coronal · 0.59mm/px · 3 of 143 slices shown]
[im 36/143  soft-tissue]
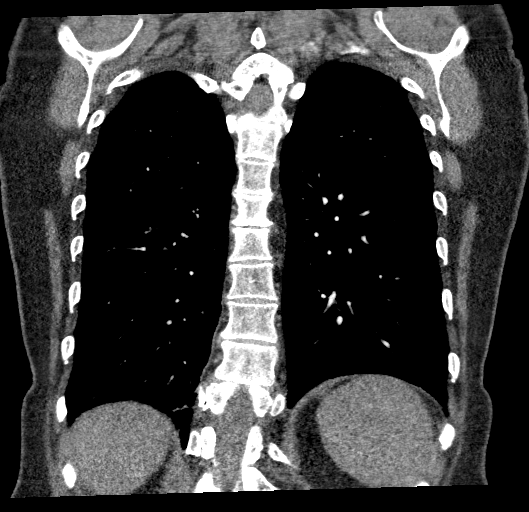
[im 72/143  soft-tissue]
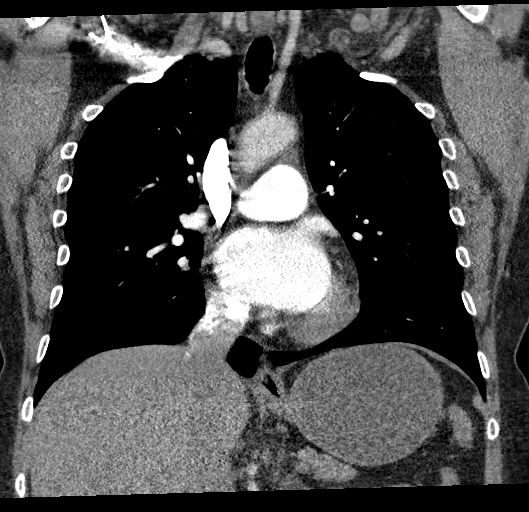
[im 107/143  soft-tissue]
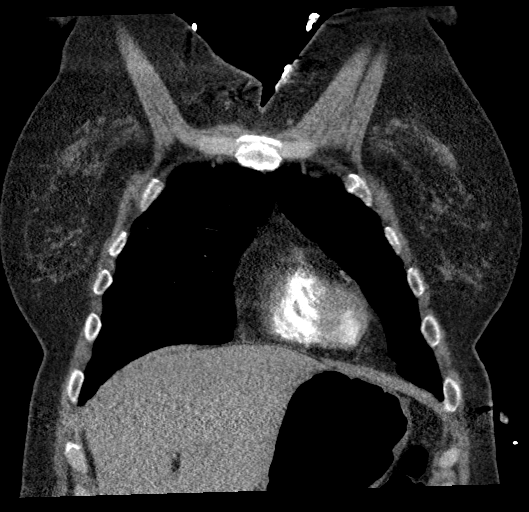

[17 of 46 positions shown; findings below may reference images not displayed]

FINDINGS: Cardiovascular: No filling defects in the pulmonary arteries to
suggest pulmonary emboli. Heart is normal size. Aorta is normal
caliber. Scattered aortic calcifications.

Mediastinum/Nodes: No mediastinal, hilar, or axillary adenopathy.
Trachea and esophagus are unremarkable. Thyroid unremarkable.

Lungs/Pleura: No confluent airspace opacities or effusions. Small
peripheral nodule in the left lower lobe measures 4 mm on image one
hundred six of series 5. Nodular areas along the right minor fissure
are unchanged since prior study. No effusions.

Upper Abdomen: Imaging into the upper abdomen demonstrates no acute
findings.

Musculoskeletal: Chest wall soft tissues are unremarkable. No acute
bony abnormality.

Review of the MIP images confirms the above findings.
IMPRESSION: No evidence of pulmonary embolus.

4 mm left lower lobe pulmonary nodule. Stable small nodules in the
right lung adjacent to the minus fissure. No follow-up needed if
patient is low-risk (and has no known or suspected primary
neoplasm). Non-contrast chest CT can be considered in 12 months if
patient is high-risk. This recommendation follows the consensus
statement: Guidelines for Management of Incidental Pulmonary Nodules
Detected on CT Images: From the [HOSPITAL] 1314; Radiology
1314; [DATE].

No acute cardiopulmonary disease.

Aortic Atherosclerosis ([id number]-QMN.N).

## 2021-12-29 ENCOUNTER — Ambulatory Visit: Payer: PPO | Admitting: Physician Assistant

## 2022-01-30 ENCOUNTER — Encounter: Payer: PPO | Admitting: Surgery

## 2022-03-23 ENCOUNTER — Other Ambulatory Visit: Payer: Self-pay

## 2022-03-23 ENCOUNTER — Encounter (HOSPITAL_BASED_OUTPATIENT_CLINIC_OR_DEPARTMENT_OTHER): Payer: Self-pay | Admitting: Emergency Medicine

## 2022-03-23 ENCOUNTER — Emergency Department (HOSPITAL_BASED_OUTPATIENT_CLINIC_OR_DEPARTMENT_OTHER): Payer: PPO

## 2022-03-23 ENCOUNTER — Emergency Department (HOSPITAL_BASED_OUTPATIENT_CLINIC_OR_DEPARTMENT_OTHER)
Admission: EM | Admit: 2022-03-23 | Discharge: 2022-03-24 | Discharge status: Against Medical Advice | Payer: PPO | Attending: Emergency Medicine | Admitting: Emergency Medicine

## 2022-03-23 DIAGNOSIS — S7001XA Contusion of right hip, initial encounter: Secondary | ICD-10-CM | POA: Diagnosis not present

## 2022-03-23 DIAGNOSIS — Z5321 Procedure and treatment not carried out due to patient leaving prior to being seen by health care provider: Secondary | ICD-10-CM | POA: Insufficient documentation

## 2022-03-23 DIAGNOSIS — I639 Cerebral infarction, unspecified: Secondary | ICD-10-CM | POA: Insufficient documentation

## 2022-03-23 DIAGNOSIS — T1490XA Injury, unspecified, initial encounter: Secondary | ICD-10-CM | POA: Diagnosis not present

## 2022-03-23 DIAGNOSIS — W182XXA Fall in (into) shower or empty bathtub, initial encounter: Secondary | ICD-10-CM | POA: Insufficient documentation

## 2022-03-23 DIAGNOSIS — R519 Headache, unspecified: Secondary | ICD-10-CM | POA: Diagnosis not present

## 2022-03-23 DIAGNOSIS — S0990XA Unspecified injury of head, initial encounter: Secondary | ICD-10-CM | POA: Insufficient documentation

## 2022-03-23 DIAGNOSIS — Y92002 Bathroom of unspecified non-institutional (private) residence single-family (private) house as the place of occurrence of the external cause: Secondary | ICD-10-CM | POA: Insufficient documentation

## 2022-03-23 DIAGNOSIS — I6381 Other cerebral infarction due to occlusion or stenosis of small artery: Secondary | ICD-10-CM | POA: Diagnosis not present

## 2022-03-23 DIAGNOSIS — S79911A Unspecified injury of right hip, initial encounter: Secondary | ICD-10-CM | POA: Diagnosis present

## 2022-03-23 NOTE — ED Triage Notes (Signed)
Fall while reaching for something Hit left side back of head on bathtube Happened last night Pain in head History of stroke. Takes clopidogrel daily  Denies loc  Gcs15  Right hip hematoma.

## 2022-04-07 ENCOUNTER — Other Ambulatory Visit: Payer: Self-pay | Admitting: *Deleted

## 2022-04-07 DIAGNOSIS — I714 Abdominal aortic aneurysm, without rupture, unspecified: Secondary | ICD-10-CM

## 2022-04-13 ENCOUNTER — Other Ambulatory Visit: Payer: Self-pay

## 2022-04-13 ENCOUNTER — Ambulatory Visit (HOSPITAL_COMMUNITY)
Admission: RE | Admit: 2022-04-13 | Discharge: 2022-04-13 | Disposition: A | Discharge status: Home / Self Care | Payer: PPO | Source: Ambulatory Visit | Attending: Vascular Surgery | Admitting: Vascular Surgery

## 2022-04-13 ENCOUNTER — Ambulatory Visit (INDEPENDENT_AMBULATORY_CARE_PROVIDER_SITE_OTHER): Payer: PPO | Admitting: Vascular Surgery

## 2022-04-13 ENCOUNTER — Encounter: Payer: Self-pay | Admitting: Vascular Surgery

## 2022-04-13 VITALS — BP 122/80 | HR 84 | Temp 97.9°F | Resp 20 | Ht 66.0 in | Wt 147.0 lb

## 2022-04-13 DIAGNOSIS — I714 Abdominal aortic aneurysm, without rupture, unspecified: Secondary | ICD-10-CM | POA: Insufficient documentation

## 2022-04-13 DIAGNOSIS — I7143 Infrarenal abdominal aortic aneurysm, without rupture: Secondary | ICD-10-CM | POA: Diagnosis not present

## 2022-04-13 DIAGNOSIS — I713 Abdominal aortic aneurysm, ruptured, unspecified: Secondary | ICD-10-CM

## 2022-04-13 NOTE — H&P (View-Only) (Signed)
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  Although the aorta is fairly small in size, given that these are saccular aneurysms I would recommend elective repair.  I would like to get a CT angiogram within her cuts to evaluate her for a potential stent graft.  I think she is likely a candidate for endovascular aneurysm repair.  She is also previously seen Dr. Einar Gip and will arrange preoperative cardiac clearance.  We have discussed the indications for repair of these aneurysms.  She understands that saccular aneurysms are at higher risk for rupture.  Fortunately, she is not a smoker.  Her blood pressure is under good control.  Pending her cardiac evaluation and follow-up CT angio we can make further recommendations and will likely then schedule her for endovascular aneurysm repair.  REASON FOR CONSULT:    2 saccular aortic aneurysms.  The consult is requested by Dr. Crist Infante.  HPI:   Deborah Peters is a 67 y.o. female who had some abdominal pain and ultimately underwent cholecystectomy.  She was found to have 2 saccular aneurysms during that workup and most recently a CT scan of the abdomen and pelvis on 11/17/2021 showed some slight enlargement of her saccular aneurysms and she is sent for vascular consultation.  She denies any family history of aneurysmal disease.  She is not a smoker.  She did have a stroke associated with right-sided weakness in 2021.  She tells me that she does have a history of congestive heart failure.  She denies any history of myocardial infarction.  She has had no recent chest pain or chest pressure.  She does admit to some dyspnea on exertion.  She denies any history of claudication or rest pain.   Past Medical History:  Diagnosis Date   Allergy    Anxiety    Chronic back pain    Constipation due to opioid therapy    COVID    1st one was  severe, no hospitalization, 2nd and 3rd were mild   DDD (degenerative disc disease), lumbar    Depression    Diabetes mellitus, type 2 ([place])    Fatty liver    GERD (gastroesophageal reflux disease)    past hx of    Gestational diabetes    no meds as of 06-01-2016.  diet controlled   Heart murmur    Heart murmur    Hyperlipidemia    Hypertension    pt this pain related - id occasionally high    IFG (impaired fasting glucose)    Meralgia paraesthetica    Migraines    no longer has migraines   Pneumonia    Post-menopausal    Scoliosis    Stroke [facility])    very mild right side weakness (right hand, shoulder)   Ulcer    years ago. gastric ulcer.    Family History  Problem Relation Age of Onset   Stroke Mother    Heart failure Father        Triple bypass   Melanoma Father    Colon polyps Sister    Colon polyps Brother    Cancer Maternal Grandmother    Cancer Maternal Grandfather    Stomach cancer Paternal Grandfather    Esophageal cancer Neg Hx    Rectal cancer Neg Hx    Colon cancer Neg Hx    Pancreatic cancer Neg Hx     SOCIAL HISTORY:  She is not a smoker  Allergies  Allergen Reactions   Sulfa Antibiotics Anaphylaxis   Amoxicillin-Pot Clavulanate Other (See Comments)    Unknown   Atorvastatin Other (See Comments)     felt terrible on it.  flu like.,    Ephedrine-Guaifenesin Other (See Comments)    Primatene/ unknown      Penicillin G Sodium Other (See Comments)    Unknown   Simvastatin     Other reaction(s): felt terrible, Other    Current Outpatient Medications  Medication Sig Dispense Refill   acetaminophen (TYLENOL) 650 MG CR tablet Take 1,300 mg by mouth every 8 (eight) hours as needed for pain.     ascorbic acid (VITAMIN C) 500 MG tablet Take 500 mg by mouth daily.     b complex vitamins capsule Take 1 capsule by mouth daily. Raw  B complex     benzonatate (TESSALON) 100 MG capsule Take 100 mg by mouth 3 (three) times daily as needed for cough.      cetirizine (ZYRTEC) 10 MG tablet Take 10 mg by mouth daily.     clopidogrel (PLAVIX) 75 MG tablet Take 75 mg by mouth daily.     Coenzyme Q10 (CO Q-10) 100 MG CAPS Take 100 mg by mouth daily.     diazepam (VALIUM) 5 MG tablet Take 5 mg by mouth 3 (three) times daily.     donepezil (ARICEPT) 5 MG tablet Take 5 mg by mouth at bedtime.     DULoxetine (CYMBALTA) 60 MG capsule Take 60 mg by mouth daily.     ezetimibe (ZETIA) 10 MG tablet Take 10 mg by mouth daily.  3   gabapentin (NEURONTIN) 400 MG capsule Take 400-800 mg by mouth See admin instructions. Take 400 mg in the morning and and afternoon and 800 mg at bedtidme     losartan (COZAAR) 25 MG tablet Take 50 mg by mouth every evening.     Magnesium 250 MG TABS Take 250 mg by mouth daily.     OMEGA-3 FATTY ACIDS PO Take 2,400 mg by mouth 2 (two) times daily. 1200 mg each     OVER THE COUNTER MEDICATION Take 1 capsule by mouth daily. Tumeric     OVER THE COUNTER MEDICATION Take 1,000 mg by mouth daily. Cinnamon daily     OVER THE COUNTER MEDICATION Take 1 tablet by mouth at bedtime. alteril natural sleep aide at bedtime     promethazine (PHENERGAN) 25 MG tablet Take 25 mg by mouth 2 (two) times daily as needed for nausea or vomiting.     Taurine 1000 MG CAPS Take 1,000 mg by mouth in the morning and at bedtime.     vitamin D3 (CHOLECALCIFEROL) 25 MCG tablet Take 1,000 Units by mouth daily.     zolpidem (AMBIEN CR) 6.25 MG CR tablet Take 6.25 mg by mouth at bedtime as needed for sleep.  5   atorvastatin (LIPITOR) 80 MG tablet Take 1 tablet (80 mg total) by mouth at bedtime. 30 tablet 1   No current facility-administered medications for this visit.    REVIEW OF SYSTEMS:  [X]$  denotes positive finding, [ ]$  denotes negative finding Cardiac  Comments:  Chest pain or chest pressure:    Shortness of breath upon exertion: xx   Short of breath when lying flat:    Irregular heart rhythm:        Vascular    Pain in calf, thigh, or hip brought on by  ambulation:  Pain in feet at night that wakes you up from your sleep:     Blood clot in your veins:    Leg swelling:         Pulmonary    Oxygen at home:    Productive cough:     Wheezing:         Neurologic    Sudden weakness in arms or legs:     Sudden numbness in arms or legs:     Sudden onset of difficulty speaking or slurred speech:    Temporary loss of vision in one eye:     Problems with dizziness:         Gastrointestinal    Blood in stool:     Vomited blood:         Genitourinary    Burning when urinating:     Blood in urine:        Psychiatric    Major depression:         Hematologic    Bleeding problems:    Problems with blood clotting too easily:        Skin    Rashes or ulcers:        Constitutional    Fever or chills:    -  PHYSICAL EXAM:   Vitals:   04/13/22 0820  BP: 122/80  Pulse: 84  Resp: 20  Temp: 97.9 F (36.6 C)  SpO2: 96%  Weight: 147 lb (66.7 kg)  Height: 5' 6"$  (1.676 m)   Body mass index is 23.73 kg/m. GENERAL: The patient is a well-nourished female, in no acute distress. The vital signs are documented above. CARDIAC: There is a regular rate and rhythm.  VASCULAR: I do not detect carotid bruits. She has palpable femoral, popliteal, and posterior tibial pulses bilaterally. PULMONARY: There is good air exchange bilaterally without wheezing or rales. ABDOMEN: Soft and non-tender with normal pitched bowel sounds.  Her aneurysm is palpable and nontender. MUSCULOSKELETAL: There are no major deformities. NEUROLOGIC: No focal weakness or paresthesias are detected. SKIN: There are no ulcers or rashes noted. PSYCHIATRIC: The patient has a normal affect.  DATA:    CT ABDOMEN PELVIS: I have reviewed the images of the CT abdomen and pelvis that was done on 11/17/2021.  This was to workup right upper abdominal pain.  She was noted to have 2 saccular aneurysms off of the infrarenal aorta.   LABS: I reviewed her most recent labs from  11/16/2021.  At that time her GFR was greater than 60.  Creatinine was 0.82.  DUPLEX ABDOMINAL AORTA: I have independently interpreted her duplex of the abdominal aorta today.  This 2 notes 2 saccular aneurysms.  In these areas the infrarenal aorta measures 3 cm in maximum diameter.  The right common iliac artery measures 1 cm in maximum diameter.  The left common iliac artery measures 1 cm in maximum diameter.  [name] [facility]

## 2022-04-13 NOTE — Progress Notes (Addendum)
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  Although the aorta is fairly small in size, given that these are saccular aneurysms I would recommend elective repair.  I would like to get a CT angiogram within her cuts to evaluate her for a potential stent graft.  I think she is likely a candidate for endovascular aneurysm repair.  She is also previously seen Dr. Einar Gip and will arrange preoperative cardiac clearance.  We have discussed the indications for repair of these aneurysms.  She understands that saccular aneurysms are at higher risk for rupture.  Fortunately, she is not a smoker.  Her blood pressure is under good control.  Pending her cardiac evaluation and follow-up CT angio we can make further recommendations and will likely then schedule her for endovascular aneurysm repair.  REASON FOR CONSULT:    2 saccular aortic aneurysms.  The consult is requested by Dr. Crist Infante.  HPI:   Deborah Peters is a 67 y.o. female who had some abdominal pain and ultimately underwent cholecystectomy.  She was found to have 2 saccular aneurysms during that workup and most recently a CT scan of the abdomen and pelvis on 11/17/2021 showed some slight enlargement of her saccular aneurysms and she is sent for vascular consultation.  She denies any family history of aneurysmal disease.  She is not a smoker.  She did have a stroke associated with right-sided weakness in 2021.  She tells me that she does have a history of congestive heart failure.  She denies any history of myocardial infarction.  She has had no recent chest pain or chest pressure.  She does admit to some dyspnea on exertion.  She denies any history of claudication or rest pain.   Past Medical History:  Diagnosis Date   Allergy    Anxiety    Chronic back pain    Constipation due to opioid therapy    COVID    1st one was  severe, no hospitalization, 2nd and 3rd were mild   DDD (degenerative disc disease), lumbar    Depression    Diabetes mellitus, type 2 ([place])    Fatty liver    GERD (gastroesophageal reflux disease)    past hx of    Gestational diabetes    no meds as of 06-01-2016.  diet controlled   Heart murmur    Heart murmur    Hyperlipidemia    Hypertension    pt this pain related - id occasionally high    IFG (impaired fasting glucose)    Meralgia paraesthetica    Migraines    no longer has migraines   Pneumonia    Post-menopausal    Scoliosis    Stroke [facility])    very mild right side weakness (right hand, shoulder)   Ulcer    years ago. gastric ulcer.    Family History  Problem Relation Age of Onset   Stroke Mother    Heart failure Father        Triple bypass   Melanoma Father    Colon polyps Sister    Colon polyps Brother    Cancer Maternal Grandmother    Cancer Maternal Grandfather    Stomach cancer Paternal Grandfather    Esophageal cancer Neg Hx    Rectal cancer Neg Hx    Colon cancer Neg Hx    Pancreatic cancer Neg Hx     SOCIAL HISTORY:  She is not a smoker  Allergies  Allergen Reactions   Sulfa Antibiotics Anaphylaxis   Amoxicillin-Pot Clavulanate Other (See Comments)    Unknown   Atorvastatin Other (See Comments)     felt terrible on it.  flu like.,    Ephedrine-Guaifenesin Other (See Comments)    Primatene/ unknown      Penicillin G Sodium Other (See Comments)    Unknown   Simvastatin     Other reaction(s): felt terrible, Other    Current Outpatient Medications  Medication Sig Dispense Refill   acetaminophen (TYLENOL) 650 MG CR tablet Take 1,300 mg by mouth every 8 (eight) hours as needed for pain.     ascorbic acid (VITAMIN C) 500 MG tablet Take 500 mg by mouth daily.     b complex vitamins capsule Take 1 capsule by mouth daily. Raw  B complex     benzonatate (TESSALON) 100 MG capsule Take 100 mg by mouth 3 (three) times daily as needed for cough.      cetirizine (ZYRTEC) 10 MG tablet Take 10 mg by mouth daily.     clopidogrel (PLAVIX) 75 MG tablet Take 75 mg by mouth daily.     Coenzyme Q10 (CO Q-10) 100 MG CAPS Take 100 mg by mouth daily.     diazepam (VALIUM) 5 MG tablet Take 5 mg by mouth 3 (three) times daily.     donepezil (ARICEPT) 5 MG tablet Take 5 mg by mouth at bedtime.     DULoxetine (CYMBALTA) 60 MG capsule Take 60 mg by mouth daily.     ezetimibe (ZETIA) 10 MG tablet Take 10 mg by mouth daily.  3   gabapentin (NEURONTIN) 400 MG capsule Take 400-800 mg by mouth See admin instructions. Take 400 mg in the morning and and afternoon and 800 mg at bedtidme     losartan (COZAAR) 25 MG tablet Take 50 mg by mouth every evening.     Magnesium 250 MG TABS Take 250 mg by mouth daily.     OMEGA-3 FATTY ACIDS PO Take 2,400 mg by mouth 2 (two) times daily. 1200 mg each     OVER THE COUNTER MEDICATION Take 1 capsule by mouth daily. Tumeric     OVER THE COUNTER MEDICATION Take 1,000 mg by mouth daily. Cinnamon daily     OVER THE COUNTER MEDICATION Take 1 tablet by mouth at bedtime. alteril natural sleep aide at bedtime     promethazine (PHENERGAN) 25 MG tablet Take 25 mg by mouth 2 (two) times daily as needed for nausea or vomiting.     Taurine 1000 MG CAPS Take 1,000 mg by mouth in the morning and at bedtime.     vitamin D3 (CHOLECALCIFEROL) 25 MCG tablet Take 1,000 Units by mouth daily.     zolpidem (AMBIEN CR) 6.25 MG CR tablet Take 6.25 mg by mouth at bedtime as needed for sleep.  5   atorvastatin (LIPITOR) 80 MG tablet Take 1 tablet (80 mg total) by mouth at bedtime. 30 tablet 1   No current facility-administered medications for this visit.    REVIEW OF SYSTEMS:  [X]$  denotes positive finding, [ ]$  denotes negative finding Cardiac  Comments:  Chest pain or chest pressure:    Shortness of breath upon exertion: xx   Short of breath when lying flat:    Irregular heart rhythm:        Vascular    Pain in calf, thigh, or hip brought on by  ambulation:  Pain in feet at night that wakes you up from your sleep:     Blood clot in your veins:    Leg swelling:         Pulmonary    Oxygen at home:    Productive cough:     Wheezing:         Neurologic    Sudden weakness in arms or legs:     Sudden numbness in arms or legs:     Sudden onset of difficulty speaking or slurred speech:    Temporary loss of vision in one eye:     Problems with dizziness:         Gastrointestinal    Blood in stool:     Vomited blood:         Genitourinary    Burning when urinating:     Blood in urine:        Psychiatric    Major depression:         Hematologic    Bleeding problems:    Problems with blood clotting too easily:        Skin    Rashes or ulcers:        Constitutional    Fever or chills:    -  PHYSICAL EXAM:   Vitals:   04/13/22 0820  BP: 122/80  Pulse: 84  Resp: 20  Temp: 97.9 F (36.6 C)  SpO2: 96%  Weight: 147 lb (66.7 kg)  Height: 5' 6"$  (1.676 m)   Body mass index is 23.73 kg/m. GENERAL: The patient is a well-nourished female, in no acute distress. The vital signs are documented above. CARDIAC: There is a regular rate and rhythm.  VASCULAR: I do not detect carotid bruits. She has palpable femoral, popliteal, and posterior tibial pulses bilaterally. PULMONARY: There is good air exchange bilaterally without wheezing or rales. ABDOMEN: Soft and non-tender with normal pitched bowel sounds.  Her aneurysm is palpable and nontender. MUSCULOSKELETAL: There are no major deformities. NEUROLOGIC: No focal weakness or paresthesias are detected. SKIN: There are no ulcers or rashes noted. PSYCHIATRIC: The patient has a normal affect.  DATA:    CT ABDOMEN PELVIS: I have reviewed the images of the CT abdomen and pelvis that was done on 11/17/2021.  This was to workup right upper abdominal pain.  She was noted to have 2 saccular aneurysms off of the infrarenal aorta.   LABS: I reviewed her most recent labs from  11/16/2021.  At that time her GFR was greater than 60.  Creatinine was 0.82.  DUPLEX ABDOMINAL AORTA: I have independently interpreted her duplex of the abdominal aorta today.  This 2 notes 2 saccular aneurysms.  In these areas the infrarenal aorta measures 3 cm in maximum diameter.  The right common iliac artery measures 1 cm in maximum diameter.  The left common iliac artery measures 1 cm in maximum diameter.  [name] [facility]

## 2022-04-14 NOTE — Progress Notes (Unsigned)
Primary Physician/Referring:  Crist Infante, MD  Patient ID: Deborah Peters, female    DOB: 12-27-1955, 67 y.o.   MRN: [id number]  No chief complaint on file.  HPI:    Deborah Peters  is a 67 y.o. Caucasian female patient with history of TIA/stroke in 2021, aortic atherosclerosis, mild coronary calcification in the 52nd percentile with no significant coronary disease by CTA in 2022, hypertension, hyperlipidemia, hyperglycemia who underwent CT angiogram of the abdomen on 11/17/2021 and was found to have saccular aneurysms measuring approximately 2.5 cm felt to be high risk for rupture and has been scheduled for endovascular repair by Dr. Gae Gallop, referred to me for preoperative cardiac restratification.   She has no specific complaints except for chronic dyspnea, no PND orthopnea or leg edema.  She is extremely concerned about the findings on the CT angiogram of the abdomen and new diagnosis of AAA. She was recommended endovascular repair of the AAA by Dr. Scot Dock and presents for f/u.   Past Medical History:  Diagnosis Date   Allergy    Anxiety    Chronic back pain    Constipation due to opioid therapy    COVID    1st one was severe, no hospitalization, 2nd and 3rd were mild   DDD (degenerative disc disease), lumbar    Depression    Diabetes mellitus, type 2 ([place])    Fatty liver    GERD (gastroesophageal reflux disease)    past hx of    Gestational diabetes    no meds as of 06-01-2016.  diet controlled   Heart murmur    Heart murmur    Hyperlipidemia    Hypertension    pt this pain related - id occasionally high    IFG (impaired fasting glucose)    Meralgia paraesthetica    Migraines    no longer has migraines   Pneumonia    Post-menopausal    Scoliosis    Stroke [facility])    very mild right side weakness (right hand, shoulder)   Ulcer    years ago. gastric ulcer.   Past Surgical History:  Procedure Laterality Date   ABDOMINAL HYSTERECTOMY     has her ovaries   BACK  SURGERY  2011   L5 S1   CHOLECYSTECTOMY N/A 12/05/2021   Procedure: LAPAROSCOPIC CHOLECYSTECTOMY;  Surgeon: Ralene Ok, MD;  Location: [facility] OR;  Service: General;  Laterality: N/A;   COLONOSCOPY     JOINT REPLACEMENT     L knee   knee surgeries     x 8 before replacement    POLYPECTOMY     HPP in 2008 DB   Family History  Problem Relation Age of Onset   Stroke Mother    Heart failure Father        Triple bypass   Melanoma Father    Colon polyps Sister    Colon polyps Brother    Cancer Maternal Grandmother    Cancer Maternal Grandfather    Stomach cancer Paternal Grandfather    Esophageal cancer Neg Hx    Rectal cancer Neg Hx    Colon cancer Neg Hx    Pancreatic cancer Neg Hx     Social History   Tobacco Use   Smoking status: Former    Types: Cigarettes    Passive exposure: Current   Smokeless tobacco: Never  Substance Use Topics   Alcohol use: Not Currently    Comment: Rare   Marital Status: Married  ROS  Review of Systems  Cardiovascular:  Positive for dyspnea on exertion. Negative for chest pain and leg swelling.  Musculoskeletal:  Positive for back pain.   Objective  There were no vitals taken for this visit. There is no height or weight on file to calculate BMI.     04/13/2022    8:20 AM 03/23/2022    7:17 PM 12/05/2021    9:25 AM  Vitals with BMI  Height '5\' 6"'$     Weight 147 lbs    BMI 16.10    Systolic 960 454 098  Diastolic 80 75 80  Pulse 84 78 74    Physical Exam Neck:     Vascular: No carotid bruit or JVD.  Cardiovascular:     Rate and Rhythm: Normal rate and regular rhythm.     Pulses: Intact distal pulses.     Heart sounds: Normal heart sounds. No murmur heard.    No gallop.  Pulmonary:     Effort: Pulmonary effort is normal.     Breath sounds: Normal breath sounds.  Abdominal:     General: Bowel sounds are normal.     Palpations: Abdomen is soft.  Musculoskeletal:     Right lower leg: No edema.     Left lower leg: No edema.      Medications and allergies   Allergies  Allergen Reactions   Sulfa Antibiotics Anaphylaxis   Amoxicillin-Pot Clavulanate Other (See Comments)    Unknown   Atorvastatin Other (See Comments)     felt terrible on it.  flu like.,    Ephedrine-Guaifenesin Other (See Comments)    Primatene/ unknown      Penicillin G Sodium Other (See Comments)    Unknown   Simvastatin     Other reaction(s): felt terrible, Other    Medication list after today's encounter    Current Outpatient Medications:    acetaminophen (TYLENOL) 650 MG CR tablet, Take 1,300 mg by mouth every 8 (eight) hours as needed for pain., Disp: , Rfl:    ascorbic acid (VITAMIN C) 500 MG tablet, Take 500 mg by mouth daily., Disp: , Rfl:    atorvastatin (LIPITOR) 80 MG tablet, Take 1 tablet (80 mg total) by mouth at bedtime., Disp: 30 tablet, Rfl: 1   b complex vitamins capsule, Take 1 capsule by mouth daily. Raw  B complex, Disp: , Rfl:    benzonatate (TESSALON) 100 MG capsule, Take 100 mg by mouth 3 (three) times daily as needed for cough., Disp: , Rfl:    cetirizine (ZYRTEC) 10 MG tablet, Take 10 mg by mouth daily., Disp: , Rfl:    clopidogrel (PLAVIX) 75 MG tablet, Take 75 mg by mouth daily., Disp: , Rfl:    Coenzyme Q10 (CO Q-10) 100 MG CAPS, Take 100 mg by mouth daily., Disp: , Rfl:    diazepam (VALIUM) 5 MG tablet, Take 5 mg by mouth 3 (three) times daily., Disp: , Rfl:    donepezil (ARICEPT) 5 MG tablet, Take 5 mg by mouth at bedtime., Disp: , Rfl:    DULoxetine (CYMBALTA) 60 MG capsule, Take 60 mg by mouth daily., Disp: , Rfl:    ezetimibe (ZETIA) 10 MG tablet, Take 10 mg by mouth daily., Disp: , Rfl: 3   gabapentin (NEURONTIN) 400 MG capsule, Take 400-800 mg by mouth See admin instructions. Take 400 mg in the morning and and afternoon and 800 mg at bedtidme, Disp: , Rfl:    losartan (COZAAR) 25 MG tablet, Take 50 mg by  mouth every evening., Disp: , Rfl:    Magnesium 250 MG TABS, Take 250 mg by mouth daily., Disp: ,  Rfl:    OMEGA-3 FATTY ACIDS PO, Take 2,400 mg by mouth 2 (two) times daily. 1200 mg each, Disp: , Rfl:    OVER THE COUNTER MEDICATION, Take 1 capsule by mouth daily. Tumeric, Disp: , Rfl:    OVER THE COUNTER MEDICATION, Take 1,000 mg by mouth daily. Cinnamon daily, Disp: , Rfl:    OVER THE COUNTER MEDICATION, Take 1 tablet by mouth at bedtime. alteril natural sleep aide at bedtime, Disp: , Rfl:    promethazine (PHENERGAN) 25 MG tablet, Take 25 mg by mouth 2 (two) times daily as needed for nausea or vomiting., Disp: , Rfl:    Taurine 1000 MG CAPS, Take 1,000 mg by mouth in the morning and at bedtime., Disp: , Rfl:    vitamin D3 (CHOLECALCIFEROL) 25 MCG tablet, Take 1,000 Units by mouth daily., Disp: , Rfl:    zolpidem (AMBIEN CR) 6.25 MG CR tablet, Take 6.25 mg by mouth at bedtime as needed for sleep., Disp: , Rfl: 5  .med Laboratory examination:   Recent Labs    11/16/21 1414  NA 138  K 4.3  CL 105  CO2 23  GLUCOSE 92  BUN 9  CREATININE 0.82  CALCIUM 9.5  GFRNONAA >60       Latest Ref Rng & Units 11/16/2021    2:14 PM 11/27/2020   12:31 AM 11/25/2020    3:05 PM  CMP  Glucose 70 - 99 mg/dL 92  129  162   BUN 8 - 23 mg/dL '9  11  7   '$ Creatinine 0.44 - 1.00 mg/dL 0.82  1.00  1.11   Sodium 135 - 145 mmol/L 138  137  144   Potassium 3.5 - 5.1 mmol/L 4.3  3.6  4.6   Chloride 98 - 111 mmol/L 105  101  103   CO2 22 - 32 mmol/L '23  26  27   '$ Calcium 8.9 - 10.3 mg/dL 9.5  9.6  9.8   Total Protein 6.5 - 8.1 g/dL 6.6  6.7  7.0   Total Bilirubin 0.3 - 1.2 mg/dL 0.4  0.6  0.4   Alkaline Phos 38 - 126 U/L 64  52  70   AST 15 - 41 U/L '14  17  21   '$ ALT 0 - 44 U/L '12  14  14       '$ Latest Ref Rng & Units 11/16/2021    2:14 PM 11/27/2020   12:31 AM 03/21/2020    1:07 PM  CBC  WBC 4.0 - 10.5 K/uL 9.0  11.0    Hemoglobin 12.0 - 15.0 g/dL 12.9  12.9  12.6   Hematocrit 36.0 - 46.0 % 38.9  38.5  37.0   Platelets 150 - 400 K/uL 252  296     External labs:   Labs 11/10/2021:  Serum glucose  109 mg, BUN 7, creatinine 0.6, EGFR 1 mL, potassium 4.3, LFTs normal.  Hb 11.8/HCT 33.5, platelets 198, normal indicis.  Total cholesterol 90, triglycerides 89, HDL 37, LDL 35.  TSH normal at 1.50.  A1c 5.8%.  Radiology:   MRI brain without contrast: 03/16/2020: 1. 1 cm acute to early subacute ischemic left thalamocapsular infarct. No associated hemorrhage or mass effect. 2. Additional remote lacunar infarct involving the adjacent left basal ganglia. 3. Underlying mild chronic microvascular ischemic disease.   MRA head without contrast: 03/17/2020 1.  3 mm left paraophthalmic artery aneurysm. 2. Mild atherosclerotic irregularity within the cavernous internal carotid arteries without significant stenosis. 3. Otherwise normal MRA Circle of Willis.  CT angiogram of the abdomen pelvis 11/17/2021: 1. No evidence of acute cholecystitis. 2. Two adjacent aneurysmal dilatation of the infrarenal abdominal aorta are favored to reflect saccular aneurysms and referral to a vascular specialist is recommended.  Vascular/Lymphatic: Two adjacent aneurysmal dilatation of the infrarenal abdominal aorta measure 2.5 cm and 2.2 cm in greatest dimension (series 5, image 47). There are associated atherosclerotic changes. These are favored to reflect saccular aneurysms and are slightly increased in size since 12/21/2017. No signs of impending rupture.  Cardiac Studies:   Echocardiogram: 11/08/2020:  1. Left ventricular ejection fraction, by estimation, is 65 to 70%. The left ventricle has normal function. There is mild left ventricular hypertrophy. Left ventricular diastolic parameters were normal.   2. Right ventricular systolic function is normal. The right ventricular size is normal.   3. The mitral valve is normal in structure. Trivial mitral valve regurgitation.   4. The aortic valve is normal in structure. Aortic valve regurgitation is not visualized.   Coronary CTA 12/13/2020: 1. Total coronary  calcium score of 2.37. This was 54th percentile for age and sex matched control. 2. Normal coronary origin with right dominance. 3. CAD-RADS = 1. 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 4. Aortic atherosclerosis.   RECOMMENDATIONS: Consider non-atherosclerotic causes of chest pain. Consider preventive therapy and risk factor modification  EKG:   EKG 11/08/2020: Normal sinus rhythm at rate of 84 bpm.  Normal EKG.    Assessment     ICD-10-CM   1. Infrarenal abdominal aortic aneurysm (AAA) without rupture (HCC)  I71.43     2. Preoperative cardiovascular examination  Z01.810     3. Primary hypertension  I10     4. Hypercholesteremia  E78.00        There are no discontinued medications.   No orders of the defined types were placed in this encounter.  No orders of the defined types were placed in this encounter.  Recommendations:   Deborah Peters is a 67 y.o. Caucasian female patient with history of TIA/stroke in 2021, aortic atherosclerosis, mild coronary calcification in the 52nd percentile with no significant coronary disease by CTA in 2022, hypertension, hyperlipidemia, hyperglycemia who underwent CT angiogram of the abdomen on 11/17/2021 and was found to have saccular aneurysms measuring approximately 2.5 cm felt to be high risk for rupture and has been scheduled for endovascular repair by Dr. Gae Gallop, referred to me for preoperative cardiac restratification.   1. Infrarenal abdominal aortic aneurysm (AAA) without rupture ([place]) I have personally reviewed the images of the CT angiogram of the abdomen settlers were noted.  Did not appear to be extremely large or at the risk of impending rupture but as per recommendation from vascular surgery, endovascular repair was considered to be appropriate.  2. Preoperative cardiovascular examination From cardiac standpoint, her risk factors are  well-controlled including hypertension, hyperlipidemia, hyperglycemia.  I reviewed external labs, renal function is normal, lipids under excellent control and blood pressure is also well-controlled and she is on appropriate medical therapy.  Hence overall should be considered low risk from cardiac standpoint to proceed with endovascular repair.  3. Primary hypertension Blood pressure is well-controlled on present medical regimen, I did not make any changes to her medication.  4. Hypercholesteremia She should continue with  the statins, she is presently tolerating 80 mg of Lipitor without any side effects.  I reviewed her external labs and lipids.  LFTs are normal, lipids under excellent control.  Patient would like to continue to follow-up with me, I will see her back in 6 months for follow-up.     Adrian Prows, MD, [facility] 04/14/2022, 10:55 PM Office: [phone]

## 2022-04-19 ENCOUNTER — Ambulatory Visit: Payer: PPO | Admitting: Cardiology

## 2022-04-19 ENCOUNTER — Encounter: Payer: Self-pay | Admitting: Cardiology

## 2022-04-19 VITALS — BP 120/69 | HR 86 | Resp 16 | Ht 66.0 in | Wt 146.4 lb

## 2022-04-19 DIAGNOSIS — E7800 Pure hypercholesterolemia, unspecified: Secondary | ICD-10-CM

## 2022-04-19 DIAGNOSIS — I7143 Infrarenal abdominal aortic aneurysm, without rupture: Secondary | ICD-10-CM | POA: Diagnosis not present

## 2022-04-19 DIAGNOSIS — I10 Essential (primary) hypertension: Secondary | ICD-10-CM | POA: Diagnosis not present

## 2022-04-19 DIAGNOSIS — Z01810 Encounter for preprocedural cardiovascular examination: Secondary | ICD-10-CM

## 2022-04-25 ENCOUNTER — Ambulatory Visit (HOSPITAL_COMMUNITY)
Admission: RE | Admit: 2022-04-25 | Discharge: 2022-04-25 | Disposition: A | Discharge status: Home / Self Care | Payer: PPO | Source: Ambulatory Visit | Attending: Vascular Surgery | Admitting: Vascular Surgery

## 2022-04-25 DIAGNOSIS — I7143 Infrarenal abdominal aortic aneurysm, without rupture: Secondary | ICD-10-CM | POA: Diagnosis not present

## 2022-04-25 DIAGNOSIS — I713 Abdominal aortic aneurysm, ruptured, unspecified: Secondary | ICD-10-CM | POA: Insufficient documentation

## 2022-04-25 DIAGNOSIS — E782 Mixed hyperlipidemia: Secondary | ICD-10-CM | POA: Diagnosis not present

## 2022-04-25 DIAGNOSIS — I7121 Aneurysm of the ascending aorta, without rupture: Secondary | ICD-10-CM | POA: Diagnosis not present

## 2022-04-25 LAB — POCT I-STAT CREATININE: Creatinine, Ser: 0.7 mg/dL (ref 0.44–1.00)

## 2022-04-25 MED ORDER — IOHEXOL 350 MG/ML SOLN
100.0000 mL | Freq: Once | INTRAVENOUS | Status: AC | PRN
Start: 2022-04-25 — End: 2022-04-25
  Administered 2022-04-25: 100 mL via INTRAVENOUS

## 2022-04-27 ENCOUNTER — Ambulatory Visit (INDEPENDENT_AMBULATORY_CARE_PROVIDER_SITE_OTHER): Payer: PPO | Admitting: Vascular Surgery

## 2022-04-27 ENCOUNTER — Encounter: Payer: Self-pay | Admitting: Vascular Surgery

## 2022-04-27 VITALS — BP 127/78 | HR 77 | Temp 98.2°F | Resp 20 | Ht 66.0 in | Wt 147.0 lb

## 2022-04-27 DIAGNOSIS — I7143 Infrarenal abdominal aortic aneurysm, without rupture: Secondary | ICD-10-CM | POA: Diagnosis not present

## 2022-04-27 DIAGNOSIS — I719 Aortic aneurysm of unspecified site, without rupture: Secondary | ICD-10-CM

## 2022-04-27 HISTORY — DX: Aortic aneurysm of unspecified site, without rupture: I71.9

## 2022-04-27 NOTE — Progress Notes (Addendum)
REASON FOR VISIT:   Follow-up of 2 saccular aneurysms involving the infrarenal aorta  MEDICAL ISSUES:   2 SACCULAR AORTIC ANEURYSMS INFRARENAL ABDOMINAL AORTA: The patient does appear to be a good candidate for endovascular repair of her 2 saccular aneurysms.  This would involve placement of 2 Gore 20 mm x 4-1/2 cm cuffs.  There is not enough room for a standard bifurcated graft as the contralateral gate would not open adequately for cannulation.  I have discussed the indications for aneurysm repair. I have explained that without repair, the risk of the aneurysm rupturing is 5-10% per year. We have discussed the advantages and disadvantages of open versus endovascular repair. The patient wishes to proceed with endovascular aneurysm repair (EVAR). I have discussed the potential complications of EVAR, including, but not limited to: bleeding, infection, arterial injury, graft migration, endoleak, renal failure, MI or other unpredictable medical problems. We have discussed the possibility of having to convert to open repair. We also discussed the need for continued lifelong follow-up after EVAR. All of the patients questions were answered and they are agreeable to proceed with surgery.   She has undergone preoperative cardiac clearance by Dr. Einar Gip.  Will try to schedule her surgery in the near future.  HPI:   Deborah Peters is a pleasant 67 y.o. female who I saw on 05/14/2022 with 2 saccular aneurysms of her infrarenal aorta.  Her CT scan was not up-to-date and did not have thin cuts and I set her up for a CT angio of the abdomen and pelvis.  Since I saw her last she denies any abdominal pain or back pain.  She has no history of claudication.  She has been evaluated by Dr. Einar Gip who noted that from a cardiac standpoint her risk factors are well-controlled and overall she would be low risk from a cardiac standpoint 0.4 endovascular aneurysm repair.  Past Medical History:  Diagnosis Date    Allergy    Anxiety    Chronic back pain    Constipation due to opioid therapy    COVID    1st one was severe, no hospitalization, 2nd and 3rd were mild   DDD (degenerative disc disease), lumbar    Depression    Diabetes mellitus, type 2 ([place])    Fatty liver    GERD (gastroesophageal reflux disease)    past hx of    Gestational diabetes    no meds as of 06-01-2016.  diet controlled   Heart murmur    Heart murmur    Hyperlipidemia    Hypertension    pt this pain related - id occasionally high    IFG (impaired fasting glucose)    Meralgia paraesthetica    Migraines    no longer has migraines   Pneumonia    Post-menopausal    Scoliosis    Stroke [facility])    very mild right side weakness (right hand, shoulder)   Ulcer    years ago. gastric ulcer.    Family History  Problem Relation Age of Onset   Stroke Mother    Heart failure Father        Triple bypass   Melanoma Father    Colon polyps Sister    Colon polyps Brother    Cancer Maternal Grandmother    Cancer Maternal Grandfather    Stomach cancer Paternal Grandfather    Esophageal cancer Neg Hx    Rectal cancer Neg Hx    Colon cancer Neg Hx  Pancreatic cancer Neg Hx     SOCIAL HISTORY: The patient is not a smoker   Allergies  Allergen Reactions   Sulfa Antibiotics Anaphylaxis   Amoxicillin-Pot Clavulanate Other (See Comments)    Unknown   Atorvastatin Other (See Comments)     felt terrible on it.  flu like.,    Ephedrine-Guaifenesin Other (See Comments)    Primatene/ unknown      Penicillin G Sodium Other (See Comments)    Unknown   Simvastatin     Other reaction(s): felt terrible, Other    Current Outpatient Medications  Medication Sig Dispense Refill   acetaminophen (TYLENOL) 650 MG CR tablet Take 1,300 mg by mouth every 8 (eight) hours as needed for pain.     ascorbic acid (VITAMIN C) 500 MG tablet Take 500 mg by mouth daily.     b complex vitamins capsule Take 1 capsule by mouth daily. Raw  B  complex     benzonatate (TESSALON) 100 MG capsule Take 100 mg by mouth 3 (three) times daily as needed for cough.     cetirizine (ZYRTEC) 10 MG tablet Take 10 mg by mouth daily.     clopidogrel (PLAVIX) 75 MG tablet Take 75 mg by mouth daily.     Coenzyme Q10 (CO Q-10) 100 MG CAPS Take 100 mg by mouth daily.     diazepam (VALIUM) 5 MG tablet Take 5 mg by mouth 3 (three) times daily.     donepezil (ARICEPT) 5 MG tablet Take 5 mg by mouth at bedtime.     DULoxetine (CYMBALTA) 60 MG capsule Take 60 mg by mouth daily.     ezetimibe (ZETIA) 10 MG tablet Take 10 mg by mouth daily.  3   gabapentin (NEURONTIN) 400 MG capsule Take 400-800 mg by mouth See admin instructions. Take 400 mg in the morning and and afternoon and 800 mg at bedtidme     losartan (COZAAR) 25 MG tablet Take 50 mg by mouth every evening.     Magnesium 250 MG TABS Take 250 mg by mouth daily.     OMEGA-3 FATTY ACIDS PO Take 2,400 mg by mouth 2 (two) times daily. 1200 mg each     OVER THE COUNTER MEDICATION Take 1 capsule by mouth daily. Tumeric     OVER THE COUNTER MEDICATION Take 1,000 mg by mouth daily. Cinnamon daily     OVER THE COUNTER MEDICATION Take 1 tablet by mouth at bedtime. alteril natural sleep aide at bedtime     promethazine (PHENERGAN) 25 MG tablet Take 25 mg by mouth 2 (two) times daily as needed for nausea or vomiting.     Taurine 1000 MG CAPS Take 1,000 mg by mouth in the morning and at bedtime.     vitamin D3 (CHOLECALCIFEROL) 25 MCG tablet Take 1,000 Units by mouth daily.     zolpidem (AMBIEN CR) 6.25 MG CR tablet Take 6.25 mg by mouth at bedtime as needed for sleep.  5   atorvastatin (LIPITOR) 80 MG tablet Take 1 tablet (80 mg total) by mouth at bedtime. 30 tablet 1   No current facility-administered medications for this visit.    REVIEW OF SYSTEMS:  [X]$  denotes positive finding, [ ]$  denotes negative finding Cardiac  Comments:  Chest pain or chest pressure:    Shortness of breath upon exertion: x   Short  of breath when lying flat:    Irregular heart rhythm:        Vascular  Pain in calf, thigh, or hip brought on by ambulation:    Pain in feet at night that wakes you up from your sleep:     Blood clot in your veins:    Leg swelling:         Pulmonary    Oxygen at home:    Productive cough:     Wheezing:         Neurologic    Sudden weakness in arms or legs:     Sudden numbness in arms or legs:     Sudden onset of difficulty speaking or slurred speech:    Temporary loss of vision in one eye:     Problems with dizziness:         Gastrointestinal    Blood in stool:     Vomited blood:         Genitourinary    Burning when urinating:     Blood in urine:        Psychiatric    Major depression:         Hematologic    Bleeding problems:    Problems with blood clotting too easily:        Skin    Rashes or ulcers:        Constitutional    Fever or chills:     PHYSICAL EXAM:   Vitals:   04/27/22 1308  BP: 127/78  Pulse: 77  Resp: 20  Temp: 98.2 F (36.8 C)  SpO2: 99%  Weight: 147 lb (66.7 kg)  Height: 5' 6"$  (1.676 m)    GENERAL: The patient is a well-nourished female, in no acute distress. The vital signs are documented above. CARDIAC: There is a regular rate and rhythm.  VASCULAR: I do not detect carotid bruits. She has palpable femoral, popliteal, and posterior tibial pulses bilaterally. PULMONARY: There is good air exchange bilaterally without wheezing or rales. ABDOMEN: Soft and non-tender with normal pitched bowel sounds.  MUSCULOSKELETAL: There are no major deformities or cyanosis. NEUROLOGIC: No focal weakness or paresthesias are detected. SKIN: There are no ulcers or rashes noted. PSYCHIATRIC: The patient has a normal affect.  DATA:    CT ANGIO: I have reviewed her CT angiogram that was done on 04/25/2022.  She has 2 saccular aneurysms originating off the left lateral aspect of the infrarenal aorta.  Based on my review of the films, she does appear to  be a good candidate for endovascular repair of the 2 saccular aneurysms.  Given the size of the aorta we would not be able to use a standard bifurcated graft.  However using 220 mm x 4-1/2 cm cuffs I think we could cover the area of concern.  LABS: Her creatinine on 04/25/2022 was 0.7.  [name] [facility] [phone]

## 2022-04-28 ENCOUNTER — Other Ambulatory Visit: Payer: Self-pay

## 2022-04-28 ENCOUNTER — Telehealth: Payer: Self-pay

## 2022-04-28 DIAGNOSIS — I7143 Infrarenal abdominal aortic aneurysm, without rupture: Secondary | ICD-10-CM

## 2022-04-28 NOTE — Telephone Encounter (Signed)
Patient returned call. Surgery scheduled for 05/12/22. Instructions reviewed and patient verbalized understanding.

## 2022-04-28 NOTE — Telephone Encounter (Signed)
Attempted to reach patient to schedule EVAR surgery, but no answer. Left message for patient to return call.

## 2022-05-04 NOTE — Progress Notes (Signed)
Surgical Instructions    Your procedure is scheduled on Friday, 05/12/22.  Report to [facility] Main Entrance "A" at 5:30 A.M., then check in with the Admitting office.  Call this number if you have problems the morning of surgery:  [phone]   If you have any questions prior to your surgery date call [phone]: Open Monday-Friday 8am-4pm If you experience any cold or flu symptoms such as cough, fever, chills, shortness of breath, etc. between now and your scheduled surgery, please notify us at the above number     Remember:  Do not eat or drink after midnight the night before your surgery     Take these medicines the morning of surgery with A SIP OF WATER:  cetirizine (ZYRTEC)  diazepam (VALIUM)  DULoxetine (CYMBALTA)  ezetimibe (ZETIA)  gabapentin (NEURONTIN)   IF NEEDED: acetaminophen (TYLENOL)  benzonatate (TESSALON)  promethazine (PHENERGAN)   As of today, STOP taking any Aspirin (unless otherwise instructed by your surgeon) Aleve, Naproxen, Ibuprofen, Motrin, Advil, Goody's, BC's, all herbal medications, fish oil, and all vitamins.  Please stop clopidogrel (PLAVIX) 5 days prior to surgery. Your last dose will be 05/06/22.  WHAT DO I DO ABOUT MY DIABETES MEDICATION?   Do not take oral diabetes medicines (pills) the morning of surgery.  The day of surgery, do not take other diabetes injectables, including Byetta (exenatide), Bydureon (exenatide ER), Victoza (liraglutide), or Trulicity (dulaglutide).  If your CBG is greater than 220 mg/dL, you may take  of your sliding scale (correction) dose of insulin.   HOW TO MANAGE YOUR DIABETES BEFORE AND AFTER SURGERY  Why is it important to control my blood sugar before and after surgery? Improving blood sugar levels before and after surgery helps healing and can limit problems. A way of improving blood sugar control is eating a healthy diet by:  Eating less sugar and carbohydrates  Increasing activity/exercise   Talking with your doctor about reaching your blood sugar goals High blood sugars (greater than 180 mg/dL) can raise your risk of infections and slow your recovery, so you will need to focus on controlling your diabetes during the weeks before surgery. Make sure that the doctor who takes care of your diabetes knows about your planned surgery including the date and location.  How do I manage my blood sugar before surgery? Check your blood sugar at least 4 times a day, starting 2 days before surgery, to make sure that the level is not too high or low.  Check your blood sugar the morning of your surgery when you wake up and every 2 hours until you get to the Short Stay unit.  If your blood sugar is less than 70 mg/dL, you will need to treat for low blood sugar: Do not take insulin. Treat a low blood sugar (less than 70 mg/dL) with  cup of clear juice (cranberry or apple), 4 glucose tablets, OR glucose gel. Recheck blood sugar in 15 minutes after treatment (to make sure it is greater than 70 mg/dL). If your blood sugar is not greater than 70 mg/dL on recheck, call [phone] for further instructions. Report your blood sugar to the short stay nurse when you get to Short Stay.  If you are admitted to the hospital after surgery: Your blood sugar will be checked by the staff and you will probably be given insulin after surgery (instead of oral diabetes medicines) to make sure you have good blood sugar levels. The goal for blood sugar control after surgery is 80-180  mg/dL.           Do not wear jewelry or makeup. Do not wear lotions, powders, perfumes or deodorant. Do not shave 48 hours prior to surgery.   Do not bring valuables to the hospital. Do not wear nail polish, gel polish, artificial nails, or any other type of covering on natural nails (fingers and toes) If you have artificial nails or gel coating that need to be removed by a nail salon, please have this removed prior to surgery.  Artificial nails or gel coating may interfere with anesthesia's ability to adequately monitor your vital signs.  [facility] is not responsible for any belongings or valuables.    Do NOT Smoke (Tobacco/Vaping)  24 hours prior to your procedure  If you use a CPAP at night, you may bring your mask for your overnight stay.   Contacts, glasses, hearing aids, dentures or partials may not be worn into surgery, please bring cases for these belongings   For patients admitted to the hospital, discharge time will be determined by your treatment team.   Patients discharged the day of surgery will not be allowed to drive home, and someone needs to stay with them for 24 hours.   SURGICAL WAITING ROOM VISITATION Patients having surgery or a procedure may have no more than 2 support people in the waiting area - these visitors may rotate.   Children under the age of 64 must have an adult with them who is not the patient. If the patient needs to stay at the hospital during part of their recovery, the visitor guidelines for inpatient rooms apply. Pre-op nurse will coordinate an appropriate time for 1 support person to accompany patient in pre-op.  This support person may not rotate.   Please refer to [email or web address] for the visitor guidelines for Inpatients (after your surgery is over and you are in a regular room).    Special instructions:    Oral Hygiene is also important to reduce your risk of infection.  Remember - BRUSH YOUR TEETH THE MORNING OF SURGERY WITH YOUR REGULAR TOOTHPASTE   [place]- Preparing For Surgery  Before surgery, you can play an important role. Because skin is not sterile, your skin needs to be as free of germs as possible. You can reduce the number of germs on your skin by washing with CHG (chlorahexidine gluconate) Soap before surgery.  CHG is an antiseptic cleaner which kills germs and bonds with the skin to continue  killing germs even after washing.     Please do not use if you have an allergy to CHG or antibacterial soaps. If your skin becomes reddened/irritated stop using the CHG.  Do not shave (including legs and underarms) for at least 48 hours prior to first CHG shower. It is OK to shave your face.  Please follow these instructions carefully.     Shower the NIGHT BEFORE SURGERY and the MORNING OF SURGERY with CHG Soap.   If you chose to wash your hair, wash your hair first as usual with your normal shampoo. After you shampoo, rinse your hair and body thoroughly to remove the shampoo.  Then [employer] and genitals (private parts) with your normal soap and rinse thoroughly to remove soap.  After that Use CHG Soap as you would any other liquid soap. You can apply CHG directly to the skin and wash gently with a scrungie or a clean washcloth.   Apply the CHG Soap to your body ONLY FROM THE NECK  DOWN.  Do not use on open wounds or open sores. Avoid contact with your eyes, ears, mouth and genitals (private parts). Wash Face and genitals (private parts)  with your normal soap.   Wash thoroughly, paying special attention to the area where your surgery will be performed.  Thoroughly rinse your body with warm water from the neck down.  DO NOT shower/wash with your normal soap after using and rinsing off the CHG Soap.  Pat yourself dry with a CLEAN TOWEL.  Wear CLEAN PAJAMAS to bed the night before surgery  Place CLEAN SHEETS on your bed the night before your surgery  DO NOT SLEEP WITH PETS.   Day of Surgery: Take a shower with CHG soap. Wear Clean/Comfortable clothing the morning of surgery Do not apply any deodorants/lotions.   Remember to brush your teeth WITH YOUR REGULAR TOOTHPASTE.    If you received a COVID test during your pre-op visit, it is requested that you wear a mask when out in public, stay away from anyone that may not be feeling well, and notify your surgeon if you develop  symptoms. If you have been in contact with anyone that has tested positive in the last 10 days, please notify your surgeon.    Please read over the following fact sheets that you were given.

## 2022-05-05 ENCOUNTER — Telehealth: Payer: Self-pay

## 2022-05-05 ENCOUNTER — Inpatient Hospital Stay (HOSPITAL_COMMUNITY)
Admission: RE | Admit: 2022-05-05 | Discharge: 2022-05-05 | Disposition: A | Discharge status: Home / Self Care | Payer: PPO | Source: Ambulatory Visit

## 2022-05-05 NOTE — Telephone Encounter (Signed)
Patient contacted office reporting cold s/s for the past week, described as "a head cold, congestion, sore throat and fatigue." She reports having a T-99.6 after taking Tylenol. Home covid test negative. Patient cancelled her PAT visit on today for upcoming EVAR surgery on 05/12/22. Patient would like to see if she begins to feel better and if able to proceed with surgery, then contact our office back on Monday with update. Patient made aware if congestion/fever symptoms continue or worsen surgery would need to be rescheduled. Patient verbalized understanding. Will make Dr. Scot Dock aware.

## 2022-05-08 ENCOUNTER — Telehealth: Payer: Self-pay

## 2022-05-08 NOTE — Telephone Encounter (Signed)
Pt called to let us know she is feeling better and can proceed with surgery later this week. She needs to r/s her PAT appt. I have left Edd Fabian a message at PAT scheduling with this information, as well as given the pt the direct number to PAT scheduling.

## 2022-05-10 ENCOUNTER — Encounter (HOSPITAL_COMMUNITY): Payer: Self-pay

## 2022-05-10 ENCOUNTER — Other Ambulatory Visit: Payer: Self-pay

## 2022-05-10 ENCOUNTER — Encounter (HOSPITAL_COMMUNITY)
Admission: RE | Admit: 2022-05-10 | Discharge: 2022-05-10 | Disposition: A | Discharge status: Home / Self Care | Payer: PPO | Source: Ambulatory Visit | Attending: Vascular Surgery | Admitting: Vascular Surgery

## 2022-05-10 VITALS — BP 137/71 | HR 78 | Temp 97.4°F | Resp 16 | Ht 65.0 in | Wt 144.1 lb

## 2022-05-10 DIAGNOSIS — I7143 Infrarenal abdominal aortic aneurysm, without rupture: Secondary | ICD-10-CM

## 2022-05-10 DIAGNOSIS — Z880 Allergy status to penicillin: Secondary | ICD-10-CM | POA: Diagnosis not present

## 2022-05-10 DIAGNOSIS — Z8249 Family history of ischemic heart disease and other diseases of the circulatory system: Secondary | ICD-10-CM | POA: Diagnosis not present

## 2022-05-10 DIAGNOSIS — Z01818 Encounter for other preprocedural examination: Secondary | ICD-10-CM

## 2022-05-10 DIAGNOSIS — I2510 Atherosclerotic heart disease of native coronary artery without angina pectoris: Secondary | ICD-10-CM | POA: Diagnosis present

## 2022-05-10 DIAGNOSIS — E1151 Type 2 diabetes mellitus with diabetic peripheral angiopathy without gangrene: Secondary | ICD-10-CM | POA: Diagnosis not present

## 2022-05-10 DIAGNOSIS — G8929 Other chronic pain: Secondary | ICD-10-CM | POA: Diagnosis present

## 2022-05-10 DIAGNOSIS — F32A Depression, unspecified: Secondary | ICD-10-CM | POA: Diagnosis present

## 2022-05-10 DIAGNOSIS — E785 Hyperlipidemia, unspecified: Secondary | ICD-10-CM | POA: Diagnosis present

## 2022-05-10 DIAGNOSIS — I4519 Other right bundle-branch block: Secondary | ICD-10-CM | POA: Insufficient documentation

## 2022-05-10 DIAGNOSIS — I10 Essential (primary) hypertension: Secondary | ICD-10-CM | POA: Diagnosis present

## 2022-05-10 DIAGNOSIS — Z881 Allergy status to other antibiotic agents status: Secondary | ICD-10-CM | POA: Diagnosis not present

## 2022-05-10 DIAGNOSIS — Z8673 Personal history of transient ischemic attack (TIA), and cerebral infarction without residual deficits: Secondary | ICD-10-CM | POA: Insufficient documentation

## 2022-05-10 DIAGNOSIS — Z7902 Long term (current) use of antithrombotics/antiplatelets: Secondary | ICD-10-CM | POA: Diagnosis not present

## 2022-05-10 DIAGNOSIS — Z823 Family history of stroke: Secondary | ICD-10-CM | POA: Diagnosis not present

## 2022-05-10 DIAGNOSIS — E119 Type 2 diabetes mellitus without complications: Secondary | ICD-10-CM | POA: Diagnosis present

## 2022-05-10 DIAGNOSIS — Z87891 Personal history of nicotine dependence: Secondary | ICD-10-CM | POA: Diagnosis not present

## 2022-05-10 DIAGNOSIS — I700 Atherosclerosis of aorta: Secondary | ICD-10-CM | POA: Diagnosis present

## 2022-05-10 DIAGNOSIS — Z888 Allergy status to other drugs, medicaments and biological substances status: Secondary | ICD-10-CM | POA: Diagnosis not present

## 2022-05-10 DIAGNOSIS — I69351 Hemiplegia and hemiparesis following cerebral infarction affecting right dominant side: Secondary | ICD-10-CM | POA: Diagnosis not present

## 2022-05-10 DIAGNOSIS — K760 Fatty (change of) liver, not elsewhere classified: Secondary | ICD-10-CM | POA: Diagnosis present

## 2022-05-10 DIAGNOSIS — M549 Dorsalgia, unspecified: Secondary | ICD-10-CM | POA: Diagnosis present

## 2022-05-10 DIAGNOSIS — Z8616 Personal history of COVID-19: Secondary | ICD-10-CM | POA: Diagnosis not present

## 2022-05-10 DIAGNOSIS — Z79899 Other long term (current) drug therapy: Secondary | ICD-10-CM | POA: Diagnosis not present

## 2022-05-10 DIAGNOSIS — Z882 Allergy status to sulfonamides status: Secondary | ICD-10-CM | POA: Diagnosis not present

## 2022-05-10 DIAGNOSIS — F419 Anxiety disorder, unspecified: Secondary | ICD-10-CM | POA: Diagnosis present

## 2022-05-10 DIAGNOSIS — Z01812 Encounter for preprocedural laboratory examination: Secondary | ICD-10-CM | POA: Diagnosis not present

## 2022-05-10 DIAGNOSIS — Z9049 Acquired absence of other specified parts of digestive tract: Secondary | ICD-10-CM | POA: Diagnosis not present

## 2022-05-10 DIAGNOSIS — I714 Abdominal aortic aneurysm, without rupture, unspecified: Secondary | ICD-10-CM | POA: Diagnosis present

## 2022-05-10 HISTORY — DX: Peripheral vascular disease, unspecified: I73.9

## 2022-05-10 LAB — APTT: aPTT: 31 seconds (ref 24–36)

## 2022-05-10 LAB — COMPREHENSIVE METABOLIC PANEL
ALT: 18 U/L (ref 0–44)
AST: 20 U/L (ref 15–41)
Albumin: 4.1 g/dL (ref 3.5–5.0)
Alkaline Phosphatase: 59 U/L (ref 38–126)
Anion gap: 9 (ref 5–15)
BUN: 9 mg/dL (ref 8–23)
CO2: 25 mmol/L (ref 22–32)
Calcium: 9.6 mg/dL (ref 8.9–10.3)
Chloride: 104 mmol/L (ref 98–111)
Creatinine, Ser: 0.6 mg/dL (ref 0.44–1.00)
GFR, Estimated: >60 mL/min (ref >60)
Glucose, Bld: 105 mg/dL — ABNORMAL HIGH (ref 70–99)
Potassium: 3.6 mmol/L (ref 3.5–5.1)
Sodium: 138 mmol/L (ref 135–145)
Total Bilirubin: 0.6 mg/dL (ref 0.3–1.2)
Total Protein: 6.9 g/dL (ref 6.5–8.1)

## 2022-05-10 LAB — URINALYSIS, ROUTINE W REFLEX MICROSCOPIC
Bilirubin Urine: NEGATIVE
Glucose, UA: NEGATIVE mg/dL
Hgb urine dipstick: NEGATIVE
Ketones, ur: NEGATIVE mg/dL
Leukocytes,Ua: NEGATIVE
Nitrite: NEGATIVE
Protein, ur: NEGATIVE mg/dL
Specific Gravity, Urine: 1.006 (ref 1.005–1.030)
pH: 5 (ref 5.0–8.0)

## 2022-05-10 LAB — CBC
HCT: 39.1 % (ref 36.0–46.0)
Hemoglobin: 12.6 g/dL (ref 12.0–15.0)
MCH: 31.9 pg (ref 26.0–34.0)
MCHC: 32.2 g/dL (ref 30.0–36.0)
MCV: 99 fL (ref 80.0–100.0)
Platelets: 275 10*3/uL (ref 150–400)
RBC: 3.95 MIL/uL (ref 3.87–5.11)
RDW: 13 % (ref 11.5–15.5)
WBC: 9.5 10*3/uL (ref 4.0–10.5)
nRBC: 0 % (ref 0.0–0.2)

## 2022-05-10 LAB — PROTIME-INR
INR: 1 (ref 0.8–1.2)
Prothrombin Time: 13.2 seconds (ref 11.4–15.2)

## 2022-05-10 LAB — SURGICAL PCR SCREEN
MRSA, PCR: NEGATIVE
Staphylococcus aureus: NEGATIVE

## 2022-05-10 NOTE — Progress Notes (Signed)
PCP - Dr. Crist Infante Cardiologist - Dr. Adrian Prows  PPM/ICD - Denies Device Orders - n/a Rep Notified - n/a  Chest x-ray - n/a EKG - 04/19/2022 Stress Test - Denies ECHO - 11/08/2020 Cardiac Cath - Denies  Sleep Study - Denies CPAP - n/a  No DM  Last dose of GLP1 agonist- n/a GLP1 instructions: n/a  Blood Thinner Instructions: Per pt, Dr. Scot Dock instructed to continue to take Plavix. This RN instructed pt to reach out to Dr. Nicole Cella office to verify more specifically if she can take medication DOS. Aspirin Instructions: n/a  NPO after midnight  COVID TEST- n/a   Anesthesia review: Yes. Cardiac Clearance 04/19/2022  Patient denies shortness of breath, fever, cough and chest pain at PAT appointment   All instructions explained to the patient, with a verbal understanding of the material. Patient agrees to go over the instructions while at home for a better understanding. Patient also instructed to self quarantine after being tested for COVID-19. The opportunity to ask questions was provided.

## 2022-05-10 NOTE — Progress Notes (Signed)
Pharmacy Tech unable to complete Med Rec during PAT appointment. Pt given Pharmacy Call Center phone number [phone]) and instructed to call when she got home and verify medications that she was taking with pharmacist. Pt understood instructions.

## 2022-05-10 NOTE — Pre-Procedure Instructions (Signed)
Surgical Instructions    Your procedure is scheduled on May 12, 2022.  Report to [facility] Main Entrance "A" at 5:30 A.M., then check in with the Admitting office.  Call this number if you have problems the morning of surgery:  [phone]  If you have any questions prior to your surgery date call [phone]: Open Monday-Friday 8am-4pm If you experience any cold or flu symptoms such as cough, fever, chills, shortness of breath, etc. between now and your scheduled surgery, please notify us at the above number.     Remember:  Do not eat or drink after midnight the night before your surgery      Take these medicines the morning of surgery with A SIP OF WATER:  cetirizine (ZYRTEC)   diazepam (VALIUM)   DULoxetine (CYMBALTA)   ezetimibe (ZETIA)   gabapentin (NEURONTIN)     May take these medications IF NEEDED:  acetaminophen (TYLENOL)   promethazine (PHENERGAN)   benzonatate (TESSALON)    Follow your surgeon's instructions on when to stop clopidogrel (PLAVIX).  If no instructions were given by your surgeon then you will need to call the office to get those instructions.     As of today, STOP taking any Aspirin (unless otherwise instructed by your surgeon) Aleve, Naproxen, Ibuprofen, Motrin, Advil, Goody's, BC's, all herbal medications, fish oil, and all vitamins.           HOW TO MANAGE YOUR DIABETES BEFORE AND AFTER SURGERY  Why is it important to control my blood sugar before and after surgery? Improving blood sugar levels before and after surgery helps healing and can limit problems. A way of improving blood sugar control is eating a healthy diet by:  Eating less sugar and carbohydrates  Increasing activity/exercise  Talking with your doctor about reaching your blood sugar goals High blood sugars (greater than 180 mg/dL) can raise your risk of infections and slow your recovery, so you will need to focus on controlling your diabetes during the weeks before  surgery. Make sure that the doctor who takes care of your diabetes knows about your planned surgery including the date and location.  How do I manage my blood sugar before surgery? Check your blood sugar at least 4 times a day, starting 2 days before surgery, to make sure that the level is not too high or low.  Check your blood sugar the morning of your surgery when you wake up and every 2 hours until you get to the Short Stay unit.  If your blood sugar is less than 70 mg/dL, you will need to treat for low blood sugar: Do not take insulin. Treat a low blood sugar (less than 70 mg/dL) with  cup of clear juice (cranberry or apple), 4 glucose tablets, OR glucose gel. Recheck blood sugar in 15 minutes after treatment (to make sure it is greater than 70 mg/dL). If your blood sugar is not greater than 70 mg/dL on recheck, call [phone] for further instructions. Report your blood sugar to the short stay nurse when you get to Short Stay.  If you are admitted to the hospital after surgery: Your blood sugar will be checked by the staff and you will probably be given insulin after surgery (instead of oral diabetes medicines) to make sure you have good blood sugar levels. The goal for blood sugar control after surgery is 80-180 mg/dL.             Do NOT Smoke (Tobacco/Vaping) for 24 hours prior to your  procedure.  If you use a CPAP at night, you may bring your mask/headgear for your overnight stay.   Contacts, glasses, piercing's, hearing aid's, dentures or partials may not be worn into surgery, please bring cases for these belongings.    For patients admitted to the hospital, discharge time will be determined by your treatment team.   Patients discharged the day of surgery will not be allowed to drive home, and someone needs to stay with them for 24 hours.  SURGICAL WAITING ROOM VISITATION Patients having surgery or a procedure may have no more than 2 support people in the waiting area -  these visitors may rotate.   Children under the age of 102 must have an adult with them who is not the patient. If the patient needs to stay at the hospital during part of their recovery, the visitor guidelines for inpatient rooms apply. Pre-op nurse will coordinate an appropriate time for 1 support person to accompany patient in pre-op.  This support person may not rotate.   Please refer to the [facility] website for the visitor guidelines for Inpatients (after your surgery is over and you are in a regular room).    Special instructions:   [place]- Preparing For Surgery  Before surgery, you can play an important role. Because skin is not sterile, your skin needs to be as free of germs as possible. You can reduce the number of germs on your skin by washing with CHG (chlorahexidine gluconate) Soap before surgery.  CHG is an antiseptic cleaner which kills germs and bonds with the skin to continue killing germs even after washing.    Oral Hygiene is also important to reduce your risk of infection.  Remember - BRUSH YOUR TEETH THE MORNING OF SURGERY WITH YOUR REGULAR TOOTHPASTE  Please do not use if you have an allergy to CHG or antibacterial soaps. If your skin becomes reddened/irritated stop using the CHG.  Do not shave (including legs and underarms) for at least 48 hours prior to first CHG shower. It is OK to shave your face.  Please follow these instructions carefully.   Shower the NIGHT BEFORE SURGERY and the MORNING OF SURGERY  If you chose to wash your hair, wash your hair first as usual with your normal shampoo.  After you shampoo, rinse your hair and body thoroughly to remove the shampoo.  Use CHG Soap as you would any other liquid soap. You can apply CHG directly to the skin and wash gently with a scrungie or a clean washcloth.   Apply the CHG Soap to your body ONLY FROM THE NECK DOWN.  Do not use on open wounds or open sores. Avoid contact with your eyes, ears, mouth and  genitals (private parts). Wash Face and genitals (private parts)  with your normal soap.   Wash thoroughly, paying special attention to the area where your surgery will be performed.  Thoroughly rinse your body with warm water from the neck down.  DO NOT shower/wash with your normal soap after using and rinsing off the CHG Soap.  Pat yourself dry with a CLEAN TOWEL.  Wear CLEAN PAJAMAS to bed the night before surgery  Place CLEAN SHEETS on your bed the night before your surgery  DO NOT SLEEP WITH PETS.   Day of Surgery: Take a shower with CHG soap. Do not wear jewelry or makeup Do not wear lotions, powders, perfumes/colognes, or deodorant. Do not shave 48 hours prior to surgery.   Do not bring valuables  to the hospital.  [facility] is not responsible for any belongings or valuables. Do not wear nail polish, gel polish, artificial nails, or any other type of covering on natural nails (fingers and toes) If you have artificial nails or gel coating that need to be removed by a nail salon, please have this removed prior to surgery. Artificial nails or gel coating may interfere with anesthesia's ability to adequately monitor your vital signs.  Wear Clean/Comfortable clothing the morning of surgery Remember to brush your teeth WITH YOUR REGULAR TOOTHPASTE.   Please read over the following fact sheets that you were given.    If you received a COVID test during your pre-op visit  it is requested that you wear a mask when out in public, stay away from anyone that may not be feeling well and notify your surgeon if you develop symptoms. If you have been in contact with anyone that has tested positive in the last 10 days please notify you surgeon.

## 2022-05-11 NOTE — Progress Notes (Signed)
Anesthesia Chart Review:  Patient is a 67 year old female with pertinent history including TIA/stroke in 2021, aortic atherosclerosis, mild coronary calcification in the 52nd percentile with no significant coronary disease by CTA in 2022, HTN, HLD.  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 Scot Dock.  She was referred to Dr. Einar Gip for preop cardiovascular restratification prior to surgery.  Per note 04/19/2022, "Preoperative cardiovascular examination From cardiac standpoint, her risk factors are well-controlled including hypertension, hyperlipidemia, hyperglycemia.  I reviewed external labs, renal function is normal, lipids under excellent control and blood pressure is also well-controlled and she is on appropriate medical therapy.  Hence overall should be considered low risk from cardiac standpoint to proceed with endovascular repair."  Diet controlled DM2, last A1c 5.8 on 11/10/2021.  Preop labs reviewed, WNL.  EKG 04/19/2022: NSR.  Rate 85.  Incomplete right bundle branch block.  Coronary CT 12/13/2020: IMPRESSION: 1. Total coronary calcium score of 2.37. This was 54th percentile for age and sex matched control.   2. Normal coronary origin with right dominance.   3. CAD-RADS = 1. 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   4. Aortic atherosclerosis.   RECOMMENDATIONS:   Consider non-atherosclerotic causes of chest pain. Consider preventive therapy and risk factor modification.  TTE 11/08/2020:  1. Left ventricular ejection fraction, by estimation, is 65 to 70%. The  left ventricle has normal function. There is mild left ventricular  hypertrophy. Left ventricular diastolic parameters were normal.   2. Right ventricular systolic function  is normal. The right ventricular  size is normal.   3. The mitral valve is normal in structure. Trivial mitral valve  regurgitation.   4. The aortic valve is normal in structure. Aortic valve regurgitation is  not visualized.     Wynonia Musty [facility] Short Stay Center/Anesthesiology Phone [phone] 05/11/2022 9:09 AM

## 2022-05-11 NOTE — Anesthesia Preprocedure Evaluation (Addendum)
Anesthesia Evaluation  Patient identified by MRN, date of birth, ID band Patient awake    Reviewed: Allergy & Precautions, NPO status , Patient's Chart, lab work & pertinent test results  Airway Mallampati: II  TM Distance: >3 FB Neck ROM: Full    Dental  (+) Teeth Intact, Dental Advisory Given, Caps, Missing   Pulmonary Patient abstained from smoking., former smoker   Pulmonary exam normal breath sounds clear to auscultation       Cardiovascular hypertension, Pt. on medications (-) angina + Peripheral Vascular Disease (nfrarenal abdominal aortic aneurysm without rupture)  (-) Past MI Normal cardiovascular exam Rhythm:Regular Rate:Normal     Neuro/Psych  Headaches PSYCHIATRIC DISORDERS Anxiety Depression    CVA, Residual Symptoms    GI/Hepatic Neg liver ROS,GERD  ,,  Endo/Other  diabetes, Well Controlled, Type 2    Renal/GU negative Renal ROS     Musculoskeletal  (+) Arthritis ,    Abdominal   Peds  Hematology  (+) Blood dyscrasia (Plavix)   Anesthesia Other Findings   Reproductive/Obstetrics                             Anesthesia Physical Anesthesia Plan  ASA: 4  Anesthesia Plan: General   Post-op Pain Management: Tylenol PO (pre-op)*   Induction: Intravenous  PONV Risk Score and Plan: 3 and Midazolam, Dexamethasone and Ondansetron  Airway Management Planned: Oral ETT  Additional Equipment: Arterial line  Intra-op Plan:   Post-operative Plan: Extubation in OR  Informed Consent: I have reviewed the patients History and Physical, chart, labs and discussed the procedure including the risks, benefits and alternatives for the proposed anesthesia with the patient or authorized representative who has indicated his/her understanding and acceptance.     Dental advisory given  Plan Discussed with: CRNA  Anesthesia Plan Comments: (2 large bore PIV  PAT note by Karoline Caldwell,  PA-C:  Patient is a 67 year old female with pertinent history including TIA/stroke in 2021, aortic atherosclerosis, mild coronary calcification in the 52nd percentile with no significant coronary disease by CTA in 2022, HTN, HLD.  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 Scot Dock.  She was referred to Dr. Einar Gip for preop cardiovascular restratification prior to surgery.  Per note 04/19/2022, "Preoperative cardiovascular examination From cardiac standpoint, her risk factors are well-controlled including hypertension, hyperlipidemia, hyperglycemia. I reviewed external labs, renal function is normal, lipids under excellent control and blood pressure is also well-controlled and she is on appropriate medical therapy. Hence overall should be considered low risk from cardiac standpoint to proceed with endovascular repair."  Diet controlled DM2, last A1c 5.8 on 11/10/2021.  Preop labs reviewed, WNL.  EKG 04/19/2022: NSR.  Rate 85.  Incomplete right bundle branch block.  Coronary CT 12/13/2020: IMPRESSION: 1. Total coronary calcium score of 2.37. This was 54th percentile for age and sex matched control.  2. Normal coronary origin with right dominance.  3. CAD-RADS = 1. 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  4. Aortic atherosclerosis.  RECOMMENDATIONS:  Consider non-atherosclerotic causes of chest pain. Consider preventive therapy and risk factor modification.  TTE 11/08/2020: 1. Left ventricular ejection fraction, by estimation, is 65 to 70%. The  left ventricle has normal function. There is mild left ventricular  hypertrophy. Left ventricular diastolic parameters were normal.  2. Right ventricular systolic function is normal. The  right ventricular  size is normal.  3. The mitral valve is normal in structure. Trivial mitral valve  regurgitation.  4. The aortic valve is normal in structure. Aortic valve regurgitation is  not visualized.    )        Anesthesia Quick Evaluation

## 2022-05-12 ENCOUNTER — Inpatient Hospital Stay (HOSPITAL_COMMUNITY): Payer: PPO | Admitting: Anesthesiology

## 2022-05-12 ENCOUNTER — Encounter (HOSPITAL_COMMUNITY): Payer: Self-pay | Admitting: Vascular Surgery

## 2022-05-12 ENCOUNTER — Other Ambulatory Visit: Payer: Self-pay

## 2022-05-12 ENCOUNTER — Inpatient Hospital Stay (HOSPITAL_COMMUNITY): Payer: PPO

## 2022-05-12 ENCOUNTER — Encounter (HOSPITAL_COMMUNITY)
Admission: RE | Disposition: A | Discharge status: Home / Self Care | Payer: Self-pay | Source: Home / Self Care | Attending: Vascular Surgery

## 2022-05-12 ENCOUNTER — Inpatient Hospital Stay (HOSPITAL_COMMUNITY)
Admission: RE | Admit: 2022-05-12 | Discharge: 2022-05-13 | DRG: 269 | Disposition: A | Discharge status: Home / Self Care | Payer: PPO | Attending: Vascular Surgery | Admitting: Vascular Surgery

## 2022-05-12 ENCOUNTER — Inpatient Hospital Stay (HOSPITAL_COMMUNITY): Payer: PPO | Admitting: Physician Assistant

## 2022-05-12 DIAGNOSIS — I2510 Atherosclerotic heart disease of native coronary artery without angina pectoris: Secondary | ICD-10-CM | POA: Diagnosis present

## 2022-05-12 DIAGNOSIS — Z79899 Other long term (current) drug therapy: Secondary | ICD-10-CM

## 2022-05-12 DIAGNOSIS — Z882 Allergy status to sulfonamides status: Secondary | ICD-10-CM

## 2022-05-12 DIAGNOSIS — Z823 Family history of stroke: Secondary | ICD-10-CM

## 2022-05-12 DIAGNOSIS — Z9049 Acquired absence of other specified parts of digestive tract: Secondary | ICD-10-CM

## 2022-05-12 DIAGNOSIS — E785 Hyperlipidemia, unspecified: Secondary | ICD-10-CM | POA: Diagnosis present

## 2022-05-12 DIAGNOSIS — Z881 Allergy status to other antibiotic agents status: Secondary | ICD-10-CM

## 2022-05-12 DIAGNOSIS — I7143 Infrarenal abdominal aortic aneurysm, without rupture: Secondary | ICD-10-CM | POA: Diagnosis not present

## 2022-05-12 DIAGNOSIS — Z888 Allergy status to other drugs, medicaments and biological substances status: Secondary | ICD-10-CM | POA: Diagnosis not present

## 2022-05-12 DIAGNOSIS — F32A Depression, unspecified: Secondary | ICD-10-CM | POA: Diagnosis present

## 2022-05-12 DIAGNOSIS — I69351 Hemiplegia and hemiparesis following cerebral infarction affecting right dominant side: Secondary | ICD-10-CM

## 2022-05-12 DIAGNOSIS — Z01812 Encounter for preprocedural laboratory examination: Secondary | ICD-10-CM | POA: Diagnosis not present

## 2022-05-12 DIAGNOSIS — E119 Type 2 diabetes mellitus without complications: Secondary | ICD-10-CM | POA: Diagnosis present

## 2022-05-12 DIAGNOSIS — I700 Atherosclerosis of aorta: Secondary | ICD-10-CM | POA: Diagnosis present

## 2022-05-12 DIAGNOSIS — K760 Fatty (change of) liver, not elsewhere classified: Secondary | ICD-10-CM | POA: Diagnosis present

## 2022-05-12 DIAGNOSIS — M549 Dorsalgia, unspecified: Secondary | ICD-10-CM | POA: Diagnosis present

## 2022-05-12 DIAGNOSIS — G8929 Other chronic pain: Secondary | ICD-10-CM | POA: Diagnosis present

## 2022-05-12 DIAGNOSIS — Z87891 Personal history of nicotine dependence: Secondary | ICD-10-CM

## 2022-05-12 DIAGNOSIS — Z880 Allergy status to penicillin: Secondary | ICD-10-CM

## 2022-05-12 DIAGNOSIS — I714 Abdominal aortic aneurysm, without rupture, unspecified: Secondary | ICD-10-CM | POA: Diagnosis present

## 2022-05-12 DIAGNOSIS — E1151 Type 2 diabetes mellitus with diabetic peripheral angiopathy without gangrene: Secondary | ICD-10-CM | POA: Diagnosis not present

## 2022-05-12 DIAGNOSIS — I10 Essential (primary) hypertension: Secondary | ICD-10-CM | POA: Diagnosis present

## 2022-05-12 DIAGNOSIS — Z8616 Personal history of COVID-19: Secondary | ICD-10-CM

## 2022-05-12 DIAGNOSIS — Z7902 Long term (current) use of antithrombotics/antiplatelets: Secondary | ICD-10-CM | POA: Diagnosis not present

## 2022-05-12 DIAGNOSIS — Z8249 Family history of ischemic heart disease and other diseases of the circulatory system: Secondary | ICD-10-CM

## 2022-05-12 DIAGNOSIS — F419 Anxiety disorder, unspecified: Secondary | ICD-10-CM | POA: Diagnosis present

## 2022-05-12 HISTORY — PX: ABDOMINAL AORTIC ENDOVASCULAR STENT GRAFT: SHX5707

## 2022-05-12 LAB — BASIC METABOLIC PANEL
Anion gap: 11 (ref 5–15)
BUN: 6 mg/dL — ABNORMAL LOW (ref 8–23)
CO2: 20 mmol/L — ABNORMAL LOW (ref 22–32)
Calcium: 8.6 mg/dL — ABNORMAL LOW (ref 8.9–10.3)
Chloride: 109 mmol/L (ref 98–111)
Creatinine, Ser: 0.56 mg/dL (ref 0.44–1.00)
GFR, Estimated: >60 mL/min (ref >60)
Glucose, Bld: 118 mg/dL — ABNORMAL HIGH (ref 70–99)
Potassium: 3.7 mmol/L (ref 3.5–5.1)
Sodium: 140 mmol/L (ref 135–145)

## 2022-05-12 LAB — CBC
HCT: 30 % — ABNORMAL LOW (ref 36.0–46.0)
Hemoglobin: 10.4 g/dL — ABNORMAL LOW (ref 12.0–15.0)
MCH: 33.1 pg (ref 26.0–34.0)
MCHC: 34.7 g/dL (ref 30.0–36.0)
MCV: 95.5 fL (ref 80.0–100.0)
Platelets: 187 10*3/uL (ref 150–400)
RBC: 3.14 MIL/uL — ABNORMAL LOW (ref 3.87–5.11)
RDW: 13.2 % (ref 11.5–15.5)
WBC: 8.3 10*3/uL (ref 4.0–10.5)
nRBC: 0 % (ref 0.0–0.2)

## 2022-05-12 LAB — MAGNESIUM: Magnesium: 1.8 mg/dL (ref 1.7–2.4)

## 2022-05-12 LAB — PREPARE RBC (CROSSMATCH)

## 2022-05-12 LAB — PROTIME-INR
INR: 1.2 (ref 0.8–1.2)
Prothrombin Time: 14.8 seconds (ref 11.4–15.2)

## 2022-05-12 LAB — APTT: aPTT: 35 seconds (ref 24–36)

## 2022-05-12 LAB — POCT ACTIVATED CLOTTING TIME
Activated Clotting Time: 277 seconds
Activated Clotting Time: 244 seconds

## 2022-05-12 LAB — GLUCOSE, CAPILLARY
Glucose-Capillary: 115 mg/dL — ABNORMAL HIGH (ref 70–99)
Glucose-Capillary: 113 mg/dL — ABNORMAL HIGH (ref 70–99)
Glucose-Capillary: 93 mg/dL (ref 70–99)

## 2022-05-12 SURGERY — INSERTION, ENDOVASCULAR STENT GRAFT, AORTA, ABDOMINAL
Anesthesia: General

## 2022-05-12 MED ORDER — ACETAMINOPHEN 500 MG PO TABS
1000.0000 mg | ORAL_TABLET | Freq: Once | ORAL | Status: AC
  Administered 2022-05-12: 1000 mg via ORAL
  Filled 2022-05-12: qty 2

## 2022-05-12 MED ORDER — SODIUM CHLORIDE 0.9 % IV SOLN: INTRAVENOUS | Status: DC

## 2022-05-12 MED ORDER — GABAPENTIN 400 MG PO CAPS
800.0000 mg | ORAL_CAPSULE | Freq: Every day | ORAL | Status: DC
  Administered 2022-05-12: 800 mg via ORAL
  Filled 2022-05-12: qty 2

## 2022-05-12 MED ORDER — ACETAMINOPHEN 325 MG PO TABS
ORAL_TABLET | ORAL | Status: AC
  Filled 2022-05-12: qty 2

## 2022-05-12 MED ORDER — PROTAMINE SULFATE 10 MG/ML IV SOLN
INTRAVENOUS | Status: AC
  Filled 2022-05-12: qty 15

## 2022-05-12 MED ORDER — HEPARIN SODIUM (PORCINE) 1000 UNIT/ML IJ SOLN
INTRAMUSCULAR | Status: AC
  Filled 2022-05-12: qty 10

## 2022-05-12 MED ORDER — SUGAMMADEX SODIUM 200 MG/2ML IV SOLN
INTRAVENOUS | Status: DC | PRN
  Administered 2022-05-12: 200 mg via INTRAVENOUS

## 2022-05-12 MED ORDER — ESMOLOL HCL 100 MG/10ML IV SOLN
INTRAVENOUS | Status: AC
  Filled 2022-05-12: qty 10

## 2022-05-12 MED ORDER — CEFAZOLIN SODIUM-DEXTROSE 2-4 GM/100ML-% IV SOLN
2.0000 g | Freq: Three times a day (TID) | INTRAVENOUS | Status: AC
  Administered 2022-05-12 (×2): 2 g via INTRAVENOUS
  Filled 2022-05-12 (×2): qty 100

## 2022-05-12 MED ORDER — PHENYLEPHRINE 80 MCG/ML (10ML) SYRINGE FOR IV PUSH (FOR BLOOD PRESSURE SUPPORT)
PREFILLED_SYRINGE | INTRAVENOUS | Status: DC | PRN
  Administered 2022-05-12 (×4): 80 ug via INTRAVENOUS

## 2022-05-12 MED ORDER — BENZONATATE 100 MG PO CAPS: 100.0000 mg | ORAL_CAPSULE | Freq: Three times a day (TID) | ORAL | Status: DC | PRN

## 2022-05-12 MED ORDER — FENTANYL CITRATE (PF) 100 MCG/2ML IJ SOLN: 25.0000 ug | INTRAMUSCULAR | Status: DC | PRN

## 2022-05-12 MED ORDER — EZETIMIBE 10 MG PO TABS
10.0000 mg | ORAL_TABLET | Freq: Every day | ORAL | Status: DC
  Administered 2022-05-12: 10 mg via ORAL
  Filled 2022-05-12 (×2): qty 1

## 2022-05-12 MED ORDER — PROTAMINE SULFATE 10 MG/ML IV SOLN
INTRAVENOUS | Status: DC | PRN
  Administered 2022-05-12 (×2): 20 mg via INTRAVENOUS
  Administered 2022-05-12: 10 mg via INTRAVENOUS

## 2022-05-12 MED ORDER — PANTOPRAZOLE SODIUM 40 MG PO TBEC
40.0000 mg | DELAYED_RELEASE_TABLET | Freq: Every day | ORAL | Status: DC
  Administered 2022-05-12: 40 mg via ORAL
  Filled 2022-05-12 (×2): qty 1

## 2022-05-12 MED ORDER — MAGNESIUM SULFATE 2 GM/50ML IV SOLN: 2.0000 g | Freq: Every day | INTRAVENOUS | Status: DC | PRN

## 2022-05-12 MED ORDER — IODIXANOL 320 MG/ML IV SOLN
INTRAVENOUS | Status: DC | PRN
  Administered 2022-05-12: 80.3 mL

## 2022-05-12 MED ORDER — DIAZEPAM 5 MG PO TABS: 5.0000 mg | ORAL_TABLET | Freq: Three times a day (TID) | ORAL | Status: DC | PRN

## 2022-05-12 MED ORDER — PHENOL 1.4 % MT LIQD: 1.0000 | OROMUCOSAL | Status: DC | PRN

## 2022-05-12 MED ORDER — FENTANYL CITRATE (PF) 250 MCG/5ML IJ SOLN
INTRAMUSCULAR | Status: DC | PRN
  Administered 2022-05-12: 50 ug via INTRAVENOUS
  Administered 2022-05-12: 100 ug via INTRAVENOUS
  Administered 2022-05-12: 50 ug via INTRAVENOUS

## 2022-05-12 MED ORDER — INSULIN ASPART 100 UNIT/ML IJ SOLN: 0.0000 [IU] | INTRAMUSCULAR | Status: DC | PRN

## 2022-05-12 MED ORDER — 0.9 % SODIUM CHLORIDE (POUR BTL) OPTIME
TOPICAL | Status: DC | PRN
  Administered 2022-05-12: 1000 mL

## 2022-05-12 MED ORDER — ATORVASTATIN CALCIUM 80 MG PO TABS
80.0000 mg | ORAL_TABLET | Freq: Every day | ORAL | Status: DC
  Administered 2022-05-12: 80 mg via ORAL
  Filled 2022-05-12 (×2): qty 1

## 2022-05-12 MED ORDER — MIDAZOLAM HCL 2 MG/2ML IJ SOLN
INTRAMUSCULAR | Status: DC | PRN
  Administered 2022-05-12: 2 mg via INTRAVENOUS

## 2022-05-12 MED ORDER — DONEPEZIL HCL 5 MG PO TABS
5.0000 mg | ORAL_TABLET | Freq: Every day | ORAL | Status: DC
  Administered 2022-05-12: 5 mg via ORAL
  Filled 2022-05-12 (×2): qty 1

## 2022-05-12 MED ORDER — ORAL CARE MOUTH RINSE: 15.0000 mL | Freq: Once | OROMUCOSAL | Status: AC

## 2022-05-12 MED ORDER — POTASSIUM CHLORIDE CRYS ER 20 MEQ PO TBCR: 20.0000 meq | EXTENDED_RELEASE_TABLET | Freq: Every day | ORAL | Status: DC | PRN

## 2022-05-12 MED ORDER — CHLORHEXIDINE GLUCONATE 0.12 % MT SOLN
15.0000 mL | Freq: Once | OROMUCOSAL | Status: AC
  Administered 2022-05-12: 15 mL via OROMUCOSAL
  Filled 2022-05-12: qty 15

## 2022-05-12 MED ORDER — HYDROMORPHONE HCL 1 MG/ML IJ SOLN
0.5000 mg | INTRAMUSCULAR | Status: DC | PRN
  Administered 2022-05-12 (×2): 1 mg via INTRAVENOUS
  Filled 2022-05-12 (×2): qty 1

## 2022-05-12 MED ORDER — LIDOCAINE 2% (20 MG/ML) 5 ML SYRINGE
INTRAMUSCULAR | Status: AC
  Filled 2022-05-12: qty 5

## 2022-05-12 MED ORDER — ACETAMINOPHEN 325 MG PO TABS
325.0000 mg | ORAL_TABLET | ORAL | Status: DC | PRN
  Administered 2022-05-12: 650 mg via ORAL
  Filled 2022-05-12: qty 2

## 2022-05-12 MED ORDER — PROPOFOL 10 MG/ML IV BOLUS
INTRAVENOUS | Status: AC
  Filled 2022-05-12: qty 20

## 2022-05-12 MED ORDER — HEPARIN SODIUM (PORCINE) 5000 UNIT/ML IJ SOLN
5000.0000 [IU] | Freq: Three times a day (TID) | INTRAMUSCULAR | Status: DC
  Administered 2022-05-12 – 2022-05-13 (×2): 5000 [IU] via SUBCUTANEOUS
  Filled 2022-05-12 (×3): qty 1

## 2022-05-12 MED ORDER — ALUM & MAG HYDROXIDE-SIMETH 200-200-20 MG/5ML PO SUSP: 15.0000 mL | ORAL | Status: DC | PRN

## 2022-05-12 MED ORDER — MIDAZOLAM HCL 2 MG/2ML IJ SOLN
INTRAMUSCULAR | Status: AC
  Filled 2022-05-12: qty 2

## 2022-05-12 MED ORDER — HEPARIN 6000 UNIT IRRIGATION SOLUTION
Status: AC
  Filled 2022-05-12: qty 500

## 2022-05-12 MED ORDER — HYDRALAZINE HCL 20 MG/ML IJ SOLN: 5.0000 mg | INTRAMUSCULAR | Status: DC | PRN

## 2022-05-12 MED ORDER — OXYCODONE-ACETAMINOPHEN 5-325 MG PO TABS
1.0000 | ORAL_TABLET | ORAL | Status: DC | PRN
  Administered 2022-05-12 (×2): 1 via ORAL
  Administered 2022-05-12 – 2022-05-13 (×2): 2 via ORAL
  Filled 2022-05-12: qty 1
  Filled 2022-05-12 (×2): qty 2
  Filled 2022-05-12: qty 1

## 2022-05-12 MED ORDER — ONDANSETRON HCL 4 MG/2ML IJ SOLN
INTRAMUSCULAR | Status: DC | PRN
  Administered 2022-05-12: 4 mg via INTRAVENOUS

## 2022-05-12 MED ORDER — ROCURONIUM BROMIDE 10 MG/ML (PF) SYRINGE
PREFILLED_SYRINGE | INTRAVENOUS | Status: AC
  Filled 2022-05-12: qty 10

## 2022-05-12 MED ORDER — GABAPENTIN 400 MG PO CAPS
400.0000 mg | ORAL_CAPSULE | Freq: Two times a day (BID) | ORAL | Status: DC
  Administered 2022-05-12 – 2022-05-13 (×2): 400 mg via ORAL
  Filled 2022-05-12 (×2): qty 1

## 2022-05-12 MED ORDER — LACTATED RINGERS IV SOLN: INTRAVENOUS | Status: DC | PRN

## 2022-05-12 MED ORDER — VANCOMYCIN HCL IN DEXTROSE 1-5 GM/200ML-% IV SOLN
1000.0000 mg | INTRAVENOUS | Status: AC
  Administered 2022-05-12: 1000 mg via INTRAVENOUS
  Filled 2022-05-12: qty 200

## 2022-05-12 MED ORDER — ZOLPIDEM TARTRATE 5 MG PO TABS
5.0000 mg | ORAL_TABLET | Freq: Every evening | ORAL | Status: DC | PRN
  Administered 2022-05-12: 5 mg via ORAL
  Filled 2022-05-12: qty 1

## 2022-05-12 MED ORDER — ACETAMINOPHEN 325 MG RE SUPP: 325.0000 mg | RECTAL | Status: DC | PRN

## 2022-05-12 MED ORDER — PROPOFOL 10 MG/ML IV BOLUS
INTRAVENOUS | Status: DC | PRN
  Administered 2022-05-12: 150 mg via INTRAVENOUS

## 2022-05-12 MED ORDER — ONDANSETRON HCL 4 MG/2ML IJ SOLN: 4.0000 mg | Freq: Once | INTRAMUSCULAR | Status: DC | PRN

## 2022-05-12 MED ORDER — CEFAZOLIN SODIUM-DEXTROSE 2-4 GM/100ML-% IV SOLN
INTRAVENOUS | Status: AC
  Filled 2022-05-12: qty 100

## 2022-05-12 MED ORDER — CHLORHEXIDINE GLUCONATE CLOTH 2 % EX PADS: 6.0000 | MEDICATED_PAD | Freq: Once | CUTANEOUS | Status: DC

## 2022-05-12 MED ORDER — LOSARTAN POTASSIUM 50 MG PO TABS
50.0000 mg | ORAL_TABLET | Freq: Every evening | ORAL | Status: DC
  Administered 2022-05-12: 50 mg via ORAL
  Filled 2022-05-12 (×2): qty 1

## 2022-05-12 MED ORDER — LIDOCAINE 2% (20 MG/ML) 5 ML SYRINGE
INTRAMUSCULAR | Status: DC | PRN
  Administered 2022-05-12: 80 mg via INTRAVENOUS

## 2022-05-12 MED ORDER — SODIUM CHLORIDE 0.9% IV SOLUTION: Freq: Once | INTRAVENOUS | Status: DC

## 2022-05-12 MED ORDER — HEPARIN 6000 UNIT IRRIGATION SOLUTION
Status: DC | PRN
  Administered 2022-05-12: 1

## 2022-05-12 MED ORDER — LACTATED RINGERS IV SOLN: INTRAVENOUS | Status: DC

## 2022-05-12 MED ORDER — ESMOLOL HCL 100 MG/10ML IV SOLN
INTRAVENOUS | Status: DC | PRN
  Administered 2022-05-12: 40 mg via INTRAVENOUS

## 2022-05-12 MED ORDER — PHENYLEPHRINE HCL-NACL 20-0.9 MG/250ML-% IV SOLN
INTRAVENOUS | Status: DC | PRN
  Administered 2022-05-12: 25 ug/min via INTRAVENOUS

## 2022-05-12 MED ORDER — DOCUSATE SODIUM 100 MG PO CAPS
100.0000 mg | ORAL_CAPSULE | Freq: Every day | ORAL | Status: DC
  Filled 2022-05-12 (×2): qty 1

## 2022-05-12 MED ORDER — HEPARIN SODIUM (PORCINE) 1000 UNIT/ML IJ SOLN
INTRAMUSCULAR | Status: DC | PRN
  Administered 2022-05-12: 7000 [IU] via INTRAVENOUS

## 2022-05-12 MED ORDER — ONDANSETRON HCL 4 MG/2ML IJ SOLN
INTRAMUSCULAR | Status: AC
  Filled 2022-05-12: qty 2

## 2022-05-12 MED ORDER — DEXAMETHASONE SODIUM PHOSPHATE 10 MG/ML IJ SOLN
INTRAMUSCULAR | Status: DC | PRN
  Administered 2022-05-12: 10 mg via INTRAVENOUS

## 2022-05-12 MED ORDER — MAGNESIUM OXIDE -MG SUPPLEMENT 400 (240 MG) MG PO TABS
200.0000 mg | ORAL_TABLET | Freq: Every day | ORAL | Status: DC
  Administered 2022-05-12: 200 mg via ORAL
  Filled 2022-05-12: qty 1

## 2022-05-12 MED ORDER — DULOXETINE HCL 60 MG PO CPEP
60.0000 mg | ORAL_CAPSULE | Freq: Every day | ORAL | Status: DC
  Administered 2022-05-13: 60 mg via ORAL
  Filled 2022-05-12 (×2): qty 1

## 2022-05-12 MED ORDER — METOPROLOL TARTRATE 5 MG/5ML IV SOLN: 2.0000 mg | INTRAVENOUS | Status: DC | PRN

## 2022-05-12 MED ORDER — ASPIRIN 81 MG PO TBEC
81.0000 mg | DELAYED_RELEASE_TABLET | Freq: Every day | ORAL | Status: DC
  Administered 2022-05-13: 81 mg via ORAL
  Filled 2022-05-12 (×2): qty 1

## 2022-05-12 MED ORDER — ACETAMINOPHEN 500 MG PO TABS: 1000.0000 mg | ORAL_TABLET | Freq: Four times a day (QID) | ORAL | Status: DC | PRN

## 2022-05-12 MED ORDER — ONDANSETRON HCL 4 MG/2ML IJ SOLN: 4.0000 mg | Freq: Four times a day (QID) | INTRAMUSCULAR | Status: DC | PRN

## 2022-05-12 MED ORDER — ROCURONIUM BROMIDE 10 MG/ML (PF) SYRINGE
PREFILLED_SYRINGE | INTRAVENOUS | Status: DC | PRN
  Administered 2022-05-12: 20 mg via INTRAVENOUS
  Administered 2022-05-12: 60 mg via INTRAVENOUS

## 2022-05-12 MED ORDER — CLOPIDOGREL BISULFATE 75 MG PO TABS
75.0000 mg | ORAL_TABLET | Freq: Every day | ORAL | Status: DC
  Administered 2022-05-13: 75 mg via ORAL
  Filled 2022-05-12 (×2): qty 1

## 2022-05-12 MED ORDER — SODIUM CHLORIDE 0.9 % IV SOLN: 500.0000 mL | Freq: Once | INTRAVENOUS | Status: DC | PRN

## 2022-05-12 MED ORDER — LABETALOL HCL 5 MG/ML IV SOLN: 10.0000 mg | INTRAVENOUS | Status: DC | PRN

## 2022-05-12 MED ORDER — FENTANYL CITRATE (PF) 250 MCG/5ML IJ SOLN
INTRAMUSCULAR | Status: AC
  Filled 2022-05-12: qty 5

## 2022-05-12 MED ORDER — PROMETHAZINE HCL 25 MG PO TABS: 25.0000 mg | ORAL_TABLET | Freq: Two times a day (BID) | ORAL | Status: DC | PRN

## 2022-05-12 SURGICAL SUPPLY — 51 items
BAG COUNTER SPONGE SURGICOUNT (BAG) ×1 IMPLANT
BLADE CLIPPER SURG (BLADE) ×1 IMPLANT
CANISTER SUCT 3000ML PPV (MISCELLANEOUS) ×1 IMPLANT
CATH BEACON 5 .035 65 KMP TIP (CATHETERS) IMPLANT
CATH BEACON 5 .035 65 VANSC3 (CATHETERS) IMPLANT
CATH BEACON 5.038 65CM KMP-01 (CATHETERS) ×1 IMPLANT
CATH FLUSH .035X65 (CATHETERS) IMPLANT
CATH OMNI FLUSH .035X70CM (CATHETERS) ×1 IMPLANT
CATH STRAIGHT 5FR 65CM (CATHETERS) IMPLANT
CLOSURE MYNX CONTROL 5F (Vascular Products) IMPLANT
CLOSURE PERCLOSE PROSTYLE (VASCULAR PRODUCTS) IMPLANT
DERMABOND ADVANCED .7 DNX12 (GAUZE/BANDAGES/DRESSINGS) ×1 IMPLANT
DEVICE CLOSURE PERCLS PRGLD 6F (VASCULAR PRODUCTS) IMPLANT
DEVICE TORQUE KENDALL .025-038 (MISCELLANEOUS) IMPLANT
DRSG TEGADERM 2-3/8X2-3/4 SM (GAUZE/BANDAGES/DRESSINGS) ×2 IMPLANT
ELECT REM PT RETURN 9FT ADLT (ELECTROSURGICAL) ×2
ELECTRODE REM PT RTRN 9FT ADLT (ELECTROSURGICAL) ×2 IMPLANT
EXCLDR EXT ENDO 20X4.5 15F (Endovascular Graft) ×3 IMPLANT
EXCLUDER EXT ENDO 20X4.5 15F (Endovascular Graft) IMPLANT
GAUZE SPONGE 2X2 8PLY STRL LF (GAUZE/BANDAGES/DRESSINGS) ×2 IMPLANT
GLOVE BIO SURGEON STRL SZ7.5 (GLOVE) ×1 IMPLANT
GLOVE BIOGEL PI IND STRL 8 (GLOVE) ×1 IMPLANT
GLOVE SURG POLY ORTHO LF SZ7.5 (GLOVE) IMPLANT
GLOVE SURG UNDER LTX SZ8 (GLOVE) ×1 IMPLANT
GOWN STRL REUS W/ TWL LRG LVL3 (GOWN DISPOSABLE) ×3 IMPLANT
GOWN STRL REUS W/TWL LRG LVL3 (GOWN DISPOSABLE) ×3
GRAFT BALLN CATH 65CM (STENTS) ×1 IMPLANT
KIT BASIN OR (CUSTOM PROCEDURE TRAY) ×1 IMPLANT
KIT MICROPUNCTURE NIT STIFF (SHEATH) IMPLANT
KIT TURNOVER KIT B (KITS) ×1 IMPLANT
NS IRRIG 1000ML POUR BTL (IV SOLUTION) ×1 IMPLANT
PACK ENDOVASCULAR (PACKS) ×1 IMPLANT
PAD ARMBOARD 7.5X6 YLW CONV (MISCELLANEOUS) ×2 IMPLANT
PERCLOSE PROGLIDE 6F (VASCULAR PRODUCTS)
SET MICROPUNCTURE 5F STIFF (MISCELLANEOUS) ×1 IMPLANT
SHEATH BRITE TIP 8FR 23CM (SHEATH) ×1 IMPLANT
SHEATH DRYSEAL FLEX 16FR 33CM (SHEATH) IMPLANT
SHEATH PINNACLE 5F 10CM (SHEATH) IMPLANT
SHEATH PINNACLE 8F 10CM (SHEATH) ×1 IMPLANT
STENT GRAFT BALLN CATH 65CM (STENTS) ×1
STOPCOCK MORSE 400PSI 3WAY (MISCELLANEOUS) ×1 IMPLANT
SUT MNCRL AB 4-0 PS2 18 (SUTURE) ×2 IMPLANT
SUT PROLENE 5 0 C 1 24 (SUTURE) IMPLANT
SUT SILK 2 0 PERMA HAND 18 BK (SUTURE) IMPLANT
SYR 20ML LL LF (SYRINGE) ×1 IMPLANT
TOWEL GREEN STERILE (TOWEL DISPOSABLE) ×1 IMPLANT
TRAY FOLEY MTR SLVR 16FR STAT (SET/KITS/TRAYS/PACK) ×1 IMPLANT
TUBING HIGH PRESSURE 120CM (CONNECTOR) ×1 IMPLANT
TUBING INJECTOR 48 (MISCELLANEOUS) IMPLANT
WIRE AMPLATZ SS-J .035X180CM (WIRE) ×2 IMPLANT
WIRE BENTSON .035X145CM (WIRE) ×2 IMPLANT

## 2022-05-12 NOTE — Progress Notes (Signed)
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

## 2022-05-12 NOTE — Progress Notes (Signed)
Upon assessment, small 2x2 gauze on right groin was saturated w/ blood. Prior oozing was noted by recovery RN in report.  Groin is not currently oozing when gauze removed- so this was replaced and will continue to monitor site. Otherwise groin is soft w/ no bruising.

## 2022-05-12 NOTE — Anesthesia Postprocedure Evaluation (Signed)
Anesthesia Post Note  Patient: Deborah Peters  Procedure(s) Performed: ABDOMINAL AORTIC ENDOVASCULAR STENT GRAFT     Patient location during evaluation: PACU Anesthesia Type: General Level of consciousness: awake and alert Pain management: pain level controlled Vital Signs Assessment: post-procedure vital signs reviewed and stable Respiratory status: spontaneous breathing, nonlabored ventilation and respiratory function stable Cardiovascular status: blood pressure returned to baseline and stable Postop Assessment: no apparent nausea or vomiting Anesthetic complications: no   No notable events documented.  Last Vitals:  Vitals:   05/12/22 1215 05/12/22 1315  BP: 109/67 120/64  Pulse: 76 77  Resp: 15 15  Temp:    SpO2: 92% 97%    Last Pain:  Vitals:   05/12/22 1315  TempSrc:   PainSc: Conger Jalien Weakland

## 2022-05-12 NOTE — Transfer of Care (Signed)
Immediate Anesthesia Transfer of Care Note  Patient: Deborah Peters  Procedure(s) Performed: ABDOMINAL AORTIC ENDOVASCULAR STENT GRAFT  Patient Location: PACU  Anesthesia Type:General  Level of Consciousness: awake, alert , and oriented  Airway & Oxygen Therapy: Patient Spontanous Breathing  Post-op Assessment: Report given to RN, Post -op Vital signs reviewed and stable, and Patient moving all extremities X 4  Post vital signs: Reviewed and stable  Last Vitals:  Vitals Value Taken Time  BP 111/96 05/12/22 0929  Temp    Pulse 74 05/12/22 0929  Resp 12 05/12/22 0929  SpO2 99 % 05/12/22 0929  Vitals shown include unvalidated device data.  Last Pain:  Vitals:   05/12/22 0626  TempSrc:   PainSc: 6       Patients Stated Pain Goal: 5 (0000000 [id number])  Complications: No notable events documented.

## 2022-05-12 NOTE — Anesthesia Procedure Notes (Signed)
Procedure Name: Intubation Date/Time: 05/12/2022 7:42 AM  Performed by: Harden Mo, CRNAPre-anesthesia Checklist: Patient identified, Emergency Drugs available, Suction available and Patient being monitored Patient Re-evaluated:Patient Re-evaluated prior to induction Oxygen Delivery Method: Circle System Utilized Preoxygenation: Pre-oxygenation with 100% oxygen Induction Type: IV induction Ventilation: Mask ventilation without difficulty Laryngoscope Size: Miller and 2 Grade View: Grade I Tube type: Oral Tube size: 7.0 mm Number of attempts: 1 Airway Equipment and Method: Stylet and Oral airway Placement Confirmation: ETT inserted through vocal cords under direct vision, positive ETCO2 and breath sounds checked- equal and bilateral Secured at: 22 cm Tube secured with: Tape Dental Injury: Teeth and Oropharynx as per pre-operative assessment

## 2022-05-12 NOTE — Op Note (Signed)
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 Endovascular aortic tube graft placement Perclose right common femoral artery Mynx closure left common femoral artery  SURGEON: Judeth Cornfield. Scot Dock, MD  ASSIST: Laurence Slate, PA  ANESTHESIA: General  EBL: Minimal  INDICATIONS:    Deborah Peters is a 67 y.o. 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  Given the saccular nature of these aneurysms the risk of rupture was significant and elective repair was recommended.  FINDINGS:   Completion arteriogram shows an excellent result with no type I or type II endoleak.  Successful exclusion of both saccular aneurysms  TECHNIQUE:   The patient was taken to the operating room after monitoring lines were placed by anesthesia.  The abdomen and groins were prepped and draped in usual sterile fashion.  On the left side, under ultrasound guidance, the left common femoral artery was cannulated with a micropuncture needle and a micropuncture sheath was introduced over a wire.  This was then exchanged for a 5 French sheath over a Bentson wire.  On the right side under ultrasound guidance, the right common femoral artery was cannulated with a micropuncture needle and a micropuncture sheath introduced over a wire.  I then advanced the Bentson wire into the infrarenal aorta.  The tract was dilated with an 8 [place] dilator.  2 Perclose devices were then placed.  The initial device was rotated 15 degrees medially and the second device rotated 15 degrees laterally.  I then placed a 16 French sheath on the right.  The patient was heparinized and ACT was monitored throughout the procedure.  A pigtail catheter was put  up the left side and aortogram was obtained.  The bifurcation was marked in the saccular aneurysms were marked.  I did try to cannulate the saccular aneurysms in the left side thinking we could potentially place coils here but the mouth was too wide and were unable to keep the catheter within the saccular aneurysms.  As this was felt to not be critical we felt that we could address the aneurysms with overlapping stents and that coil embolization would not be necessary of the sacs.  A 20 mm x 4.5 cm aortic cuff was placed just above the bifurcation and is partially deployed and then gently placed right at the distal aorta at the bifurcation.  This was then deployed.  A second 20 x 4.5 cm aortic cuff was placed overlapping about 2 cm.  This was deployed without difficulty.  Completion film showed some contrast at the junction escaping into the more proximal saccular aneurysm.  I elected to address this with a third covered stent which would overlap the 2 and address the issue.  A third 28 x 4.5 cm aortic cuff was placed.  This was then gently ballooned distally, then proximally, and then at the central portion.  Completion film showed an excellent result with no type I, type II, or type III endoleak.  On the right side the sheath was removed over a wire and the Perclose devices were secured and there was good hemostasis.  The wire was then removed and the Perclose was secured.  There was good hemostasis.  The  sutures were then cut and the skin closed with a 4-0 Monocryl.  On the left side the left common femoral artery was closed with a Mynx device with good hemostasis noted.  The patient tolerated the procedure well and was transferred to the recovery room in stable condition.  All needle and sponge counts were correct.   Given the complexity of the case a first assistant was necessary to assist with managing pressure of the groin during exchange of the sheath, wire access, and deployment of the  graft.  [name], MD, FACS [facility]  DATE OF DICTATION:   05/12/2022

## 2022-05-12 NOTE — Interval H&P Note (Signed)
History and Physical Interval Note:  05/12/2022 7:15 AM  Deborah Peters  has presented today for surgery, with the diagnosis of Infrarenal abdominal aortic aneurysm without rupture.  The various methods of treatment have been discussed with the patient and family. After consideration of risks, benefits and other options for treatment, the patient has consented to  Procedure(s): ABDOMINAL AORTIC ENDOVASCULAR STENT GRAFT (N/A) as a surgical intervention.  The patient's history has been reviewed, patient examined, no change in status, stable for surgery.  I have reviewed the patient's chart and labs.  Questions were answered to the patient's satisfaction.     [name]

## 2022-05-12 NOTE — Anesthesia Procedure Notes (Addendum)
Arterial Line Insertion Start/End2/16/2024 7:10 AM, 05/12/2022 7:18 AM Performed by: Harden Mo, CRNA, CRNA  Patient location: Pre-op. Preanesthetic checklist: patient identified, IV checked, site marked, risks and benefits discussed, surgical consent, monitors and equipment checked, pre-op evaluation, timeout performed and anesthesia consent Lidocaine 1% used for infiltration Left, radial was placed Catheter size: 20 G Hand hygiene performed  and maximum sterile barriers used   Attempts: 1 Procedure performed without using ultrasound guided technique. Following insertion, dressing applied and Biopatch. Post procedure assessment: normal and unchanged  Patient tolerated the procedure well with no immediate complications.

## 2022-05-13 LAB — BPAM RBC
Blood Product Expiration Date: 202402172359
Blood Product Expiration Date: 202402192359
Blood Product Expiration Date: 202402202359
Blood Product Expiration Date: 202402292359
ISSUE DATE / TIME: 202402141603
ISSUE DATE / TIME: 202402170007
ISSUE DATE / TIME: 202402170314
Unit Type and Rh: 9500
Unit Type and Rh: 9500
Unit Type and Rh: 9500
Unit Type and Rh: 9500

## 2022-05-13 LAB — CBC
HCT: 29 % — ABNORMAL LOW (ref 36.0–46.0)
Hemoglobin: 9.6 g/dL — ABNORMAL LOW (ref 12.0–15.0)
MCH: 32.2 pg (ref 26.0–34.0)
MCHC: 33.1 g/dL (ref 30.0–36.0)
MCV: 97.3 fL (ref 80.0–100.0)
Platelets: 175 10*3/uL (ref 150–400)
RBC: 2.98 MIL/uL — ABNORMAL LOW (ref 3.87–5.11)
RDW: 13.1 % (ref 11.5–15.5)
WBC: 8.9 10*3/uL (ref 4.0–10.5)
nRBC: 0 % (ref 0.0–0.2)

## 2022-05-13 LAB — BASIC METABOLIC PANEL
Anion gap: 9 (ref 5–15)
BUN: 8 mg/dL (ref 8–23)
CO2: 23 mmol/L (ref 22–32)
Calcium: 8.8 mg/dL — ABNORMAL LOW (ref 8.9–10.3)
Chloride: 107 mmol/L (ref 98–111)
Creatinine, Ser: 0.67 mg/dL (ref 0.44–1.00)
GFR, Estimated: >60 mL/min (ref >60)
Glucose, Bld: 130 mg/dL — ABNORMAL HIGH (ref 70–99)
Potassium: 3.8 mmol/L (ref 3.5–5.1)
Sodium: 139 mmol/L (ref 135–145)

## 2022-05-13 LAB — TYPE AND SCREEN
ABO/RH(D): O NEG
Antibody Screen: NEGATIVE
Unit division: 0
Unit division: 0
Unit division: 0
Unit division: 0

## 2022-05-13 MED ORDER — OXYCODONE-ACETAMINOPHEN 5-325 MG PO TABS: 1.0000 | ORAL_TABLET | Freq: Four times a day (QID) | ORAL | 0 refills | Status: DC | PRN

## 2022-05-13 NOTE — Progress Notes (Signed)
Patient given discharge instructions and verbalized understanding. PIV x 1 removed. Patient dressed herself, discharged home with family.

## 2022-05-13 NOTE — Plan of Care (Signed)
  Problem: Education: Goal: Knowledge of discharge needs will improve Outcome: Adequate for Discharge   Problem: Clinical Measurements: Goal: Postoperative complications will be avoided or minimized Outcome: Adequate for Discharge   Problem: Respiratory: Goal: Ability to achieve and maintain a regular respiratory rate will improve Outcome: Adequate for Discharge   Problem: Skin Integrity: Goal: Demonstration of wound healing without infection will improve Outcome: Adequate for Discharge

## 2022-05-13 NOTE — Progress Notes (Addendum)
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 Scot Dock in 4 weeks with CTA Abd/Pelvis to check positioning and follow up for endo leaks.  Stable for discharge   Roxy Horseman 05/13/2022 7:54 AM --  Laboratory Lab Results: Recent Labs    05/12/22 0942 05/13/22 0340  WBC 8.3 8.9  HGB 10.4* 9.6*  HCT 30.0* 29.0*  PLT 187 175   BMET Recent Labs    05/12/22 0942 05/13/22 0340  NA 140 139  K 3.7 3.8  CL 109 107  CO2 20* 23  GLUCOSE 118* 130*  BUN 6* 8  CREATININE 0.56 0.67  CALCIUM 8.6* 8.8*    COAG Lab Results  Component Value Date   INR 1.2 05/12/2022   INR 1.0 05/10/2022   INR 1.0 03/21/2020   No results found for: "PTT"  I have interviewed the patient and examined the patient. I agree with the findings by the PA.  Agree with plans for discharge today.  Gae Gallop, MD

## 2022-05-13 NOTE — Discharge Instructions (Signed)
   [facility] [place]   Discharge Instructions  Endovascular Aortic Aneurysm Repair  Please refer to the following instructions for your post-procedure care. Your surgeon or Physician Assistant will discuss any changes with you.  Activity  You are encouraged to walk as much as you can. You can slowly return to normal activities but must avoid strenuous activity and heavy lifting until your doctor tells you it's OK. Avoid activities such as vacuuming or swinging a gold club. It is normal to feel tired for several weeks after your surgery. Do not drive until your doctor gives the OK and you are no longer taking prescription pain medications. It is also normal to have difficulty with sleep habits, eating, and bowel movements after surgery. These will go away with time.  Bathing/Showering  You may shower after you go home. If you have an incision, do not soak in a bathtub, hot tub, or swim until the incision heals completely.  Incision Care  Shower every day. Clean your incision with mild soap and water. Pat the area dry with a clean towel. You do not need a bandage unless otherwise instructed. Do not apply any ointments or creams to your incision. If you clothing is irritating, you may cover your incision with a dry gauze pad.  Diet  Resume your normal diet. There are no special food restrictions following this procedure. A low fat/low cholesterol diet is recommended for all patients with vascular disease. In order to heal from your surgery, it is CRITICAL to get adequate nutrition. Your body requires vitamins, minerals, and protein. Vegetables are the best source of vitamins and minerals. Vegetables also provide the perfect balance of protein. Processed food has little nutritional value, so try to avoid this.  Medications  Resume taking all of your medications unless your doctor or nurse practitioner tells you not to. If your incision is causing pain, you may take  over-the-counter pain relievers such as acetaminophen (Tylenol). If you were prescribed a stronger pain medication, please be aware these medications can cause nausea and constipation. Prevent nausea by taking the medication with a snack or meal. Avoid constipation by drinking plenty of fluids and eating foods with a high amount of fiber, such as fruits, vegetables, and grains. Do not take Tylenol if you are taking prescription pain medications.   Follow up  Our office will schedule a follow-up appointment with a C.T. scan 3-4 weeks after your surgery.  Please call us immediately for any of the following conditions  Severe or worsening pain in your legs or feet or in your abdomen back or chest. Increased pain, redness, drainage (pus) from your incision sit. Increased abdominal pain, bloating, nausea, vomiting or persistent diarrhea. Fever of 101 degrees or higher. Swelling in your leg (s),  Reduce your risk of vascular disease  Stop smoking. If you would like help call [facility] at 1-800-QUIT-NOW (1-800-784-8669) or [place] at [phone]. Manage your cholesterol Maintain a desired weight Control your diabetes Keep your blood pressure down  If you have questions, please call the office at [phone].   

## 2022-05-15 ENCOUNTER — Encounter (HOSPITAL_COMMUNITY): Payer: Self-pay | Admitting: Vascular Surgery

## 2022-05-15 NOTE — Discharge Summary (Cosign Needed Addendum)
[facility] Discharge Summary   Patient ID:  Deborah Peters MRN: [id number] DOB/AGE: 67-Jan-1957 67 y.o.  Admit date: 05/12/2022 Discharge date: 05/13/22 Date of Surgery: 05/12/2022 Surgeon: Surgeon(s): Angelia Mould, MD  Admission Diagnosis: AAA (abdominal aortic aneurysm) [facility]) [I71.40]  Discharge Diagnoses:  AAA (abdominal aortic aneurysm) ([place]) [I71.40]  Secondary Diagnoses: Past Medical History:  Diagnosis Date   Allergy    Anxiety    Chronic back pain    Constipation due to opioid therapy    COVID    1st one was severe, no hospitalization, 2nd and 3rd were mild   DDD (degenerative disc disease), lumbar    Depression    Diabetes mellitus, type 2 ([place])    GERD (gastroesophageal reflux disease)    past hx of    Gestational diabetes    no meds as of 06-01-2016.  diet controlled   Heart murmur    Heart murmur    Hyperlipidemia    Hypertension    pt this pain related - id occasionally high    IFG (impaired fasting glucose)    Migraines    no longer has migraines   Peripheral vascular disease ([place])    Pneumonia    Hx in 1990s   Post-menopausal    Scoliosis    Stroke [facility])    Right Upper Extremity Deficit   Ulcer    years ago. gastric ulcer.    Procedure(s): ABDOMINAL AORTIC ENDOVASCULAR STENT GRAFT  Discharged Condition: stable  HPI: 67 y/o female with asymptomatic Saccular aortic Aneurysms.  They were forund secondary to work up for abdominal pain.  A CTA was ordered and showed 2 small saccular aneurysms.   Given the saccular nature of these aneurysms the risk of rupture was significant and elective repair was recommended.    Hospital Course:  Deborah Peters is a 67 y.o. 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  She has  tolerated PO's, ambulating in her room independently, pain controlled She will be discharged today and f/u with Dr. Scot Dock in 4 weeks with CTA Abd/Pelvis to check positioning and follow up for endo leaks.  Stable for discharge.      Significant Diagnostic Studies: CBC Lab Results  Component Value Date   WBC 8.9 05/13/2022   HGB 9.6 (L) 05/13/2022   HCT 29.0 (L) 05/13/2022   MCV 97.3 05/13/2022   PLT 175 05/13/2022    BMET    Component Value Date/Time   NA 139 05/13/2022 0340   NA 144 11/25/2020 1505   K 3.8 05/13/2022 0340   CL 107 05/13/2022 0340   CO2 23 05/13/2022 0340   GLUCOSE 130 (H) 05/13/2022 0340   BUN 8 05/13/2022 0340   BUN 7 (L) 11/25/2020 1505   CREATININE 0.67 05/13/2022 0340   CALCIUM 8.8 (L) 05/13/2022 0340   GFRNONAA >60 05/13/2022 0340   GFRAA >60 12/21/2017 1409   COAG Lab Results  Component Value Date   INR 1.2 05/12/2022   INR 1.0 05/10/2022   INR 1.0 03/21/2020     Disposition:  Discharge to :Home Discharge Instructions     Call MD for:  redness, tenderness, or signs of infection (pain, swelling, bleeding, redness, odor or green/yellow discharge around incision site)   Complete by: As directed  Call MD for:  severe or increased pain, loss or decreased feeling  in affected limb(s)   Complete by: As directed    Call MD for:  temperature >100.5   Complete by: As directed    Resume previous diet   Complete by: As directed       Allergies as of 05/13/2022       Reactions   Sulfa Antibiotics Anaphylaxis   Ephedrine-guaifenesin Other (See Comments)   Primatene/ unknown   Simvastatin    felt terrible        Medication List     TAKE these medications    acetaminophen 500 MG tablet Commonly known as: TYLENOL Take 1,000 mg by mouth every 6 (six) hours as needed for moderate pain.   atorvastatin 80 MG tablet Commonly known as: LIPITOR Take 1 tablet (80 mg total) by mouth at bedtime.   b complex vitamins capsule Take 1  capsule by mouth daily. Raw  B complex   benzonatate 100 MG capsule Commonly known as: TESSALON Take 100 mg by mouth 3 (three) times daily as needed for cough.   cetirizine 10 MG tablet Commonly known as: ZYRTEC Take 10 mg by mouth daily.   CINNAMON PO Take 2 capsules by mouth daily.   clopidogrel 75 MG tablet Commonly known as: PLAVIX Take 75 mg by mouth daily.   Co Q-10 100 MG Caps Take 100 mg by mouth daily.   diazepam 5 MG tablet Commonly known as: VALIUM Take 5 mg by mouth 3 (three) times daily as needed for muscle spasms.   donepezil 5 MG tablet Commonly known as: ARICEPT Take 5 mg by mouth at bedtime.   DULoxetine 60 MG capsule Commonly known as: CYMBALTA Take 60 mg by mouth daily.   ezetimibe 10 MG tablet Commonly known as: ZETIA Take 10 mg by mouth daily.   Fish Oil 1200 MG Caps Take 2,400 mg by mouth 2 (two) times daily.   gabapentin 400 MG capsule Commonly known as: NEURONTIN Take 400-800 mg by mouth See admin instructions. Take 400 mg in the morning and and afternoon and 800 mg at bedtidme   loperamide 2 MG tablet Commonly known as: IMODIUM A-D Take 4 mg by mouth 2 (two) times daily as needed for diarrhea or loose stools.   losartan 25 MG tablet Commonly known as: COZAAR Take 50 mg by mouth every evening.   Magnesium 250 MG Tabs Take 250 mg by mouth at bedtime.   OVER THE COUNTER MEDICATION Take 1 tablet by mouth at bedtime as needed (sleep). alteril natural sleep aid   oxyCODONE-acetaminophen 5-325 MG tablet Commonly known as: PERCOCET/ROXICET Take 1 tablet by mouth every 6 (six) hours as needed for moderate pain.   promethazine 25 MG tablet Commonly known as: PHENERGAN Take 25 mg by mouth 2 (two) times daily as needed for nausea or vomiting.   Taurine 1000 MG Caps Take 1,000 mg by mouth 3 (three) times daily.   TURMERIC PO Take 1 tablet by mouth daily.   vitamin C 1000 MG tablet Take 500 mg by mouth daily.   vitamin D3 25 MCG  (1000 UT) tablet Take 1,000 Units by mouth daily.   zolpidem 6.25 MG CR tablet Commonly known as: AMBIEN CR Take 6.25 mg by mouth at bedtime as needed for sleep.       Verbal and written Discharge instructions given to the patient. Wound care per Discharge AVS  Follow-up [facility] Follow up.   Why: Latricia Heft)- HH protocol referral made pre-op by vascular - they will contact you post discharge to follow up Contact information: [place] [address] [phone]         Angelia Mould, MD Follow up in 4 week(s).   Specialties: Vascular Surgery, Cardiology Why: Office will call you to arrange your appt (sent) Contact information: [place] [id number] [phone]                 Signed: Roxy Horseman 05/15/2022, 7:52 AM - For VQI Registry use --- Instructions: Press F2 to tab through selections.  Delete question if not applicable.   Post-op:  Time to Extubation: [ ]$  In OR, [ ]$  < 12 hrs, [ ]$  12-24 hrs, [ ]$  >=24 hrs Vasopressors Req. Post-op: No MI: [x ] No, [ ]$  Troponin only, [ ]$  EKG or Clinical New Arrhythmia: No CHF: No ICU Stay: 0 days Transfusion: No  If yes, 0 units given  Complications: Resp failure: [x ] none, [ ]$  Pneumonia, [ ]$  Ventilator Chg in renal function: [ x] none, [ ]$  Inc. Cr > 0.5, [ ]$  Temp. Dialysis, [ ]$  Permanent dialysis Leg ischemia: [ x] No, [ ]$  Yes, no Surgery needed, [ ]$  Yes, Surgery needed, [ ]$  Amputation Bowel ischemia: [x ] No, [ ]$  Medical Rx, [ ]$  Surgical Rx Wound complication: [ ]$  No, [ ]$  Superficial separation/infection, [ ]$  Return to OR Return to OR: No  Return to OR for bleeding: No Stroke: [x ] None, [ ]$  Minor, [ ]$  Major  Discharge medications: Statin use:  yes ASA use:  no, not indicated Plavix use:  yes Beta blocker use:  NO not indicated

## 2022-05-16 ENCOUNTER — Telehealth: Payer: Self-pay

## 2022-05-16 NOTE — Telephone Encounter (Signed)
Pt called stating that she received a call from a [facility] agency, but didn't remember Dr. Scot Dock mentioning this during her hospital stay.  Reviewed pt's chart, returned call for clarification, no answer, lf vm informing her that per protocol, Dr. Scot Dock did send in a referral for [facility], so she needs to contact them to setup an appt.

## 2022-05-18 DIAGNOSIS — E119 Type 2 diabetes mellitus without complications: Secondary | ICD-10-CM | POA: Diagnosis not present

## 2022-05-18 DIAGNOSIS — M545 Low back pain, unspecified: Secondary | ICD-10-CM | POA: Diagnosis not present

## 2022-05-18 DIAGNOSIS — G5710 Meralgia paresthetica, unspecified lower limb: Secondary | ICD-10-CM | POA: Diagnosis not present

## 2022-05-18 DIAGNOSIS — E785 Hyperlipidemia, unspecified: Secondary | ICD-10-CM | POA: Diagnosis not present

## 2022-05-18 DIAGNOSIS — M5136 Other intervertebral disc degeneration, lumbar region: Secondary | ICD-10-CM | POA: Diagnosis not present

## 2022-05-18 DIAGNOSIS — K5903 Drug induced constipation: Secondary | ICD-10-CM | POA: Diagnosis not present

## 2022-05-18 DIAGNOSIS — I10 Essential (primary) hypertension: Secondary | ICD-10-CM | POA: Diagnosis not present

## 2022-05-18 DIAGNOSIS — Z48812 Encounter for surgical aftercare following surgery on the circulatory system: Secondary | ICD-10-CM | POA: Diagnosis not present

## 2022-05-18 DIAGNOSIS — M419 Scoliosis, unspecified: Secondary | ICD-10-CM | POA: Diagnosis not present

## 2022-05-18 DIAGNOSIS — F419 Anxiety disorder, unspecified: Secondary | ICD-10-CM | POA: Diagnosis not present

## 2022-05-19 ENCOUNTER — Telehealth: Payer: Self-pay

## 2022-05-19 NOTE — Telephone Encounter (Signed)
[facility] PT left VM on triage line that pt is no longer needing physical therapy, as she has an independent gate, is walking 20 min with her husband and able to go up/down stairs. Her OT order is also not needed.

## 2022-05-31 ENCOUNTER — Other Ambulatory Visit: Payer: Self-pay

## 2022-05-31 DIAGNOSIS — M47816 Spondylosis without myelopathy or radiculopathy, lumbar region: Secondary | ICD-10-CM | POA: Insufficient documentation

## 2022-05-31 DIAGNOSIS — R911 Solitary pulmonary nodule: Secondary | ICD-10-CM | POA: Insufficient documentation

## 2022-05-31 DIAGNOSIS — I7143 Infrarenal abdominal aortic aneurysm, without rupture: Secondary | ICD-10-CM

## 2022-05-31 DIAGNOSIS — R413 Other amnesia: Secondary | ICD-10-CM | POA: Insufficient documentation

## 2022-05-31 DIAGNOSIS — I639 Cerebral infarction, unspecified: Secondary | ICD-10-CM | POA: Insufficient documentation

## 2022-05-31 DIAGNOSIS — F1120 Opioid dependence, uncomplicated: Secondary | ICD-10-CM | POA: Insufficient documentation

## 2022-05-31 DIAGNOSIS — I700 Atherosclerosis of aorta: Secondary | ICD-10-CM | POA: Insufficient documentation

## 2022-05-31 DIAGNOSIS — K810 Acute cholecystitis: Secondary | ICD-10-CM | POA: Insufficient documentation

## 2022-05-31 DIAGNOSIS — I2510 Atherosclerotic heart disease of native coronary artery without angina pectoris: Secondary | ICD-10-CM | POA: Insufficient documentation

## 2022-05-31 DIAGNOSIS — M48061 Spinal stenosis, lumbar region without neurogenic claudication: Secondary | ICD-10-CM | POA: Insufficient documentation

## 2022-05-31 DIAGNOSIS — M5030 Other cervical disc degeneration, unspecified cervical region: Secondary | ICD-10-CM | POA: Insufficient documentation

## 2022-06-14 ENCOUNTER — Encounter (HOSPITAL_COMMUNITY): Payer: Self-pay

## 2022-06-14 ENCOUNTER — Ambulatory Visit (HOSPITAL_COMMUNITY)
Admission: RE | Admit: 2022-06-14 | Discharge: 2022-06-14 | Disposition: A | Discharge status: Home / Self Care | Payer: PPO | Source: Ambulatory Visit | Attending: Vascular Surgery | Admitting: Vascular Surgery

## 2022-06-14 DIAGNOSIS — I7143 Infrarenal abdominal aortic aneurysm, without rupture: Secondary | ICD-10-CM | POA: Diagnosis not present

## 2022-06-14 DIAGNOSIS — I700 Atherosclerosis of aorta: Secondary | ICD-10-CM | POA: Diagnosis not present

## 2022-06-14 MED ORDER — IOHEXOL 350 MG/ML SOLN
100.0000 mL | Freq: Once | INTRAVENOUS | Status: AC | PRN
  Administered 2022-06-14: 100 mL via INTRAVENOUS

## 2022-06-14 MED ORDER — SODIUM CHLORIDE (PF) 0.9 % IJ SOLN
INTRAMUSCULAR | Status: AC
  Filled 2022-06-14: qty 50

## 2022-06-15 ENCOUNTER — Encounter: Payer: PPO | Admitting: Vascular Surgery

## 2022-06-29 ENCOUNTER — Encounter: Payer: PPO | Admitting: Vascular Surgery

## 2022-07-10 ENCOUNTER — Other Ambulatory Visit (HOSPITAL_COMMUNITY): Payer: Self-pay | Admitting: Registered Nurse

## 2022-07-10 ENCOUNTER — Ambulatory Visit (HOSPITAL_COMMUNITY)
Admission: RE | Admit: 2022-07-10 | Discharge: 2022-07-10 | Disposition: A | Discharge status: Home / Self Care | Payer: PPO | Source: Ambulatory Visit | Attending: Registered Nurse | Admitting: Registered Nurse

## 2022-07-10 DIAGNOSIS — R221 Localized swelling, mass and lump, neck: Secondary | ICD-10-CM

## 2022-07-10 DIAGNOSIS — I639 Cerebral infarction, unspecified: Secondary | ICD-10-CM | POA: Diagnosis not present

## 2022-07-10 DIAGNOSIS — E1169 Type 2 diabetes mellitus with other specified complication: Secondary | ICD-10-CM | POA: Diagnosis not present

## 2022-07-10 DIAGNOSIS — J309 Allergic rhinitis, unspecified: Secondary | ICD-10-CM | POA: Diagnosis not present

## 2022-07-10 DIAGNOSIS — I671 Cerebral aneurysm, nonruptured: Secondary | ICD-10-CM | POA: Diagnosis not present

## 2022-07-10 DIAGNOSIS — M79601 Pain in right arm: Secondary | ICD-10-CM | POA: Diagnosis not present

## 2022-07-10 DIAGNOSIS — I700 Atherosclerosis of aorta: Secondary | ICD-10-CM | POA: Diagnosis not present

## 2022-07-10 DIAGNOSIS — I2510 Atherosclerotic heart disease of native coronary artery without angina pectoris: Secondary | ICD-10-CM | POA: Diagnosis not present

## 2022-07-10 DIAGNOSIS — I10 Essential (primary) hypertension: Secondary | ICD-10-CM | POA: Diagnosis not present

## 2022-07-10 LAB — POCT I-STAT CREATININE: Creatinine, Ser: 0.7 mg/dL (ref 0.44–1.00)

## 2022-07-10 MED ORDER — IOHEXOL 350 MG/ML SOLN
75.0000 mL | Freq: Once | INTRAVENOUS | Status: AC | PRN
  Administered 2022-07-10: 75 mL via INTRAVENOUS

## 2022-07-18 DIAGNOSIS — G4700 Insomnia, unspecified: Secondary | ICD-10-CM | POA: Diagnosis not present

## 2022-07-18 DIAGNOSIS — J309 Allergic rhinitis, unspecified: Secondary | ICD-10-CM | POA: Diagnosis not present

## 2022-07-18 DIAGNOSIS — I10 Essential (primary) hypertension: Secondary | ICD-10-CM | POA: Diagnosis not present

## 2022-07-18 DIAGNOSIS — M79601 Pain in right arm: Secondary | ICD-10-CM | POA: Diagnosis not present

## 2022-07-18 DIAGNOSIS — I700 Atherosclerosis of aorta: Secondary | ICD-10-CM | POA: Diagnosis not present

## 2022-07-18 DIAGNOSIS — M5136 Other intervertebral disc degeneration, lumbar region: Secondary | ICD-10-CM | POA: Diagnosis not present

## 2022-07-18 DIAGNOSIS — E1169 Type 2 diabetes mellitus with other specified complication: Secondary | ICD-10-CM | POA: Diagnosis not present

## 2022-07-18 DIAGNOSIS — R221 Localized swelling, mass and lump, neck: Secondary | ICD-10-CM | POA: Diagnosis not present

## 2022-07-18 DIAGNOSIS — J329 Chronic sinusitis, unspecified: Secondary | ICD-10-CM | POA: Diagnosis not present

## 2022-07-18 DIAGNOSIS — K219 Gastro-esophageal reflux disease without esophagitis: Secondary | ICD-10-CM | POA: Diagnosis not present

## 2022-07-18 DIAGNOSIS — I639 Cerebral infarction, unspecified: Secondary | ICD-10-CM | POA: Diagnosis not present

## 2022-07-18 DIAGNOSIS — I2510 Atherosclerotic heart disease of native coronary artery without angina pectoris: Secondary | ICD-10-CM | POA: Diagnosis not present

## 2022-07-25 ENCOUNTER — Other Ambulatory Visit: Payer: Self-pay | Admitting: Registered Nurse

## 2022-07-25 DIAGNOSIS — I10 Essential (primary) hypertension: Secondary | ICD-10-CM | POA: Diagnosis not present

## 2022-07-25 DIAGNOSIS — I639 Cerebral infarction, unspecified: Secondary | ICD-10-CM | POA: Diagnosis not present

## 2022-07-25 DIAGNOSIS — W19XXXA Unspecified fall, initial encounter: Secondary | ICD-10-CM | POA: Diagnosis not present

## 2022-07-25 DIAGNOSIS — I2510 Atherosclerotic heart disease of native coronary artery without angina pectoris: Secondary | ICD-10-CM | POA: Diagnosis not present

## 2022-07-25 DIAGNOSIS — R220 Localized swelling, mass and lump, head: Secondary | ICD-10-CM | POA: Diagnosis not present

## 2022-07-26 ENCOUNTER — Ambulatory Visit (HOSPITAL_BASED_OUTPATIENT_CLINIC_OR_DEPARTMENT_OTHER): Payer: PPO

## 2022-07-26 ENCOUNTER — Encounter (HOSPITAL_BASED_OUTPATIENT_CLINIC_OR_DEPARTMENT_OTHER): Payer: Self-pay

## 2022-08-03 ENCOUNTER — Encounter: Payer: Self-pay | Admitting: Vascular Surgery

## 2022-08-03 ENCOUNTER — Ambulatory Visit (INDEPENDENT_AMBULATORY_CARE_PROVIDER_SITE_OTHER): Payer: PPO | Admitting: Vascular Surgery

## 2022-08-03 VITALS — BP 121/71 | HR 89 | Temp 98.3°F | Resp 20 | Ht 65.0 in | Wt 137.0 lb

## 2022-08-03 DIAGNOSIS — I7143 Infrarenal abdominal aortic aneurysm, without rupture: Secondary | ICD-10-CM

## 2022-08-03 NOTE — Progress Notes (Signed)
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  Her CT scan shows an excellent result with no evidence of endoleak.  She will need a follow-up ultrasound of the abdomen in 1 year.  Of note there was some mild dilatation of the distal left internal iliac artery that measured 1.1 cm in diameter and is unchanged from 2019.  I explained that it would probably be best to get a follow-up CT angio of the abdomen and pelvis in 5 years.  Not sure this can be seen on ultrasound.  I did look at the images myself and this is very small.  Fortunately she is not a smoker.  She is on Plavix and is on a statin.  HPI:   Deborah Peters is a pleasant 67 y.o. 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  Given the saccular nature of these aneurysms elective repair was recommended.  On 05/12/2022 she underwent endovascular repair of her saccular aneurysms.  3 aortic cuffs were placed.  Since I saw her last, she denies any abdominal pain or back pain.  She denies claudication or rest pain.  Past Medical History:  Diagnosis Date   Allergy    Anxiety    Chronic back pain    Constipation due to opioid therapy    COVID    1st one was severe, no hospitalization, 2nd and 3rd were mild   DDD (degenerative disc disease), lumbar    Depression    Diabetes mellitus, type 2 ([facility])    GERD (gastroesophageal reflux disease)    past hx of    Gestational diabetes    no meds as of 06-01-2016.  diet controlled   Heart murmur    Heart murmur    Hyperlipidemia    Hypertension    pt this pain related - id occasionally high    IFG (impaired fasting glucose)    Migraines    no longer has migraines   Peripheral vascular disease  ([facility])    Pneumonia    Hx in 1990s   Post-menopausal    Scoliosis    Stroke [facility])    Right Upper Extremity Deficit   Ulcer    years ago. gastric ulcer.    Family History  Problem Relation Age of Onset   Stroke Mother    Heart failure Father        Triple bypass   Melanoma Father    Colon polyps Sister    Colon polyps Brother    Cancer Maternal Grandmother    Cancer Maternal Grandfather    Stomach cancer Paternal Grandfather    Esophageal cancer Neg Hx    Rectal cancer Neg Hx    Colon cancer Neg Hx    Pancreatic cancer Neg Hx     SOCIAL HISTORY: Social History   Tobacco Use   Smoking status: Former    Types: Cigarettes    Quit date: 1991    Years since quitting: 33.3    Passive exposure: Current   Smokeless tobacco: Never  Substance Use Topics   Alcohol use: Not Currently    Comment: Rare    Allergies  Allergen Reactions   Sulfa Antibiotics Anaphylaxis   Ephedrine-Guaifenesin Other (See Comments)  Primatene/ unknown      Simvastatin     felt terrible    Current Outpatient Medications  Medication Sig Dispense Refill   acetaminophen (TYLENOL) 500 MG tablet Take 1,000 mg by mouth every 6 (six) hours as needed for moderate pain.     Ascorbic Acid (VITAMIN C) 1000 MG tablet Take 500 mg by mouth daily.     b complex vitamins capsule Take 1 capsule by mouth daily. Raw  B complex     benzonatate (TESSALON) 100 MG capsule Take 100 mg by mouth 3 (three) times daily as needed for cough.     cetirizine (ZYRTEC) 10 MG tablet Take 10 mg by mouth daily.     CINNAMON PO Take 2 capsules by mouth daily.     clopidogrel (PLAVIX) 75 MG tablet Take 75 mg by mouth daily.     Coenzyme Q10 (CO Q-10) 100 MG CAPS Take 100 mg by mouth daily.     diazepam (VALIUM) 5 MG tablet Take 5 mg by mouth 3 (three) times daily as needed for muscle spasms.     donepezil (ARICEPT) 5 MG tablet Take 5 mg by mouth at bedtime.     DULoxetine (CYMBALTA) 60 MG capsule Take 60 mg by mouth daily.      ezetimibe (ZETIA) 10 MG tablet Take 10 mg by mouth daily.  3   gabapentin (NEURONTIN) 400 MG capsule Take 400-800 mg by mouth See admin instructions. Take 400 mg in the morning and and afternoon and 800 mg at bedtidme     loperamide (IMODIUM A-D) 2 MG tablet Take 4 mg by mouth 2 (two) times daily as needed for diarrhea or loose stools.     losartan (COZAAR) 25 MG tablet Take 50 mg by mouth every evening.     Magnesium 250 MG TABS Take 250 mg by mouth at bedtime.     Omega-3 Fatty Acids (FISH OIL) 1200 MG CAPS Take 2,400 mg by mouth 2 (two) times daily.     OVER THE COUNTER MEDICATION Take 1 tablet by mouth at bedtime as needed (sleep). alteril natural sleep aid     promethazine (PHENERGAN) 25 MG tablet Take 25 mg by mouth 2 (two) times daily as needed for nausea or vomiting.     Taurine 1000 MG CAPS Take 1,000 mg by mouth 3 (three) times daily.     TURMERIC PO Take 1 tablet by mouth daily.     vitamin D3 (CHOLECALCIFEROL) 25 MCG tablet Take 1,000 Units by mouth daily.     zolpidem (AMBIEN CR) 6.25 MG CR tablet Take 6.25 mg by mouth at bedtime as needed for sleep.  5   atorvastatin (LIPITOR) 80 MG tablet Take 1 tablet (80 mg total) by mouth at bedtime. 30 tablet 1   No current facility-administered medications for this visit.    REVIEW OF SYSTEMS:  [X]  denotes positive finding, [ ]  denotes negative finding Cardiac  Comments:  Chest pain or chest pressure:    Shortness of breath upon exertion:    Short of breath when lying flat:    Irregular heart rhythm:        Vascular    Pain in calf, thigh, or hip brought on by ambulation:    Pain in feet at night that wakes you up from your sleep:     Blood clot in your veins:    Leg swelling:         Pulmonary    Oxygen at home:  Productive cough:     Wheezing:         Neurologic    Sudden weakness in arms or legs:     Sudden numbness in arms or legs:     Sudden onset of difficulty speaking or slurred speech:    Temporary loss of  vision in one eye:     Problems with dizziness:         Gastrointestinal    Blood in stool:     Vomited blood:         Genitourinary    Burning when urinating:     Blood in urine:        Psychiatric    Major depression:         Hematologic    Bleeding problems:    Problems with blood clotting too easily:        Skin    Rashes or ulcers:        Constitutional    Fever or chills:     PHYSICAL EXAM:   Vitals:   08/03/22 0826  BP: 121/71  Pulse: 89  Resp: 20  Temp: 98.3 F (36.8 C)  SpO2: 96%  Weight: 137 lb (62.1 kg)  Height: 5\' 5"  (1.651 m)    GENERAL: The patient is a well-nourished female, in no acute distress. The vital signs are documented above. CARDIAC: There is a regular rate and rhythm.  VASCULAR: I do not detect carotid bruits. She has palpable femoral pulses and palpable dorsalis pedis and posterior tibial pulses bilaterally. Her groin sites look fine. PULMONARY: There is good air exchange bilaterally without wheezing or rales. ABDOMEN: Soft and non-tender with normal pitched bowel sounds.  MUSCULOSKELETAL: There are no major deformities or cyanosis. NEUROLOGIC: No focal weakness or paresthesias are detected. SKIN: There are no ulcers or rashes noted. PSYCHIATRIC: The patient has a normal affect.  DATA:    CT ANGIO ABDOMEN PELVIS: I reviewed her CT angio that was done on 05/12/2022.  This shows that her endograft is in excellent position with no evidence of endoleak.  There is some slight dilatation of the distal left internal iliac artery that measures 1.1 cm in maximum diameter.  Waverly Ferrari [facility] [phone]

## 2022-08-08 ENCOUNTER — Other Ambulatory Visit: Payer: Self-pay | Admitting: Obstetrics and Gynecology

## 2022-08-08 DIAGNOSIS — N763 Subacute and chronic vulvitis: Secondary | ICD-10-CM | POA: Diagnosis not present

## 2022-08-08 DIAGNOSIS — N901 Moderate vulvar dysplasia: Secondary | ICD-10-CM | POA: Diagnosis not present

## 2022-08-15 ENCOUNTER — Other Ambulatory Visit: Payer: Self-pay

## 2022-08-15 DIAGNOSIS — I713 Abdominal aortic aneurysm, ruptured, unspecified: Secondary | ICD-10-CM

## 2022-09-04 DIAGNOSIS — N901 Moderate vulvar dysplasia: Secondary | ICD-10-CM | POA: Diagnosis not present

## 2022-09-19 ENCOUNTER — Encounter: Payer: Self-pay | Admitting: Cardiology

## 2022-10-02 NOTE — Progress Notes (Signed)
Fax received from [facility] on 09/15/22 for medical clearance/medication hold for wide local excision of the vulva to be signed by C. Edilia Bo, MD.  Provider signed on 09/20/22, form faxed back to sender on 09/22/22, verified successful, sent to scan center.

## 2022-10-06 DIAGNOSIS — R102 Pelvic and perineal pain: Secondary | ICD-10-CM | POA: Diagnosis not present

## 2022-10-06 DIAGNOSIS — N901 Moderate vulvar dysplasia: Secondary | ICD-10-CM | POA: Diagnosis not present

## 2022-10-10 ENCOUNTER — Telehealth: Payer: Self-pay | Admitting: *Deleted

## 2022-10-10 DIAGNOSIS — R102 Pelvic and perineal pain: Secondary | ICD-10-CM | POA: Diagnosis not present

## 2022-10-10 NOTE — Telephone Encounter (Signed)
Patient called back regarding the referral to GYN oncology. Patient scheduled as new patient with Dr Alvester Morin on 7/29 at 10:30 am. Patient given an arrival time of 10:10 am.  Explained to the patient the the doctor will perform a pelvic exam at this visit. Patient given the policy that only one visitor allowed and that visitor must be over 16 yrs are allowed in the Cancer Center. Patient given the address/phone number for the clinic and that the center offers free valet service. Patient aware that masks are option.

## 2022-10-10 NOTE — Telephone Encounter (Signed)
Attempted to reach the patient to schedule a new patient appt with Dr Alvester Morin on 7/29. LMOM for the patient to call the office back

## 2022-10-18 ENCOUNTER — Encounter (HOSPITAL_BASED_OUTPATIENT_CLINIC_OR_DEPARTMENT_OTHER): Admission: RE | Payer: Self-pay | Source: Home / Self Care

## 2022-10-18 ENCOUNTER — Ambulatory Visit (HOSPITAL_BASED_OUTPATIENT_CLINIC_OR_DEPARTMENT_OTHER): Admission: RE | Admit: 2022-10-18 | Payer: PPO | Source: Home / Self Care | Admitting: Obstetrics and Gynecology

## 2022-10-18 SURGERY — VULVECTOMY
Anesthesia: Choice

## 2022-10-23 ENCOUNTER — Inpatient Hospital Stay: Payer: PPO | Admitting: Psychiatry

## 2022-10-23 ENCOUNTER — Inpatient Hospital Stay: Payer: PPO

## 2022-10-23 ENCOUNTER — Other Ambulatory Visit: Payer: Self-pay

## 2022-10-23 ENCOUNTER — Encounter: Payer: Self-pay | Admitting: Psychiatry

## 2022-10-23 VITALS — BP 138/82 | HR 78 | Temp 98.5°F | Resp 18 | Wt 139.2 lb

## 2022-10-23 DIAGNOSIS — R350 Frequency of micturition: Secondary | ICD-10-CM | POA: Insufficient documentation

## 2022-10-23 DIAGNOSIS — R011 Cardiac murmur, unspecified: Secondary | ICD-10-CM | POA: Insufficient documentation

## 2022-10-23 DIAGNOSIS — F32A Depression, unspecified: Secondary | ICD-10-CM | POA: Insufficient documentation

## 2022-10-23 DIAGNOSIS — I10 Essential (primary) hypertension: Secondary | ICD-10-CM | POA: Diagnosis not present

## 2022-10-23 DIAGNOSIS — M5136 Other intervertebral disc degeneration, lumbar region: Secondary | ICD-10-CM | POA: Insufficient documentation

## 2022-10-23 DIAGNOSIS — M419 Scoliosis, unspecified: Secondary | ICD-10-CM | POA: Diagnosis not present

## 2022-10-23 DIAGNOSIS — E785 Hyperlipidemia, unspecified: Secondary | ICD-10-CM | POA: Diagnosis not present

## 2022-10-23 DIAGNOSIS — Z8673 Personal history of transient ischemic attack (TIA), and cerebral infarction without residual deficits: Secondary | ICD-10-CM | POA: Insufficient documentation

## 2022-10-23 DIAGNOSIS — K5903 Drug induced constipation: Secondary | ICD-10-CM | POA: Diagnosis not present

## 2022-10-23 DIAGNOSIS — K219 Gastro-esophageal reflux disease without esophagitis: Secondary | ICD-10-CM | POA: Diagnosis not present

## 2022-10-23 DIAGNOSIS — L83 Acanthosis nigricans: Secondary | ICD-10-CM | POA: Diagnosis not present

## 2022-10-23 DIAGNOSIS — I719 Aortic aneurysm of unspecified site, without rupture: Secondary | ICD-10-CM | POA: Insufficient documentation

## 2022-10-23 DIAGNOSIS — G8929 Other chronic pain: Secondary | ICD-10-CM | POA: Insufficient documentation

## 2022-10-23 DIAGNOSIS — R234 Changes in skin texture: Secondary | ICD-10-CM | POA: Diagnosis not present

## 2022-10-23 DIAGNOSIS — T402X5S Adverse effect of other opioids, sequela: Secondary | ICD-10-CM | POA: Diagnosis not present

## 2022-10-23 DIAGNOSIS — M549 Dorsalgia, unspecified: Secondary | ICD-10-CM | POA: Diagnosis not present

## 2022-10-23 DIAGNOSIS — F419 Anxiety disorder, unspecified: Secondary | ICD-10-CM | POA: Insufficient documentation

## 2022-10-23 DIAGNOSIS — E119 Type 2 diabetes mellitus without complications: Secondary | ICD-10-CM | POA: Diagnosis not present

## 2022-10-23 DIAGNOSIS — N901 Moderate vulvar dysplasia: Secondary | ICD-10-CM | POA: Insufficient documentation

## 2022-10-23 DIAGNOSIS — Z8616 Personal history of COVID-19: Secondary | ICD-10-CM | POA: Insufficient documentation

## 2022-10-23 DIAGNOSIS — I739 Peripheral vascular disease, unspecified: Secondary | ICD-10-CM | POA: Diagnosis not present

## 2022-10-23 LAB — URINALYSIS, COMPLETE (UACMP) WITH MICROSCOPIC
Bilirubin Urine: NEGATIVE
Glucose, UA: NEGATIVE mg/dL
Hgb urine dipstick: NEGATIVE
Ketones, ur: NEGATIVE mg/dL
Leukocytes,Ua: NEGATIVE
Nitrite: NEGATIVE
Protein, ur: NEGATIVE mg/dL
Specific Gravity, Urine: 1.004 — ABNORMAL LOW (ref 1.005–1.030)
pH: 6 (ref 5.0–8.0)

## 2022-10-23 MED ORDER — NITROFURANTOIN MONOHYD MACRO 100 MG PO CAPS: 100.0000 mg | ORAL_CAPSULE | Freq: Two times a day (BID) | ORAL | 0 refills | Status: AC

## 2022-10-23 NOTE — Progress Notes (Unsigned)
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  We reviewed the nature of vulvar dysplasia. Surgical excision is the mainstay of treatment, but ablative therapy or pharmacologic treatment is an option for some patients in certain clinical scenarios. The goals of treatment of vulvar dysplasia are to prevent development of vulvar squamous carcinoma and relieve any associated symptoms, such as pain or itching. The goal is also preserve vulvar anatomy as best as possible.  Excision provides both treatment and a diagnostic specimen for those women with high-grade dysplasia. Invasive squamous cell carcinoma may be present at the time of excision in some women with VIN on initial biopsy.    Reviewed findings from colposcopic exam today.  Broad involvement of acetowhite changes but many of these areas of appear most consistent with mild dysplasia with few areas more concerning for grade 2 or 3 VIN.  No discrete lesions apart from acetowhite changes, so at this time, if additional biopsy show high-grade dysplasia, could consider laser to treat more broadly than necessarily requiring surgical excision for all areas as that would require a larger resection and more challenging repair of the perineal body.  If these additional biopsies show only low-grade changes, could consider excision of the previously biopsied area for VIN 2.  While patient return next week to review results and determine final treatment plan pending biopsy results.  Additionally, patient notes new onset urinary frequency today.  Urinalysis collected.  Rare bacteria noted in urine.  Macrobid sent.  Urine was not sent for culture, so patient instructed to let us know if no improvement in symptoms or if she has worsening symptoms, fever/chills.  A copy of this note was sent to the patient's referring  provider.  Clide Cliff, MD Gynecologic Oncology   Medical Decision Making I personally spent  TOTAL 50 minutes face-to-face and non-face-to-face in the care of this patient, which includes all pre, intra, and post visit time on the date of service.    ------------  CC: VIN 2  HISTORY OF PRESENT ILLNESS:  Deborah Peters is a 67 y.o. woman who is seen in consultation at the request of Gerald Leitz, MD for evaluation of VIN 2.  Patient presented to her OB/GYN on 12/01/2021 for occasional vulvar itching around the labia.  She had previously reported a history of VIN status post excision in 1996.  She returned on 08/08/2022 vulvar biopsy.  Biopsy was obtained of the right labia majora which returned with VIN 2.  Plan was for patient to undergo a wide local excision on 10/18/2022; however, decision was made instead to refer to GYN oncology.  Today patient presents with her husband.  She reports constant vulvar itching for the past year.  She reports that she was trialed on some steroid cream which helped mostly when it was used, but the itching came back immediately after stopping.  She feels that there have been bumps on the vulva that of come and go, but no persistent growths.  She reports occasional bleeding when wiping.  She notes that the worst itching is on her perineum.  She additionally reports fatigue, diarrhea, and today she began experiencing urinary frequency.    PAST MEDICAL HISTORY: Past Medical History:  Diagnosis Date   Allergy    Anxiety    Aortic aneurysm (HCC)    Chronic back pain    Constipation due  to opioid therapy    COVID    1st one was severe, no hospitalization, 2nd and 3rd were mild   DDD (degenerative disc disease), lumbar    Depression    Diabetes mellitus, type 2 (HCC)    GERD (gastroesophageal reflux disease)    past hx of    Gestational diabetes    no meds as of 06-01-2016.  diet controlled   Heart murmur    Heart murmur    Hyperlipidemia     Hypertension    pt this pain related - id occasionally high    IFG (impaired fasting glucose)    Migraines    no longer has migraines   Peripheral vascular disease (HCC)    Pneumonia    Hx in 1990s   Post-menopausal    Scoliosis    Stroke [facility])    Right Upper Extremity Deficit   Ulcer    years ago. gastric ulcer.    PAST SURGICAL HISTORY: Past Surgical History:  Procedure Laterality Date   ABDOMINAL AORTIC ENDOVASCULAR STENT GRAFT N/A 05/12/2022   Procedure: ABDOMINAL AORTIC ENDOVASCULAR STENT GRAFT;  Surgeon: Chuck Hint, MD;  Location: [facility] OR;  Service: Vascular;  Laterality: N/A;   ABDOMINAL HYSTERECTOMY     has her ovaries, heavy periods   BACK SURGERY  2011   L5 S1   CHOLECYSTECTOMY N/A 12/05/2021   Procedure: LAPAROSCOPIC CHOLECYSTECTOMY;  Surgeon: Axel Filler, MD;  Location: [facility] OR;  Service: General;  Laterality: N/A;   COLONOSCOPY  2021   JOINT REPLACEMENT     L knee   knee surgeries     x 8 before replacement    POLYPECTOMY     HPP in 2008 DB - colon    OB/GYN HISTORY: OB History  Gravida Para Term Preterm AB Living  3 1 1   2 1   SAB IAB Ectopic Multiple Live Births  2       1    # Outcome Date GA Lbr Len/2nd Weight Sex Type Anes PTL Lv  3 SAB           2 SAB           1 Term      Vag-Spont   LIV      Age at menarche: 89 Age at menopause: 45 Hx of HRT: No Hx of STI: HPV 1981 Last pap: Around 2007, prior to hysterectomy History of abnormal pap smears: yes in 20's, s/p freezing, normal Pap smears following  SCREENING STUDIES:  Last mammogram: Approximately couple years ago; due Last colonoscopy: 05/2016 (due last year)  MEDICATIONS:  Current Outpatient Medications:    acetaminophen (TYLENOL) 500 MG tablet, Take 1,000 mg by mouth every 6 (six) hours as needed for moderate pain., Disp: , Rfl:    Ascorbic Acid (VITAMIN C) 1000 MG tablet, Take 500 mg by mouth daily., Disp: , Rfl:    atorvastatin (LIPITOR) 80 MG tablet, Take 1 tablet  (80 mg total) by mouth at bedtime., Disp: 30 tablet, Rfl: 1   b complex vitamins capsule, Take 1 capsule by mouth daily. Raw  B complex, Disp: , Rfl:    benzonatate (TESSALON) 100 MG capsule, Take 100 mg by mouth 3 (three) times daily as needed for cough., Disp: , Rfl:    cetirizine (ZYRTEC) 10 MG tablet, Take 10 mg by mouth daily., Disp: , Rfl:    CINNAMON PO, Take 2 capsules by mouth daily., Disp: , Rfl:    clopidogrel (PLAVIX) 75 MG  tablet, Take 75 mg by mouth daily., Disp: , Rfl:    Coenzyme Q10 (CO Q-10) 100 MG CAPS, Take 100 mg by mouth daily., Disp: , Rfl:    diazepam (VALIUM) 5 MG tablet, Take 5 mg by mouth 3 (three) times daily as needed for muscle spasms., Disp: , Rfl:    donepezil (ARICEPT) 5 MG tablet, Take 5 mg by mouth at bedtime., Disp: , Rfl:    DULoxetine (CYMBALTA) 60 MG capsule, Take 60 mg by mouth daily., Disp: , Rfl:    ezetimibe (ZETIA) 10 MG tablet, Take 10 mg by mouth daily., Disp: , Rfl: 3   gabapentin (NEURONTIN) 400 MG capsule, Take 400-800 mg by mouth See admin instructions. Take 400 mg in the morning and and afternoon and 800 mg at bedtidme, Disp: , Rfl:    loperamide (IMODIUM A-D) 2 MG tablet, Take 4 mg by mouth 2 (two) times daily as needed for diarrhea or loose stools., Disp: , Rfl:    losartan (COZAAR) 25 MG tablet, Take 50 mg by mouth every evening., Disp: , Rfl:    Magnesium 250 MG TABS, Take 250 mg by mouth at bedtime., Disp: , Rfl:    nitrofurantoin, macrocrystal-monohydrate, (MACROBID) 100 MG capsule, Take 1 capsule (100 mg total) by mouth 2 (two) times daily for 5 days., Disp: 10 capsule, Rfl: 0   Omega-3 Fatty Acids (FISH OIL) 1200 MG CAPS, Take 2,400 mg by mouth 2 (two) times daily., Disp: , Rfl:    OVER THE COUNTER MEDICATION, Take 1 tablet by mouth at bedtime as needed (sleep). alteril natural sleep aid, Disp: , Rfl:    promethazine (PHENERGAN) 25 MG tablet, Take 25 mg by mouth 2 (two) times daily as needed for nausea or vomiting., Disp: , Rfl:    Taurine  1000 MG CAPS, Take 1,000 mg by mouth 3 (three) times daily., Disp: , Rfl:    TURMERIC PO, Take 1 tablet by mouth daily., Disp: , Rfl:    vitamin D3 (CHOLECALCIFEROL) 25 MCG tablet, Take 1,000 Units by mouth daily., Disp: , Rfl:    zolpidem (AMBIEN CR) 6.25 MG CR tablet, Take 6.25 mg by mouth at bedtime as needed for sleep., Disp: , Rfl: 5  ALLERGIES: Allergies  Allergen Reactions   Sulfa Antibiotics Anaphylaxis   Ephedrine-Guaifenesin Other (See Comments)    Primatene/ unknown      Simvastatin     felt terrible    FAMILY HISTORY: Family History  Problem Relation Age of Onset   Stroke Mother    Heart failure Father        Triple bypass   Melanoma Father    Colon polyps Sister    Colon polyps Brother    Cancer Maternal Grandmother        unknown type of cancer   Cancer Maternal Grandfather        unknown type of cancer   Stomach cancer Paternal Grandfather    Esophageal cancer Neg Hx    Rectal cancer Neg Hx    Colon cancer Neg Hx    Pancreatic cancer Neg Hx    Breast cancer Neg Hx    Ovarian cancer Neg Hx    Endometrial cancer Neg Hx    Prostate cancer Neg Hx     SOCIAL HISTORY: Social History   Socioeconomic History   Marital status: Married    Spouse name: Not on file   Number of children: 2   Years of education: Not on file   Highest education  level: Not on file  Occupational History   Occupation: disabled  Tobacco Use   Smoking status: Former    Current packs/day: 0.00    Average packs/day: 1 pack/day for 18.0 years (18.0 ttl pk-yrs)    Types: Cigarettes    Start date: 40    Quit date: 57    Years since quitting: 33.6    Passive exposure: Current   Smokeless tobacco: Never   Tobacco comments:    Occasional cigarette since quitting when younger  Vaping Use   Vaping status: Never Used  Substance and Sexual Activity   Alcohol use: Not Currently    Comment: Rare   Drug use: No   Sexual activity: Yes  Other Topics Concern   Not on file   Social History Narrative   Not on file   Social Determinants of Health   Financial Resource Strain: Not on file  Food Insecurity: Not on file  Transportation Needs: Not on file  Physical Activity: Not on file  Stress: Not on file  Social Connections: Unknown (08/05/2021)   [facility], [facility]   Social Network    Social Network: Not on file  Intimate Partner Violence: Unknown (06/29/2021)   Received from [facility], [facility]   HITS    Physically Hurt: Not on file    Insult or Talk Down To: Not on file    Threaten Physical Harm: Not on file    Scream or Curse: Not on file    REVIEW OF SYSTEMS: New patient intake form was reviewed.  Complete 10-system review is negative except for the following: Headache, lump, diarrhea, pelvic pain, fatigue, abdominal pain, unexpected weight loss, ringing in ears, itching, night sweats  PHYSICAL EXAM: BP 138/82 Comment: manual recheck  Pulse 78   Temp 98.5 F (36.9 C)   Resp 18   Wt 139 lb 3.2 oz (63.1 kg)   SpO2 98%   BMI 23.16 kg/m  Constitutional: No acute distress. Neuro/Psych: Alert, oriented.  Head and Neck: Normocephalic, atraumatic. Neck symmetric without masses. Sclera anicteric.  Respiratory: Normal work of breathing. Clear to auscultation bilaterally. Cardiovascular: Regular rate and rhythm, no murmurs, rubs, or gallops. Abdomen: Normoactive bowel sounds. Soft, non-distended, non-tender to palpation. No masses appreciated.  Well-healed Pfannenstiel incision. Extremities: Grossly normal range of motion. Warm, well perfused. No edema bilaterally. Skin: No rashes or lesions. Lymphatic: No cervical, supraclavicular, or inguinal adenopathy. Genitourinary: External genitalia without obvious lesion.  Area of posterior right labia majora healed from prior biopsy. Urethral meatus without lesions or prolapse. On speculum exam, vagina atrophic without lesions. Bimanual exam reveals smooth vaginal cuff.  No  pelvic mass or nodularity. Exam chaperoned by Leta Baptist, RN  COLPOSCOPY PROCEDURE NOTE  Procedure Details: After appropriate verbal informed consent was obtained, a timeout was performed. Acetic acid was applied to the vulva with the findings as noted below. The vulva was then cleaned with betadine x3.  Using a 3 mm punch biopsy, 2 biopsies were obtained.  Silver nitrite was applied with hemostasis achieved.  Vulva was cleansed of the acetic acid with water.  The patient tolerated the procedure well.   Adequate Exam: Yes  Biopsy Specimen: Right perineum and left perineum  Condition: Stable. Patient tolerated procedure well.  Complications: None  Findings: Mild acetowhite changes along the right fold between the labia majora and labia minora.  Additional more broad acetowhite changes across the perineal body, mostly mild in appearance with slightly denser acetowhite changes of the left  anterior perineal body.  Colposcopic Impression: Low-grade dysplasia mostly, possible VIN2 in some areas  Physical Exam Genitourinary:      LABORATORY AND RADIOLOGIC DATA: Outside medical records were reviewed to synthesize the above history, along with the history and physical obtained during the visit.  Outside laboratory, pathology reports were reviewed, with pertinent results below.   WBC  Date Value Ref Range Status  05/13/2022 8.9 4.0 - 10.5 K/uL Final   Hemoglobin  Date Value Ref Range Status  05/13/2022 9.6 (L) 12.0 - 15.0 g/dL Final   HCT  Date Value Ref Range Status  05/13/2022 29.0 (L) 36.0 - 46.0 % Final   Platelets  Date Value Ref Range Status  05/13/2022 175 150 - 400 K/uL Final   Magnesium  Date Value Ref Range Status  05/12/2022 1.8 1.7 - 2.4 mg/dL Final    Comment:    Performed at [facility], [address] [place], [place] [address]   Creatinine, Ser  Date Value Ref Range Status  07/10/2022 0.70 0.44 - 1.00 mg/dL Final   AST  Date Value Ref Range  Status  05/10/2022 20 15 - 41 U/L Final   ALT  Date Value Ref Range Status  05/10/2022 18 0 - 44 U/L Final

## 2022-10-23 NOTE — Patient Instructions (Signed)
It was a pleasure to see you in clinic today. - I took 2 additional biopsies today - We talked about surgery and laser for treatment - Return visit planned for 1 week to review results and formulate final surgical plan  Thank you very much for allowing me to provide care for you today.  I appreciate your confidence in choosing our Gynecologic Oncology team at [facility].  If you have any questions about your visit today please call our office or send [place] a MyChart message and we will get back to you as soon as possible.

## 2022-10-24 ENCOUNTER — Encounter: Payer: Self-pay | Admitting: Psychiatry

## 2022-10-30 ENCOUNTER — Inpatient Hospital Stay (HOSPITAL_BASED_OUTPATIENT_CLINIC_OR_DEPARTMENT_OTHER): Payer: PPO | Admitting: Gynecologic Oncology

## 2022-10-30 ENCOUNTER — Other Ambulatory Visit: Payer: Self-pay

## 2022-10-30 ENCOUNTER — Encounter: Payer: Self-pay | Admitting: Psychiatry

## 2022-10-30 ENCOUNTER — Inpatient Hospital Stay: Payer: PPO | Attending: Psychiatry | Admitting: Psychiatry

## 2022-10-30 VITALS — BP 141/71 | HR 82 | Temp 99.0°F | Resp 17 | Ht 65.0 in | Wt 138.0 lb

## 2022-10-30 DIAGNOSIS — Z7189 Other specified counseling: Secondary | ICD-10-CM | POA: Diagnosis not present

## 2022-10-30 DIAGNOSIS — R197 Diarrhea, unspecified: Secondary | ICD-10-CM | POA: Insufficient documentation

## 2022-10-30 DIAGNOSIS — N901 Moderate vulvar dysplasia: Secondary | ICD-10-CM

## 2022-10-30 DIAGNOSIS — Z7902 Long term (current) use of antithrombotics/antiplatelets: Secondary | ICD-10-CM | POA: Diagnosis not present

## 2022-10-30 MED ORDER — SENNOSIDES-DOCUSATE SODIUM 8.6-50 MG PO TABS: 2.0000 | ORAL_TABLET | Freq: Every day | ORAL | 0 refills | Status: DC

## 2022-10-30 MED ORDER — TRAMADOL HCL 50 MG PO TABS: 50.0000 mg | ORAL_TABLET | Freq: Four times a day (QID) | ORAL | 0 refills | Status: DC | PRN

## 2022-10-30 NOTE — Progress Notes (Signed)
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  We reviewed her exam findings from last visit. Reviewed her biopsy results.  Given that these additional biopsies only show reactive changes and VIN1 at most, reviewed that her treatment does not have to be very extensive. Can focus on the area of the original biopsy that showed VIN2. Feel that this area is quite small and the more broad changes are more consistent with low grade changes which seems to be consistent with what we found on biopsy.  Given this, recommend proceeding with surgical management of VIN2. Patient is in agreement with this. Reviewed that excision provides both treatment and a diagnostic specimen for those women with high-grade dysplasia.   Patient was consented for: simple partial vulvectomy on 11/21/22.  The risks of surgery were discussed in detail and she understands these to including but not limited to bleeding requiring a blood transfusion, infection, injury to adjacent organs (including but not limited to the rectum, vagina, nearby vessels/nerves), unforseen complication, and possible need for re-exploration.  If the patient experiences any of these events, she understands that her hospitalization or recovery may be prolonged and that she may need to take additional medications for a prolonged period. The patient will receive DVT and antibiotic prophylaxis as indicated. She voiced a clear understanding. She had the opportunity to ask questions and informed consent was obtained today. She wishes to proceed.  Patient may need to increase immodium postop to help with keeping surgical site clean. Pt has had diarrhea since her gallbladder surgery. Reviewed that she should discuss this with her PCP. May benefit from cholestyramine but will defer to PCP.  Patient on plavix. Will check with cardiology team  for perioperative recs.   She does not require preoperative clearance. Her METs are >4.  All preoperative instructions were reviewed. Postoperative expectations were also reviewed. Written handouts were provided to the patient.  RTC postop.  Clide Cliff, MD Gynecologic Oncology   Medical Decision Making I personally spent  TOTAL 30 minutes face-to-face and non-face-to-face in the care of this patient, which includes all pre, intra, and post visit time on the date of service.    ----------------------- Reason for Visit: Follow-up, treatment discussion  Treatment History: Patient presented to her OB/GYN on 12/01/2021 for occasional vulvar itching around the labia.  She had previously reported a history of VIN status post excision in 1996.  She returned on 08/08/2022 vulvar biopsy.  Biopsy was obtained of the right labia majora which returned with VIN 2.  Plan was for patient to undergo a wide local excision on 10/18/2022; however, decision was made instead to refer to GYN oncology.   Interval History: Patient reports that she has some soreness following her vulvar biopsies but no significant pain. She has some discharge. But otherwise no new concerns today since last visit.    Past Medical/Surgical History: Past Medical History:  Diagnosis Date   Allergy    Anxiety    Aortic aneurysm (HCC)    Chronic back pain    Constipation due to opioid therapy    COVID    1st one was severe, no hospitalization, 2nd and 3rd were mild   DDD (degenerative disc disease), lumbar    Depression    Diabetes mellitus, type 2 (HCC)    GERD (gastroesophageal reflux disease)    past hx of  Gestational diabetes    no meds as of 06-01-2016.  diet controlled   Heart murmur    Heart murmur    Hyperlipidemia    Hypertension    pt this pain related - id occasionally high    IFG (impaired fasting glucose)    Migraines    no longer has migraines   Peripheral vascular disease (HCC)    Pneumonia     Hx in 1990s   Post-menopausal    Scoliosis    Stroke [facility])    Right Upper Extremity Deficit   Ulcer    years ago. gastric ulcer.    Past Surgical History:  Procedure Laterality Date   ABDOMINAL AORTIC ENDOVASCULAR STENT GRAFT N/A 05/12/2022   Procedure: ABDOMINAL AORTIC ENDOVASCULAR STENT GRAFT;  Surgeon: Chuck Hint, MD;  Location: [facility] OR;  Service: Vascular;  Laterality: N/A;   ABDOMINAL HYSTERECTOMY     has her ovaries, heavy periods   BACK SURGERY  2011   L5 S1   CHOLECYSTECTOMY N/A 12/05/2021   Procedure: LAPAROSCOPIC CHOLECYSTECTOMY;  Surgeon: Axel Filler, MD;  Location: [facility] OR;  Service: General;  Laterality: N/A;   COLONOSCOPY  2021   JOINT REPLACEMENT     L knee   knee surgeries     x 8 before replacement    POLYPECTOMY     HPP in 2008 DB - colon    Family History  Problem Relation Age of Onset   Stroke Mother    Heart failure Father        Triple bypass   Melanoma Father    Colon polyps Sister    Colon polyps Brother    Cancer Maternal Grandmother        unknown type of cancer   Cancer Maternal Grandfather        unknown type of cancer   Stomach cancer Paternal Grandfather    Esophageal cancer Neg Hx    Rectal cancer Neg Hx    Colon cancer Neg Hx    Pancreatic cancer Neg Hx    Breast cancer Neg Hx    Ovarian cancer Neg Hx    Endometrial cancer Neg Hx    Prostate cancer Neg Hx     Social History   Socioeconomic History   Marital status: Married    Spouse name: Not on file   Number of children: 2   Years of education: Not on file   Highest education level: Not on file  Occupational History   Occupation: disabled  Tobacco Use   Smoking status: Former    Current packs/day: 0.00    Average packs/day: 1 pack/day for 18.0 years (18.0 ttl pk-yrs)    Types: Cigarettes    Start date: 69    Quit date: 1991    Years since quitting: 33.6    Passive exposure: Current   Smokeless tobacco: Never   Tobacco comments:    Occasional  cigarette since quitting when younger  Vaping Use   Vaping status: Never Used  Substance and Sexual Activity   Alcohol use: Not Currently    Comment: Rare   Drug use: No   Sexual activity: Yes  Other Topics Concern   Not on file  Social History Narrative   Not on file   Social Determinants of Health   Financial Resource Strain: Not on file  Food Insecurity: Not on file  Transportation Needs: Not on file  Physical Activity: Not on file  Stress: Not on file  Social Connections: Unknown (  08/05/2021)   Received [facility]   Social Network    Social Network: Not on file    Current Medications:  Current Outpatient Medications:    senna-docusate (SENOKOT-S) 8.6-50 MG tablet, Take 2 tablets by mouth at bedtime. For AFTER surgery, do not take if having diarrhea, Disp: 30 tablet, Rfl: 0   traMADol (ULTRAM) 50 MG tablet, Take 1 tablet (50 mg total) by mouth every 6 (six) hours as needed for severe pain. For AFTER surgery only, do not take and drive, Disp: 10 tablet, Rfl: 0   acetaminophen (TYLENOL) 500 MG tablet, Take 1,000 mg by mouth every 6 (six) hours as needed for moderate pain., Disp: , Rfl:    Ascorbic Acid (VITAMIN C) 1000 MG tablet, Take 500 mg by mouth daily., Disp: , Rfl:    atorvastatin (LIPITOR) 80 MG tablet, Take 1 tablet (80 mg total) by mouth at bedtime., Disp: 30 tablet, Rfl: 1   b complex vitamins capsule, Take 1 capsule by mouth daily. Raw  B complex, Disp: , Rfl:    benzonatate (TESSALON) 100 MG capsule, Take 100 mg by mouth 3 (three) times daily as needed for cough., Disp: , Rfl:    cetirizine (ZYRTEC) 10 MG tablet, Take 10 mg by mouth daily., Disp: , Rfl:    CINNAMON PO, Take 2 capsules by mouth daily., Disp: , Rfl:    clopidogrel (PLAVIX) 75 MG tablet, Take 75 mg by mouth daily., Disp: , Rfl:    Coenzyme Q10 (CO Q-10) 100 MG CAPS, Take 100 mg by mouth daily., Disp: , Rfl:    diazepam (VALIUM) 5 MG tablet, Take 5 mg by mouth 3 (three) times  daily as needed for muscle spasms., Disp: , Rfl:    donepezil (ARICEPT) 5 MG tablet, Take 5 mg by mouth at bedtime., Disp: , Rfl:    DULoxetine (CYMBALTA) 60 MG capsule, Take 60 mg by mouth daily., Disp: , Rfl:    ezetimibe (ZETIA) 10 MG tablet, Take 10 mg by mouth daily., Disp: , Rfl: 3   gabapentin (NEURONTIN) 400 MG capsule, Take 400-800 mg by mouth See admin instructions. Take 400 mg in the morning and and afternoon and 800 mg at bedtidme, Disp: , Rfl:    loperamide (IMODIUM A-D) 2 MG tablet, Take 4 mg by mouth 2 (two) times daily as needed for diarrhea or loose stools., Disp: , Rfl:    losartan (COZAAR) 25 MG tablet, Take 50 mg by mouth every evening., Disp: , Rfl:    Magnesium 250 MG TABS, Take 250 mg by mouth at bedtime., Disp: , Rfl:    Omega-3 Fatty Acids (FISH OIL) 1200 MG CAPS, Take 2,400 mg by mouth 2 (two) times daily., Disp: , Rfl:    OVER THE COUNTER MEDICATION, Take 1 tablet by mouth at bedtime as needed (sleep). alteril natural sleep aid, Disp: , Rfl:    promethazine (PHENERGAN) 25 MG tablet, Take 25 mg by mouth 2 (two) times daily as needed for nausea or vomiting., Disp: , Rfl:    Taurine 1000 MG CAPS, Take 1,000 mg by mouth 3 (three) times daily., Disp: , Rfl:    TURMERIC PO, Take 1 tablet by mouth daily., Disp: , Rfl:    vitamin D3 (CHOLECALCIFEROL) 25 MCG tablet, Take 1,000 Units by mouth daily., Disp: , Rfl:    zolpidem (AMBIEN CR) 6.25 MG CR tablet, Take 6.25 mg by mouth at bedtime as needed for sleep., Disp: , Rfl: 5  Review of Symptoms:  Complete 10-system review is positive for: diarrhea, fatigue  Physical Exam: BP (!) 141/71 (BP Location: Left Arm, Patient Position: Sitting)   Pulse 82   Temp 99 F (37.2 C) (Oral)   Resp 17   Ht 5\' 5"  (1.651 m)   Wt 138 lb (62.6 kg)   SpO2 98%   BMI 22.96 kg/m  General: Alert, oriented, no acute distress. HEENT: Normocephalic, atraumatic. Neck symmetric without masses. Sclera anicteric.  Chest: Normal work of breathing. Clear  to auscultation bilaterally.   Cardiovascular: Regular rate and rhythm, no murmurs. Abdomen: Soft, nontender.   Extremities: Grossly normal range of motion.  Warm, well perfused.  No edema bilaterally.   Laboratory & Radiologic Studies: Surgical pathology (10/23/22): FINAL MICROSCOPIC DIAGNOSIS:   A. RIGHT PERINEUM, BIOPSY:  -  Squamous epithelium with acanthosis and parakeratosis consistent with  reactive changes, no HPV effect/dysplasia present.   Note: P16 is negative; p53 is wild-type and the proliferation rate by  Ki-67 shows a normal basal pattern.  Dr. Wenda Low has peer reviewed the  case and agrees with the interpretation.   B. LEFT PERINEUM, BIOPSY:  -  Squamous epithelium with mild koilocytic change/HPV effect consistent  with vulvar intraepithelial neoplasia 1 of 3.   Note: P16 is positive, but without a strong blocklike positivity  indicative of HGSIL.  P53 is wild-type.  The proliferation rate by Ki-67  shows a normal basilar proliferation.  Dr. Wenda Low has peer reviewed the  case and agrees with the interpretation.

## 2022-10-30 NOTE — Patient Instructions (Addendum)
Preparing for your Surgery  Plan for surgery on November 21, 2022 with Dr. Clide Cliff at [facility]. You will be scheduled for examination under anesthesia, simple partial vulvectomy.   We will reach out to your prescribing provider and let you know when to stop your plavix.  Pre-operative Testing -You will receive a phone call from presurgical testing at [facility] to discuss surgery instructions and arrange for lab work if needed.  -Bring your insurance card, copy of an advanced directive if applicable, medication list.  -Do not take supplements such as fish oil (omega 3), red yeast rice, turmeric before your surgery. You want to avoid medications with aspirin in them including headache powders such as BC or Goody's), Excedrin migraine.  Day Before Surgery at Home -You will be advised you can have clear liquids up until 3 hours before your surgery.    Your role in recovery Your role is to become active as soon as directed by your doctor, while still giving yourself time to heal.  Rest when you feel tired. You will be asked to do the following in order to speed your recovery:  - Cough and breathe deeply. This helps to clear and expand your lungs and can prevent pneumonia after surgery.  - STAY ACTIVE WHEN YOU GET HOME. Do mild physical activity. Walking or moving your legs help your circulation and body functions return to normal. Do not try to get up or walk alone the first time after surgery.   -If you develop swelling on one leg or the other, pain in the back of your leg, redness/warmth in one of your legs, please call the office or go to the Emergency Room to have a doppler to rule out a blood clot. For shortness of breath, chest pain-seek care in the Emergency Room as soon as possible. - Actively manage your pain. Managing your pain lets you move in comfort. We will ask you to rate your pain on a scale of zero to 10. It is your responsibility to tell  your doctor or nurse where and how much you hurt so your pain can be treated.  Special Considerations -Your final pathology results from surgery should be available around one week after surgery and the results will be relayed to you when available.  -FMLA forms can be faxed to [phone] and please allow 5-7 business days for completion.  Pain Management After Surgery -You will be prescribed your pain medication and bowel regimen medications before surgery so that you can have these available when you are discharged from the hospital. The pain medication is for use ONLY AFTER surgery and a new prescription will not be given.   -Make sure that you have Tylenol at home IF YOU ARE ABLE TO TAKE THESE MEDICATION to use on a regular basis after surgery for pain control.   -Review the attached handout on narcotic use and their risks and side effects.   Bowel Regimen -You will be prescribed Sennakot-S to take nightly to prevent constipation especially if you are taking the narcotic pain medication intermittently.  It is important to prevent constipation and drink adequate amounts of liquids. You can stop taking this medication when you are not taking pain medication and you are back on your normal bowel routine.  Risks of Surgery Risks of surgery are low but include bleeding, infection, damage to surrounding structures, re-operation, blood clots, and very rarely death.  AFTER SURGERY INSTRUCTIONS  Return to work:  2-4  weeks if applicable, based on occupation  We recommend purchasing several bags of frozen green peas and dividing them into ziploc bags. You will want to keep these in the freezer and have them ready to use as ice packs to the vulvar incision. Once the ice pack is no longer cold, you can get another from the freezer. The frozen peas mold to your body better than a regular ice pack.   Activity: 1. Be up and out of the bed during the day.  Take a nap if needed.  You may walk up steps  but be careful and use the hand rail.  Stair climbing will tire you more than you think, you may need to stop part way and rest.   2. No lifting or straining for 4 weeks over 10 pounds. No pushing, pulling, straining for 4 weeks.  3. No driving for minimum 24 hours after surgery but this is usually longer.  Do not drive if you are taking narcotic pain medicine and make sure that your reaction time has returned.   4. You can shower as soon as the next day after surgery. Shower daily. No tub baths or submerging your body in water until cleared by your surgeon. If you have the soap that was given to you by pre-surgical testing that was used before surgery, you do not need to use it afterwards because this can irritate your incisions.   5. No sexual activity and nothing in the vagina for 4 weeks.  6. You may experience vulvar spotting and discharge after surgery.  The spotting is normal but if you experience heavy bleeding, call our office.  7. Take Tylenol first for pain if you are able to take these medication and only use narcotic pain medication for severe pain not relieved by the Tylenol.  Monitor your Tylenol intake to a max of 4,000 mg in a 24 hour period.  Diet: 1. Low sodium Heart Healthy Diet is recommended but you are cleared to resume your normal (before surgery) diet after your procedure.  2. It is safe to use a laxative, such as Miralax or Colace, if you have difficulty moving your bowels. You have been prescribed Sennakot at bedtime every evening to keep bowel movements regular and to prevent constipation.    Wound Care: 1. Keep clean and dry.  Shower daily.  Reasons to call the Doctor: Fever - Oral temperature greater than 100.4 degrees Fahrenheit Foul-smelling vaginal discharge Difficulty urinating Nausea and vomiting Increased pain at the site of the incision that is unrelieved with pain medicine. Difficulty breathing with or without chest pain New calf pain especially if  only on one side Sudden, continuing increased vaginal bleeding with or without clots.   Contacts: For questions or concerns you should contact:  Dr. Clide Cliff at [phone]  Warner Mccreedy, NP at [phone]  After Hours: call [phone] and have the GYN Oncologist paged/contacted (after 5 pm or on the weekends).  Messages sent via mychart are for non-urgent matters and are not responded to after hours so for urgent needs, please call the after hours number.

## 2022-10-30 NOTE — H&P (View-Only) (Signed)
 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  We reviewed her exam findings from last visit. Reviewed her biopsy results.  Given that these additional biopsies only show reactive changes and VIN1 at most, reviewed that her treatment does not have to be very extensive. Can focus on the area of the original biopsy that showed VIN2. Feel that this area is quite small and the more broad changes are more consistent with low grade changes which seems to be consistent with what we found on biopsy.  Given this, recommend proceeding with surgical management of VIN2. Patient is in agreement with this. Reviewed that excision provides both treatment and a diagnostic specimen for those women with high-grade dysplasia.   Patient was consented for: simple partial vulvectomy on 11/21/22.  The risks of surgery were discussed in detail and she understands these to including but not limited to bleeding requiring a blood transfusion, infection, injury to adjacent organs (including but not limited to the rectum, vagina, nearby vessels/nerves), unforseen complication, and possible need for re-exploration.  If the patient experiences any of these events, she understands that her hospitalization or recovery may be prolonged and that she may need to take additional medications for a prolonged period. The patient will receive DVT and antibiotic prophylaxis as indicated. She voiced a clear understanding. She had the opportunity to ask questions and informed consent was obtained today. She wishes to proceed.  Patient may need to increase immodium postop to help with keeping surgical site clean. Pt has had diarrhea since her gallbladder surgery. Reviewed that she should discuss this with her PCP. May benefit from cholestyramine but will defer to PCP.  Patient on plavix. Will check with cardiology team  for perioperative recs.   She does not require preoperative clearance. Her METs are >4.  All preoperative instructions were reviewed. Postoperative expectations were also reviewed. Written handouts were provided to the patient.  RTC postop.  Clide Cliff, MD Gynecologic Oncology   Medical Decision Making I personally spent  TOTAL 30 minutes face-to-face and non-face-to-face in the care of this patient, which includes all pre, intra, and post visit time on the date of service.    ----------------------- Reason for Visit: Follow-up, treatment discussion  Treatment History: Patient presented to her OB/GYN on 12/01/2021 for occasional vulvar itching around the labia.  She had previously reported a history of VIN status post excision in 1996.  She returned on 08/08/2022 vulvar biopsy.  Biopsy was obtained of the right labia majora which returned with VIN 2.  Plan was for patient to undergo a wide local excision on 10/18/2022; however, decision was made instead to refer to GYN oncology.   Interval History: Patient reports that she has some soreness following her vulvar biopsies but no significant pain. She has some discharge. But otherwise no new concerns today since last visit.    Past Medical/Surgical History: Past Medical History:  Diagnosis Date   Allergy    Anxiety    Aortic aneurysm (HCC)    Chronic back pain    Constipation due to opioid therapy    COVID    1st one was severe, no hospitalization, 2nd and 3rd were mild   DDD (degenerative disc disease), lumbar    Depression    Diabetes mellitus, type 2 (HCC)    GERD (gastroesophageal reflux disease)    past hx of  Gestational diabetes    no meds as of 06-01-2016.  diet controlled   Heart murmur    Heart murmur    Hyperlipidemia    Hypertension    pt this pain related - id occasionally high    IFG (impaired fasting glucose)    Migraines    no longer has migraines   Peripheral vascular disease (HCC)    Pneumonia     Hx in 1990s   Post-menopausal    Scoliosis    Stroke [facility])    Right Upper Extremity Deficit   Ulcer    years ago. gastric ulcer.    Past Surgical History:  Procedure Laterality Date   ABDOMINAL AORTIC ENDOVASCULAR STENT GRAFT N/A 05/12/2022   Procedure: ABDOMINAL AORTIC ENDOVASCULAR STENT GRAFT;  Surgeon: Chuck Hint, MD;  Location: [facility] OR;  Service: Vascular;  Laterality: N/A;   ABDOMINAL HYSTERECTOMY     has her ovaries, heavy periods   BACK SURGERY  2011   L5 S1   CHOLECYSTECTOMY N/A 12/05/2021   Procedure: LAPAROSCOPIC CHOLECYSTECTOMY;  Surgeon: Axel Filler, MD;  Location: [facility] OR;  Service: General;  Laterality: N/A;   COLONOSCOPY  2021   JOINT REPLACEMENT     L knee   knee surgeries     x 8 before replacement    POLYPECTOMY     HPP in 2008 DB - colon    Family History  Problem Relation Age of Onset   Stroke Mother    Heart failure Father        Triple bypass   Melanoma Father    Colon polyps Sister    Colon polyps Brother    Cancer Maternal Grandmother        unknown type of cancer   Cancer Maternal Grandfather        unknown type of cancer   Stomach cancer Paternal Grandfather    Esophageal cancer Neg Hx    Rectal cancer Neg Hx    Colon cancer Neg Hx    Pancreatic cancer Neg Hx    Breast cancer Neg Hx    Ovarian cancer Neg Hx    Endometrial cancer Neg Hx    Prostate cancer Neg Hx     Social History   Socioeconomic History   Marital status: Married    Spouse name: Not on file   Number of children: 2   Years of education: Not on file   Highest education level: Not on file  Occupational History   Occupation: disabled  Tobacco Use   Smoking status: Former    Current packs/day: 0.00    Average packs/day: 1 pack/day for 18.0 years (18.0 ttl pk-yrs)    Types: Cigarettes    Start date: 69    Quit date: 1991    Years since quitting: 33.6    Passive exposure: Current   Smokeless tobacco: Never   Tobacco comments:    Occasional  cigarette since quitting when younger  Vaping Use   Vaping status: Never Used  Substance and Sexual Activity   Alcohol use: Not Currently    Comment: Rare   Drug use: No   Sexual activity: Yes  Other Topics Concern   Not on file  Social History Narrative   Not on file   Social Determinants of Health   Financial Resource Strain: Not on file  Food Insecurity: Not on file  Transportation Needs: Not on file  Physical Activity: Not on file  Stress: Not on file  Social Connections: Unknown (  08/05/2021)   Received [facility]   Social Network    Social Network: Not on file    Current Medications:  Current Outpatient Medications:    senna-docusate (SENOKOT-S) 8.6-50 MG tablet, Take 2 tablets by mouth at bedtime. For AFTER surgery, do not take if having diarrhea, Disp: 30 tablet, Rfl: 0   traMADol (ULTRAM) 50 MG tablet, Take 1 tablet (50 mg total) by mouth every 6 (six) hours as needed for severe pain. For AFTER surgery only, do not take and drive, Disp: 10 tablet, Rfl: 0   acetaminophen (TYLENOL) 500 MG tablet, Take 1,000 mg by mouth every 6 (six) hours as needed for moderate pain., Disp: , Rfl:    Ascorbic Acid (VITAMIN C) 1000 MG tablet, Take 500 mg by mouth daily., Disp: , Rfl:    atorvastatin (LIPITOR) 80 MG tablet, Take 1 tablet (80 mg total) by mouth at bedtime., Disp: 30 tablet, Rfl: 1   b complex vitamins capsule, Take 1 capsule by mouth daily. Raw  B complex, Disp: , Rfl:    benzonatate (TESSALON) 100 MG capsule, Take 100 mg by mouth 3 (three) times daily as needed for cough., Disp: , Rfl:    cetirizine (ZYRTEC) 10 MG tablet, Take 10 mg by mouth daily., Disp: , Rfl:    CINNAMON PO, Take 2 capsules by mouth daily., Disp: , Rfl:    clopidogrel (PLAVIX) 75 MG tablet, Take 75 mg by mouth daily., Disp: , Rfl:    Coenzyme Q10 (CO Q-10) 100 MG CAPS, Take 100 mg by mouth daily., Disp: , Rfl:    diazepam (VALIUM) 5 MG tablet, Take 5 mg by mouth 3 (three) times  daily as needed for muscle spasms., Disp: , Rfl:    donepezil (ARICEPT) 5 MG tablet, Take 5 mg by mouth at bedtime., Disp: , Rfl:    DULoxetine (CYMBALTA) 60 MG capsule, Take 60 mg by mouth daily., Disp: , Rfl:    ezetimibe (ZETIA) 10 MG tablet, Take 10 mg by mouth daily., Disp: , Rfl: 3   gabapentin (NEURONTIN) 400 MG capsule, Take 400-800 mg by mouth See admin instructions. Take 400 mg in the morning and and afternoon and 800 mg at bedtidme, Disp: , Rfl:    loperamide (IMODIUM A-D) 2 MG tablet, Take 4 mg by mouth 2 (two) times daily as needed for diarrhea or loose stools., Disp: , Rfl:    losartan (COZAAR) 25 MG tablet, Take 50 mg by mouth every evening., Disp: , Rfl:    Magnesium 250 MG TABS, Take 250 mg by mouth at bedtime., Disp: , Rfl:    Omega-3 Fatty Acids (FISH OIL) 1200 MG CAPS, Take 2,400 mg by mouth 2 (two) times daily., Disp: , Rfl:    OVER THE COUNTER MEDICATION, Take 1 tablet by mouth at bedtime as needed (sleep). alteril natural sleep aid, Disp: , Rfl:    promethazine (PHENERGAN) 25 MG tablet, Take 25 mg by mouth 2 (two) times daily as needed for nausea or vomiting., Disp: , Rfl:    Taurine 1000 MG CAPS, Take 1,000 mg by mouth 3 (three) times daily., Disp: , Rfl:    TURMERIC PO, Take 1 tablet by mouth daily., Disp: , Rfl:    vitamin D3 (CHOLECALCIFEROL) 25 MCG tablet, Take 1,000 Units by mouth daily., Disp: , Rfl:    zolpidem (AMBIEN CR) 6.25 MG CR tablet, Take 6.25 mg by mouth at bedtime as needed for sleep., Disp: , Rfl: 5  Review of Symptoms:  Complete 10-system review is positive for: diarrhea, fatigue  Physical Exam: BP (!) 141/71 (BP Location: Left Arm, Patient Position: Sitting)   Pulse 82   Temp 99 F (37.2 C) (Oral)   Resp 17   Ht 5\' 5"  (1.651 m)   Wt 138 lb (62.6 kg)   SpO2 98%   BMI 22.96 kg/m  General: Alert, oriented, no acute distress. HEENT: Normocephalic, atraumatic. Neck symmetric without masses. Sclera anicteric.  Chest: Normal work of breathing. Clear  to auscultation bilaterally.   Cardiovascular: Regular rate and rhythm, no murmurs. Abdomen: Soft, nontender.   Extremities: Grossly normal range of motion.  Warm, well perfused.  No edema bilaterally.   Laboratory & Radiologic Studies: Surgical pathology (10/23/22): FINAL MICROSCOPIC DIAGNOSIS:   A. RIGHT PERINEUM, BIOPSY:  -  Squamous epithelium with acanthosis and parakeratosis consistent with  reactive changes, no HPV effect/dysplasia present.   Note: P16 is negative; p53 is wild-type and the proliferation rate by  Ki-67 shows a normal basal pattern.  Dr. Wenda Low has peer reviewed the  case and agrees with the interpretation.   B. LEFT PERINEUM, BIOPSY:  -  Squamous epithelium with mild koilocytic change/HPV effect consistent  with vulvar intraepithelial neoplasia 1 of 3.   Note: P16 is positive, but without a strong blocklike positivity  indicative of HGSIL.  P53 is wild-type.  The proliferation rate by Ki-67  shows a normal basilar proliferation.  Dr. Wenda Low has peer reviewed the  case and agrees with the interpretation.

## 2022-10-30 NOTE — Progress Notes (Signed)
Patient here for a pre-operative appointment prior to her scheduled surgery on November 21, 2022. She is scheduled for a examination under anesthesia, simple partial vulvectomy. The surgery was discussed in detail.  See after visit summary for additional details.   Discussed post-op pain management in detail including the aspects of the enhanced recovery pathway.  Advised her that a new prescription would be sent in for Tramadol and it is only to be used for after her upcoming surgery.  We discussed the use of tylenol post-op and to monitor for a maximum of 4,000 mg in a 24 hour period.  Also prescribed sennakot to be used after surgery and to hold if having loose stools.  Discussed bowel regimen in detail.     Discussed the use of SCDs and measures to take at home to prevent DVT including frequent mobility.  Reportable signs and symptoms of DVT discussed. Post-operative instructions discussed and expectations for after surgery. Incisional care discussed as well including reportable signs and symptoms including erythema, drainage, wound separation.     30 minutes spent with the patient.  Verbalizing understanding of material discussed. No needs or concerns voiced at the end of the visit.   Advised patient to call for any needs.  Advised that her post-operative medications had been prescribed and could be picked up at any time.    This appointment is included in the global surgical bundle as pre-operative teaching and has no charge.

## 2022-11-01 ENCOUNTER — Telehealth: Payer: Self-pay

## 2022-11-01 NOTE — Telephone Encounter (Signed)
Faxed over surgery optimization form to Dr Jacinto Halim which is patient's cardiologist.  Faxed to [phone].Marland Kitchen

## 2022-11-07 ENCOUNTER — Telehealth: Payer: Self-pay

## 2022-11-07 NOTE — Telephone Encounter (Signed)
Spoke with Sunny Schlein at Dr Jacinto Halim office.  She stated that she had received the form and she would send it to the nurse and it would either be faxed back or be sent to [facility].

## 2022-11-09 ENCOUNTER — Encounter: Payer: Self-pay | Admitting: Cardiology

## 2022-11-10 NOTE — Progress Notes (Signed)
Requested plavix clearance from dr newton office (from dr Cristal Deer dixon who manages plavix)

## 2022-11-14 ENCOUNTER — Encounter (HOSPITAL_BASED_OUTPATIENT_CLINIC_OR_DEPARTMENT_OTHER): Payer: Self-pay | Admitting: Psychiatry

## 2022-11-14 ENCOUNTER — Telehealth: Payer: Self-pay | Admitting: *Deleted

## 2022-11-14 NOTE — Telephone Encounter (Signed)
Plavix clearance letter from [facility]  faxed to Cain Sieve with pre-admission testing at [phone].  Per Dr. Yates Decamp can hold Plavix for 7 days prior to procedure and re-start 1-2 days post procedure.

## 2022-11-15 ENCOUNTER — Other Ambulatory Visit: Payer: Self-pay

## 2022-11-15 ENCOUNTER — Encounter (HOSPITAL_BASED_OUTPATIENT_CLINIC_OR_DEPARTMENT_OTHER): Payer: Self-pay | Admitting: Psychiatry

## 2022-11-15 ENCOUNTER — Telehealth: Payer: Self-pay | Admitting: *Deleted

## 2022-11-15 NOTE — Progress Notes (Addendum)
Spoke w/ via phone for pre-op interview---pt Lab needs dos---- I stat              Lab results------lov vascular dr Waverly Ferrari 08-03-2022, EKG 04-19-2022 epic, echo 11-08-2020 epic COVID test -----patient states asymptomatic no test needed Arrive at -------530 am 11-21-2022 NPO after MN NO Solid Food.  Clear liquids from MN until---430 Med rec completed Medications to take morning of surgery -----certrizine, diazepam prn, duloxetine, ezetimibe, gabapentin Diabetic medication -----n/a Patient instructed no nail polish to be worn day of surgery Patient instructed to bring photo id and insurance card day of surgery Patient aware to have Driver (ride ) / caregiver    for 24 hours after surgery  husband robert Patient Special Instructions -----none Pre-Op special Instructions -----none Patient verbalized understanding of instructions that were given at this phone interview. Patient denies shortness of breath, chest pain, fever, cough at this phone interview.  Cardiac clearance/note to stop plavix 7 days before surgery dr Jacinto Halim dated 11-09-2022 Last dose of plavix was 11-15-2022 per pt, dr Alvester Morin aware last dose of plavix was 11-15-2022 and  dr  Alvester Morin is ok with plavix stopping 11-15-2022 for 11-21-2022 surgery per jamie nadeau rn

## 2022-11-15 NOTE — Telephone Encounter (Signed)
Spoke with Deborah Peters from pre-admission testing stating that patient took her last dose of Plavix today, August 21 st. The recommendation was to hold Plavix for 7 days prior to surgery on August 27 th. Patient was suppose to stop taking her Plavix on Monday the 19 th with the day of surgery not counting in the 7 days.  Message will be relayed to provider for advice.

## 2022-11-20 ENCOUNTER — Telehealth: Payer: Self-pay

## 2022-11-20 NOTE — Discharge Instructions (Signed)
AFTER SURGERY INSTRUCTIONS   Return to work:  2-4 weeks if applicable, based on occupation   We recommend purchasing several bags of frozen green peas and dividing them into ziploc bags. You will want to keep these in the freezer and have them ready to use as ice packs to the vulvar incision. Once the ice pack is no longer cold, you can get another from the freezer. The frozen peas mold to your body better than a regular ice pack.   OKAY to resume plavix tomorrow (11/22/22).   Activity: 1. Be up and out of the bed during the day.  Take a nap if needed.  You may walk up steps but be careful and use the hand rail.  Stair climbing will tire you more than you think, you may need to stop part way and rest.    2. No lifting or straining for 4 weeks over 10 pounds. No pushing, pulling, straining for 4 weeks.   3. No driving for minimum 24 hours after surgery but this is usually longer.  Do not drive if you are taking narcotic pain medicine and make sure that your reaction time has returned.    4. You can shower as soon as the next day after surgery. Shower daily. No tub baths or submerging your body in water until cleared by your surgeon. If you have the soap that was given to you by pre-surgical testing that was used before surgery, you do not need to use it afterwards because this can irritate your incisions.    5. No sexual activity and nothing in the vagina for 4 weeks.   6. You may experience vulvar spotting and discharge after surgery.  The spotting is normal but if you experience heavy bleeding, call our office.   7. Take Tylenol first for pain if you are able to take these medication and only use narcotic pain medication for severe pain not relieved by the Tylenol.  Monitor your Tylenol intake to a max of 4,000 mg in a 24 hour period.   Diet: 1. Low sodium Heart Healthy Diet is recommended but you are cleared to resume your normal (before surgery) diet after your procedure.   2. It is safe  to use a laxative, such as Miralax or Colace, if you have difficulty moving your bowels. You have been prescribed Sennakot at bedtime every evening to keep bowel movements regular and to prevent constipation.     Wound Care: 1. Keep clean and dry.  Shower daily.   Reasons to call the Doctor: Fever - Oral temperature greater than 100.4 degrees Fahrenheit Foul-smelling vaginal discharge Difficulty urinating Nausea and vomiting Increased pain at the site of the incision that is unrelieved with pain medicine. Difficulty breathing with or without chest pain New calf pain especially if only on one side Sudden, continuing increased vaginal bleeding with or without clots.   Contacts: For questions or concerns you should contact:   Dr. Clide Cliff at [phone]   Warner Mccreedy, NP at [phone]   After Hours: call [phone] and have the GYN Oncologist paged/contacted (after 5 pm or on the weekends).   Messages sent via mychart are for non-urgent matters and are not responded to after hours so for urgent needs, please call the after hours number. Post Anesthesia Home Care Instructions  Activity: Get plenty of rest for the remainder of the day. A responsible adult should stay with you for 24 hours following the procedure.  For the next 24 hours,  DO NOT: -Drive a car -Advertising copywriter -Drink alcoholic beverages -Take any medication unless instructed by your physician -Make any legal decisions or sign important papers.  Meals: Start with liquid foods such as gelatin or soup. Progress to regular foods as tolerated. Avoid greasy, spicy, heavy foods. If nausea and/or vomiting occur, drink only clear liquids until the nausea and/or vomiting subsides. Call your physician if vomiting continues.  Special Instructions/Symptoms: Your throat may feel dry or sore from the anesthesia or the breathing tube placed in your throat during surgery. If this causes discomfort, gargle with warm  salt water. The discomfort should disappear within 24 hours.

## 2022-11-20 NOTE — Telephone Encounter (Signed)
Telephone call to check on pre-operative status.  Patient compliant with pre-operative instructions.  Reinforced nothing to eat after midnight. Clear liquids until 0430. Patient to arrive at 0530.  No questions or concerns voiced.  Instructed to call for any needs. Pt states she stopped her Plavix, last dose 11/15/22

## 2022-11-21 ENCOUNTER — Ambulatory Visit (HOSPITAL_BASED_OUTPATIENT_CLINIC_OR_DEPARTMENT_OTHER): Payer: PPO | Admitting: Anesthesiology

## 2022-11-21 ENCOUNTER — Encounter (HOSPITAL_BASED_OUTPATIENT_CLINIC_OR_DEPARTMENT_OTHER): Payer: Self-pay | Admitting: Psychiatry

## 2022-11-21 ENCOUNTER — Other Ambulatory Visit: Payer: Self-pay

## 2022-11-21 ENCOUNTER — Encounter (HOSPITAL_BASED_OUTPATIENT_CLINIC_OR_DEPARTMENT_OTHER)
Admission: RE | Disposition: A | Discharge status: Home / Self Care | Payer: Self-pay | Source: Home / Self Care | Attending: Psychiatry

## 2022-11-21 ENCOUNTER — Ambulatory Visit (HOSPITAL_BASED_OUTPATIENT_CLINIC_OR_DEPARTMENT_OTHER)
Admission: RE | Admit: 2022-11-21 | Discharge: 2022-11-21 | Disposition: A | Discharge status: Home / Self Care | Payer: PPO | Attending: Psychiatry | Admitting: Psychiatry

## 2022-11-21 DIAGNOSIS — I719 Aortic aneurysm of unspecified site, without rupture: Secondary | ICD-10-CM | POA: Insufficient documentation

## 2022-11-21 DIAGNOSIS — M1990 Unspecified osteoarthritis, unspecified site: Secondary | ICD-10-CM | POA: Diagnosis not present

## 2022-11-21 DIAGNOSIS — F32A Depression, unspecified: Secondary | ICD-10-CM | POA: Diagnosis not present

## 2022-11-21 DIAGNOSIS — E1151 Type 2 diabetes mellitus with diabetic peripheral angiopathy without gangrene: Secondary | ICD-10-CM | POA: Insufficient documentation

## 2022-11-21 DIAGNOSIS — I2510 Atherosclerotic heart disease of native coronary artery without angina pectoris: Secondary | ICD-10-CM | POA: Diagnosis not present

## 2022-11-21 DIAGNOSIS — F419 Anxiety disorder, unspecified: Secondary | ICD-10-CM | POA: Diagnosis not present

## 2022-11-21 DIAGNOSIS — I10 Essential (primary) hypertension: Secondary | ICD-10-CM

## 2022-11-21 DIAGNOSIS — Z9049 Acquired absence of other specified parts of digestive tract: Secondary | ICD-10-CM | POA: Diagnosis not present

## 2022-11-21 DIAGNOSIS — Z87891 Personal history of nicotine dependence: Secondary | ICD-10-CM | POA: Insufficient documentation

## 2022-11-21 DIAGNOSIS — Z8673 Personal history of transient ischemic attack (TIA), and cerebral infarction without residual deficits: Secondary | ICD-10-CM | POA: Diagnosis not present

## 2022-11-21 DIAGNOSIS — N903 Dysplasia of vulva, unspecified: Secondary | ICD-10-CM

## 2022-11-21 DIAGNOSIS — Z7902 Long term (current) use of antithrombotics/antiplatelets: Secondary | ICD-10-CM | POA: Diagnosis not present

## 2022-11-21 DIAGNOSIS — F0390 Unspecified dementia without behavioral disturbance: Secondary | ICD-10-CM | POA: Insufficient documentation

## 2022-11-21 DIAGNOSIS — N901 Moderate vulvar dysplasia: Secondary | ICD-10-CM | POA: Insufficient documentation

## 2022-11-21 DIAGNOSIS — Z01818 Encounter for other preprocedural examination: Secondary | ICD-10-CM

## 2022-11-21 HISTORY — DX: Prediabetes: R73.03

## 2022-11-21 HISTORY — DX: Other specified postprocedural states: Z98.890

## 2022-11-21 HISTORY — PX: VULVECTOMY PARTIAL: SHX6187

## 2022-11-21 HISTORY — DX: Inflammatory liver disease, unspecified: K75.9

## 2022-11-21 LAB — POCT I-STAT, CHEM 8
BUN: 5 mg/dL — ABNORMAL LOW (ref 8–23)
Calcium, Ion: 1.23 mmol/L (ref 1.15–1.40)
Chloride: 104 mmol/L (ref 98–111)
Creatinine, Ser: 0.7 mg/dL (ref 0.44–1.00)
Glucose, Bld: 103 mg/dL — ABNORMAL HIGH (ref 70–99)
HCT: 39 % (ref 36.0–46.0)
Hemoglobin: 13.3 g/dL (ref 12.0–15.0)
Potassium: 3.5 mmol/L (ref 3.5–5.1)
Sodium: 140 mmol/L (ref 135–145)
TCO2: 21 mmol/L — ABNORMAL LOW (ref 22–32)

## 2022-11-21 SURGERY — EXAM UNDER ANESTHESIA
Anesthesia: Monitor Anesthesia Care | Site: Vulva

## 2022-11-21 MED ORDER — PROPOFOL 10 MG/ML IV BOLUS
INTRAVENOUS | Status: DC | PRN
Start: 2022-11-21 — End: 2022-11-21
  Administered 2022-11-21 (×4): 20 mg via INTRAVENOUS

## 2022-11-21 MED ORDER — ACETIC ACID 5 % SOLN
Status: DC | PRN
  Administered 2022-11-21: 1 via TOPICAL

## 2022-11-21 MED ORDER — PROPOFOL 500 MG/50ML IV EMUL
INTRAVENOUS | Status: AC
  Filled 2022-11-21: qty 50

## 2022-11-21 MED ORDER — PROPOFOL 10 MG/ML IV BOLUS
INTRAVENOUS | Status: AC
  Filled 2022-11-21: qty 20

## 2022-11-21 MED ORDER — ACETAMINOPHEN 500 MG PO TABS
1000.0000 mg | ORAL_TABLET | ORAL | Status: AC
  Administered 2022-11-21: 1000 mg via ORAL

## 2022-11-21 MED ORDER — DEXMEDETOMIDINE HCL IN NACL 80 MCG/20ML IV SOLN
INTRAVENOUS | Status: DC | PRN
  Administered 2022-11-21: 8 ug via INTRAVENOUS

## 2022-11-21 MED ORDER — FENTANYL CITRATE (PF) 100 MCG/2ML IJ SOLN
INTRAMUSCULAR | Status: AC
  Filled 2022-11-21: qty 2

## 2022-11-21 MED ORDER — BUPIVACAINE HCL 0.25 % IJ SOLN
INTRAMUSCULAR | Status: DC | PRN
  Administered 2022-11-21: 16 mL

## 2022-11-21 MED ORDER — PHENYLEPHRINE 80 MCG/ML (10ML) SYRINGE FOR IV PUSH (FOR BLOOD PRESSURE SUPPORT)
PREFILLED_SYRINGE | INTRAVENOUS | Status: DC | PRN
  Administered 2022-11-21: 160 ug via INTRAVENOUS

## 2022-11-21 MED ORDER — MIDAZOLAM HCL 2 MG/2ML IJ SOLN
INTRAMUSCULAR | Status: AC
  Filled 2022-11-21: qty 2

## 2022-11-21 MED ORDER — FENTANYL CITRATE (PF) 250 MCG/5ML IJ SOLN
INTRAMUSCULAR | Status: DC | PRN
  Administered 2022-11-21 (×4): 25 ug via INTRAVENOUS

## 2022-11-21 MED ORDER — ONDANSETRON HCL 4 MG/2ML IJ SOLN
INTRAMUSCULAR | Status: DC | PRN
  Administered 2022-11-21: 4 mg via INTRAVENOUS

## 2022-11-21 MED ORDER — 0.9 % SODIUM CHLORIDE (POUR BTL) OPTIME
TOPICAL | Status: DC | PRN
  Administered 2022-11-21: 500 mL

## 2022-11-21 MED ORDER — LACTATED RINGERS IV SOLN: INTRAVENOUS | Status: DC

## 2022-11-21 MED ORDER — KETOROLAC TROMETHAMINE 30 MG/ML IJ SOLN
INTRAMUSCULAR | Status: DC | PRN
Start: 2022-11-21 — End: 2022-11-21
  Administered 2022-11-21: 30 mg via INTRAVENOUS

## 2022-11-21 MED ORDER — MIDAZOLAM HCL 2 MG/2ML IJ SOLN
INTRAMUSCULAR | Status: DC | PRN
Start: 2022-11-21 — End: 2022-11-21
  Administered 2022-11-21 (×2): 1 mg via INTRAVENOUS

## 2022-11-21 MED ORDER — PROPOFOL 500 MG/50ML IV EMUL
INTRAVENOUS | Status: DC | PRN
  Administered 2022-11-21: 180 ug/kg/min via INTRAVENOUS

## 2022-11-21 MED ORDER — ACETAMINOPHEN 500 MG PO TABS
ORAL_TABLET | ORAL | Status: AC
  Filled 2022-11-21: qty 2

## 2022-11-21 MED ORDER — LIDOCAINE 2% (20 MG/ML) 5 ML SYRINGE
INTRAMUSCULAR | Status: DC | PRN
  Administered 2022-11-21: 40 mg via INTRAVENOUS

## 2022-11-21 MED ORDER — DEXAMETHASONE SODIUM PHOSPHATE 10 MG/ML IJ SOLN: 4.0000 mg | INTRAMUSCULAR | Status: DC

## 2022-11-21 SURGICAL SUPPLY — 28 items
BLADE CLIPPER SENSICLIP SURGIC (BLADE) IMPLANT
BLADE SURG 15 STRL LF DISP TIS (BLADE) ×2 IMPLANT
BLADE SURG 15 STRL SS (BLADE) ×2
GLOVE BIO SURGEON STRL SZ 6 (GLOVE) ×4 IMPLANT
GLOVE BIO SURGEON STRL SZ8 (GLOVE) IMPLANT
GLOVE BIOGEL PI IND STRL 6.5 (GLOVE) ×2 IMPLANT
GLOVE BIOGEL PI IND STRL 7.0 (GLOVE) IMPLANT
GLOVE BIOGEL PI IND STRL 8 (GLOVE) IMPLANT
GLOVE SURG SYN 6.5 ES PF (GLOVE) ×4
GLOVE SURG SYN 6.5 PF PI (GLOVE) IMPLANT
GOWN STRL REUS W/ TWL LRG LVL3 (GOWN DISPOSABLE) IMPLANT
GOWN STRL REUS W/TWL LRG LVL3 (GOWN DISPOSABLE) ×6 IMPLANT
GOWN STRL REUS W/TWL XL LVL3 (GOWN DISPOSABLE) IMPLANT
HIBICLENS CHG 4% 4OZ (MISCELLANEOUS) IMPLANT
KIT TURNOVER CYSTO (KITS) ×2 IMPLANT
NS IRRIG 500ML POUR BTL (IV SOLUTION) IMPLANT
PACK PERINEAL COLD (PAD) ×2 IMPLANT
PACK VAGINAL WOMENS (CUSTOM PROCEDURE TRAY) ×2 IMPLANT
SLEEVE SCD COMPRESS KNEE MED (STOCKING) ×2 IMPLANT
SPIKE FLUID TRANSFER (MISCELLANEOUS) IMPLANT
SUT VIC AB 0 SH 27 (SUTURE) ×2 IMPLANT
SUT VIC AB 2-0 SH 27 (SUTURE) ×2
SUT VIC AB 2-0 SH 27XBRD (SUTURE) IMPLANT
SUT VIC AB 3-0 SH 27 (SUTURE) ×4
SUT VIC AB 3-0 SH 27X BRD (SUTURE) ×2 IMPLANT
SUT VIC AB 4-0 PS2 18 (SUTURE) ×2 IMPLANT
SYR BULB IRRIG 60ML STRL (SYRINGE) ×2 IMPLANT
TOWEL OR 17X24 6PK STRL BLUE (TOWEL DISPOSABLE) ×2 IMPLANT

## 2022-11-21 NOTE — Op Note (Signed)
GYNECOLOGIC ONCOLOGY OPERATIVE NOTE  Date of Service: 11/21/2022  Preoperative Diagnosis: VIN2  Postoperative Diagnosis: Same  Procedures: Simple partial vulvectomy  Surgeon: Clide Cliff, MD  Assistants: None  Anesthesia: MAC  Estimated Blood Loss: 5 mL   Fluids: 500 ml, crystalloid  Urine Output: Voided prior to OR  Findings: On vulvar exam, well healed prior biopsy sites. Light whitening of tissue of right anterior perineum as it approaches the posterior fourchette. With acetic acid applied, light acetowhite changes of the anterior perineum, with slight increase in density of right anterior perineum (area that was resected and previously biopsied with VIN2). Remainder of perineum with mild changes more consistent with mild dysplasia as demonstrated by prior biopsy. 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  Prior to the procedure, all risks, benefits, and alternatives were discussed and informed surgical consent was signed.  Procedure: Patient was taken to the operating room where general anesthesia was achieved.  She was positioned in dorsal lithotomy and prepped and draped.  Patient voided prior to OR.  A thorough exam of the vulva was done after placing a moist sponge with acetic acid on the vulva with the findings as noted above. The area to be removed was outlined with a marking pen. Local anesthesia was injected with 0.25% marcaine. A scalpel was used to incise around the lesion. Allis clamps are used to grasp the lesion and a skinning vulvectomy was performed in the standard fashion with aid of cautery. After the lesion  was removed, the wound bed was made hemostatic with cautery. Several deep 3-0 vicryl suture was placed to bring the wound edges together followed by 4-0 vicryl mattress stitches on the skin edges to re-appoximate the wound. The small satellite lesion was cauterized with bovie. Hemostasis was noted at the end of the procedure.   Patient tolerated the procedure well. Sponge, lap, and instrument counts were correct.  No perioperative antibiotics were indicated for this procedure.  Patient was extubated and taken to the PACU in stable condition.  Clide Cliff, MD Gynecologic Oncology

## 2022-11-21 NOTE — Anesthesia Postprocedure Evaluation (Signed)
Anesthesia Post Note  Patient: Deborah Peters  Procedure(s) Performed: PELVIC EXAMINATION UNDER ANESTHESIA (Vagina ) VULVECTOMY PARTIAL SIMPLE (Vulva)     Patient location during evaluation: PACU Anesthesia Type: MAC Level of consciousness: awake and alert Pain management: pain level controlled Vital Signs Assessment: post-procedure vital signs reviewed and stable Respiratory status: spontaneous breathing, nonlabored ventilation, respiratory function stable and patient connected to nasal cannula oxygen Cardiovascular status: stable and blood pressure returned to baseline Postop Assessment: no apparent nausea or vomiting Anesthetic complications: no   No notable events documented.  Last Vitals:  Vitals:   11/21/22 0858 11/21/22 0928  BP:  (!) 109/53  Pulse: 61 60  Resp: 17 18  Temp: 36.7 C   SpO2: 97% 99%    Last Pain:  Vitals:   11/21/22 0928  TempSrc:   PainSc: 0-No pain                 Ramonita Koenig S

## 2022-11-21 NOTE — Interval H&P Note (Signed)
History and Physical Interval Note:  11/21/2022 7:19 AM  Deborah Peters  has presented today for surgery, with the diagnosis of VULVAR DYSPLASIA.  The various methods of treatment have been discussed with the patient and family. After consideration of risks, benefits and other options for treatment, the patient has consented to  Procedure(s): PELVIC EXAMINATION UNDER ANESTHESIA (N/A) VULVECTOMY PARTIAL SIMPLE (N/A) as a surgical intervention.  The patient's history has been reviewed, patient examined, no change in status, stable for surgery.  I have reviewed the patient's chart and labs.  Questions were answered to the patient's satisfaction.     [name]

## 2022-11-21 NOTE — Transfer of Care (Signed)
Immediate Anesthesia Transfer of Care Note  Patient: JAHNICE PRINE  Procedure(s) Performed: PELVIC EXAMINATION UNDER ANESTHESIA (Vagina ) VULVECTOMY PARTIAL SIMPLE (Vulva)  Patient Location: PACU  Anesthesia Type:MAC  Level of Consciousness: sedated  Airway & Oxygen Therapy: Patient Spontanous Breathing and Patient connected to face mask oxygen  Post-op Assessment: Report given to RN and Post -op Vital signs reviewed and stable  Post vital signs: Reviewed and stable  Last Vitals:  Vitals Value Taken Time  BP 87/39 11/21/22 0830  Temp 37.6 C 11/21/22 0829  Pulse 53 11/21/22 0829  Resp 10 11/21/22 0829  SpO2 100 % 11/21/22 0829  Vitals shown include unfiled device data.  Last Pain:  Vitals:   11/21/22 0622  TempSrc: Oral  PainSc: 6       Patients Stated Pain Goal: 6 (11/21/22 0622)  Complications: No notable events documented.

## 2022-11-21 NOTE — Anesthesia Preprocedure Evaluation (Signed)
Anesthesia Evaluation  Patient identified by MRN, date of birth, ID band Patient awake    Reviewed: Allergy & Precautions, H&P , NPO status , Patient's Chart, lab work & pertinent test results  History of Anesthesia Complications (+) PONV and history of anesthetic complications  Airway Mallampati: II   Neck ROM: full    Dental   Pulmonary former smoker   breath sounds clear to auscultation       Cardiovascular hypertension, + Peripheral Vascular Disease   Rhythm:regular Rate:Normal  S/p stent for aortic aneurysm   Neuro/Psych  Headaches PSYCHIATRIC DISORDERS Anxiety Depression   Dementia  Neuromuscular disease CVA    GI/Hepatic ,GERD  ,,  Endo/Other    Renal/GU      Musculoskeletal  (+) Arthritis ,    Abdominal   Peds  Hematology   Anesthesia Other Findings   Reproductive/Obstetrics                             Anesthesia Physical Anesthesia Plan  ASA: 3  Anesthesia Plan: MAC   Post-op Pain Management:    Induction: Intravenous  PONV Risk Score and Plan: 3 and Propofol infusion, Treatment may vary due to age or medical condition and Ondansetron  Airway Management Planned: Simple Face Mask  Additional Equipment:   Intra-op Plan:   Post-operative Plan:   Informed Consent: I have reviewed the patients History and Physical, chart, labs and discussed the procedure including the risks, benefits and alternatives for the proposed anesthesia with the patient or authorized representative who has indicated his/her understanding and acceptance.     Dental advisory given  Plan Discussed with: CRNA, Anesthesiologist and Surgeon  Anesthesia Plan Comments:        Anesthesia Quick Evaluation

## 2022-11-21 NOTE — Discharge Instr - Supplementary Instructions (Signed)
May take Ibuprofen after 2:15pm if needed for discomfort.

## 2022-11-22 ENCOUNTER — Encounter (HOSPITAL_BASED_OUTPATIENT_CLINIC_OR_DEPARTMENT_OTHER): Payer: Self-pay | Admitting: Psychiatry

## 2022-11-22 ENCOUNTER — Telehealth: Payer: Self-pay | Admitting: *Deleted

## 2022-11-22 NOTE — Telephone Encounter (Signed)
Spoke with Ms. Pert this morning. She states she is eating, drinking and urinating well. She has not had a BM yet but is passing gas. She is not taking senokot, states her stools have been loose since having her gallbladder removed.  Encouraged her to drink plenty of water. She denies fever or chills.  She rates her pain 6/10. Her pain is controlled with tylenol and tramadol.    Instructed to call office with any fever, chills, purulent drainage, uncontrolled pain or any other questions or concerns. Patient verbalizes understanding.   Pt aware of post op appointments as well as the office number [phone] and after hours number [phone] to call if she has any questions or concerns  Patient also wanted to relay message to Dr. Alvester Morin and her entire care team at [facility]. She said her experience was wonderful and everyone was amazing and wanted to send a huge thank you to everyone.

## 2022-11-22 NOTE — Telephone Encounter (Signed)
Attempted to reach Ms. Mayer Camel for post op call. Left voicemail requesting call back.

## 2022-11-30 LAB — SURGICAL PATHOLOGY

## 2022-12-04 ENCOUNTER — Telehealth: Payer: Self-pay

## 2022-12-04 ENCOUNTER — Inpatient Hospital Stay: Payer: PPO | Admitting: Psychiatry

## 2022-12-04 DIAGNOSIS — N901 Moderate vulvar dysplasia: Secondary | ICD-10-CM

## 2022-12-04 NOTE — Telephone Encounter (Signed)
Patient called in and stated that she had a fever, really bad cold and wanted to reschedule her appointment for today.  Rescheduled her for 9/16 @ 3:45pm.. Patient confirmed and understood.

## 2022-12-05 NOTE — Progress Notes (Signed)
This encounter was created in error - please disregard.

## 2022-12-11 ENCOUNTER — Inpatient Hospital Stay: Payer: PPO | Attending: Psychiatry | Admitting: Psychiatry

## 2022-12-11 ENCOUNTER — Telehealth: Payer: Self-pay

## 2022-12-11 DIAGNOSIS — N901 Moderate vulvar dysplasia: Secondary | ICD-10-CM

## 2022-12-11 NOTE — Telephone Encounter (Signed)
Ms.Lesnick called this morning stating she has a terrible sinus infection and needs to reschedule her appointment today with [facility]. She going to get in to see her PCP today.  Rescheduled to 12/25/22 @ 1:15. Pt agree to date and time

## 2022-12-14 NOTE — Progress Notes (Signed)
This encounter was created in error - please disregard.

## 2022-12-25 ENCOUNTER — Inpatient Hospital Stay: Payer: PPO | Attending: Psychiatry | Admitting: Psychiatry

## 2022-12-25 ENCOUNTER — Encounter: Payer: Self-pay | Admitting: Psychiatry

## 2022-12-25 ENCOUNTER — Other Ambulatory Visit: Payer: Self-pay

## 2022-12-25 VITALS — BP 150/82 | HR 74 | Temp 98.7°F | Resp 16 | Ht 65.0 in | Wt 140.5 lb

## 2022-12-25 DIAGNOSIS — Z9079 Acquired absence of other genital organ(s): Secondary | ICD-10-CM | POA: Diagnosis not present

## 2022-12-25 DIAGNOSIS — Z8616 Personal history of COVID-19: Secondary | ICD-10-CM | POA: Diagnosis not present

## 2022-12-25 DIAGNOSIS — N901 Moderate vulvar dysplasia: Secondary | ICD-10-CM

## 2022-12-25 NOTE — Progress Notes (Signed)
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  Postop: - Pt recovering well from surgery and healing appropriately postoperatively - Intraoperative findings and pathology results reviewed. - Ongoing postoperative expectations and precautions reviewed.   VIN3: - Reviewed final pathology in detail. - No evidence of invasive cancer.  - Margins positive for dysplasia.  -On exam today, on edges of healing area, some whitening of tissue possibly consistent with ongoing dysplasia versus healing. - Reviewed signs/symptoms of recurrence.  - Surveillance reviewed. Follow-up q6 months x2 years then annually.  However, will have patient follow-up sooner initially to follow-up healing of wound and reevaluation of edges of wound.  Patient may need some additional treatment at this site, possibly with laser, to treat residual disease if needed based on follow-up exam.  RTC 2 months.  Clide Cliff, MD Gynecologic Oncology   Medical Decision Making I personally spent  TOTAL 23 minutes face-to-face and non-face-to-face in the care of this patient, which includes all pre, intra, and post visit time on the date of service. The discussion of diagnosis and management vulvar dysplasia is beyond the scope of routine postoperative care.  ----------------------- Reason for Visit: Postop/Treatment counseling  Treatment History: Patient presented to her OB/GYN on 12/01/2021 for occasional vulvar itching around the labia. She had previously reported a history of VIN status post excision in 1996. She returned on 08/08/2022 vulvar biopsy. Biopsy was obtained of the right labia majora which returned with VIN 2.  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  Interval History: Pt reports that she is recovering  well from surgery.  Pain on her vulva is well-controlled, only needing meds for the first day or so.  She is eating and drinking well. She is voiding without issue and having regular bowel movements.  She had some spotting following surgery up until Saturday but no bleeding since.  Denies pain currently.  Has vaginal irritation at the site.  Having issues with back pain today.  Prior postoperative follow-up was delayed due to patient getting a COVID infection.  She is now currently being treated for sinus infection with doxycycline.   Past Medical/Surgical History: Past Medical History:  Diagnosis Date   Allergy    Anxiety    Aortic aneurysm (HCC) 04/2022   S/P REPAIR ( 3 stents)   Chronic back pain    Constipation due to opioid therapy    COVID    1st one was severe, no hospitalization, 2nd and 3rd were mild; long covid symptoms from 05/2018   DDD (degenerative disc disease), lumbar    Depression    GERD (gastroesophageal reflux disease)    past hx of    Gestational diabetes    Heart murmur    yrs ago   Hepatitis    age 13, ? came from surgery done, no liver problems   Hyperlipidemia    Hypertension    pt this pain related - id occasionally high    IFG (impaired fasting glucose)    Migraines    no longer has migraines   Pneumonia 1994   PONV (postoperative nausea and vomiting)    in 1980's none since   Post-menopausal    Pre-diabetes    Scoliosis    Stroke [facility])    Right Upper Extremity Deficit july 2016 and dec 2021  Ulcer    years ago. gastric ulcer.    Past Surgical History:  Procedure Laterality Date   ABDOMINAL AORTIC ENDOVASCULAR STENT GRAFT N/A 05/12/2022   Procedure: ABDOMINAL AORTIC ENDOVASCULAR STENT GRAFT;  Surgeon: Chuck Hint, MD;  Location: [facility] OR;  Service: Vascular;  Laterality: N/A;   ABDOMINAL HYSTERECTOMY     has her ovaries, heavy periods   BACK SURGERY  2011   L5 S1   CHOLECYSTECTOMY N/A 12/05/2021   Procedure: LAPAROSCOPIC  CHOLECYSTECTOMY;  Surgeon: Axel Filler, MD;  Location: [facility] OR;  Service: General;  Laterality: N/A;   COLONOSCOPY  2021   JOINT REPLACEMENT Left    L knee   knee surgeries Left    x 7 before replacement   left ear surgery     yrs ago   POLYPECTOMY     HPP in 2008 DB - colon   VULVECTOMY PARTIAL N/A 11/21/2022   Procedure: VULVECTOMY PARTIAL SIMPLE;  Surgeon: Clide Cliff, MD;  Location: [facility];  Service: Gynecology;  Laterality: N/A;    Family History  Problem Relation Age of Onset   Stroke Mother    Heart failure Father        Triple bypass   Melanoma Father    Colon polyps Sister    Colon polyps Brother    Cancer Maternal Grandmother        unknown type of cancer   Cancer Maternal Grandfather        unknown type of cancer   Stomach cancer Paternal Grandfather    Esophageal cancer Neg Hx    Rectal cancer Neg Hx    Colon cancer Neg Hx    Pancreatic cancer Neg Hx    Breast cancer Neg Hx    Ovarian cancer Neg Hx    Endometrial cancer Neg Hx    Prostate cancer Neg Hx     Social History   Socioeconomic History   Marital status: Married    Spouse name: Not on file   Number of children: 2   Years of education: Not on file   Highest education level: Not on file  Occupational History   Occupation: disabled  Tobacco Use   Smoking status: Former    Current packs/day: 0.00    Average packs/day: 1 pack/day for 18.0 years (18.0 ttl pk-yrs)    Types: Cigarettes    Start date: 14    Quit date: 19    Years since quitting: 33.7    Passive exposure: Current   Smokeless tobacco: Never   Tobacco comments:    Occasional cigarette since quitting when younger  Vaping Use   Vaping status: Never Used  Substance and Sexual Activity   Alcohol use: Not Currently    Comment: Rare   Drug use: No   Sexual activity: Yes  Other Topics Concern   Not on file  Social History Narrative   Not on file   Social Determinants of Health   Financial  Resource Strain: Not on file  Food Insecurity: Not on file  Transportation Needs: Not on file  Physical Activity: Not on file  Stress: Not on file  Social Connections: Unknown (08/05/2021)   Received from [facility]   Social Network    Social Network: Not on file    Current Medications:  Current Outpatient Medications:    acetaminophen (TYLENOL) 500 MG tablet, Take 1,000 mg by mouth every 6 (six) hours as needed for moderate pain., Disp: , Rfl:  Ascorbic Acid (VITAMIN C) 1000 MG tablet, Take 500 mg by mouth daily., Disp: , Rfl:    atorvastatin (LIPITOR) 80 MG tablet, Take 1 tablet (80 mg total) by mouth at bedtime., Disp: 30 tablet, Rfl: 1   b complex vitamins capsule, Take 1 capsule by mouth daily. Raw  B complex, Disp: , Rfl:    benzonatate (TESSALON) 100 MG capsule, Take 100 mg by mouth 3 (three) times daily as needed for cough., Disp: , Rfl:    cetirizine (ZYRTEC) 10 MG tablet, Take 10 mg by mouth daily., Disp: , Rfl:    CINNAMON PO, Take 2 capsules by mouth daily., Disp: , Rfl:    clopidogrel (PLAVIX) 75 MG tablet, Take 75 mg by mouth daily., Disp: , Rfl:    Coenzyme Q10 (CO Q-10) 100 MG CAPS, Take 100 mg by mouth daily., Disp: , Rfl:    diazepam (VALIUM) 5 MG tablet, Take 5 mg by mouth 3 (three) times daily as needed for muscle spasms., Disp: , Rfl:    donepezil (ARICEPT) 5 MG tablet, Take 5 mg by mouth at bedtime., Disp: , Rfl:    DULoxetine (CYMBALTA) 60 MG capsule, Take 60 mg by mouth daily., Disp: , Rfl:    ezetimibe (ZETIA) 10 MG tablet, Take 10 mg by mouth daily., Disp: , Rfl: 3   gabapentin (NEURONTIN) 400 MG capsule, Take 400-800 mg by mouth See admin instructions. Take 400 mg in the morning and and afternoon and 800 mg at bedtidme, Disp: , Rfl:    loperamide (IMODIUM A-D) 2 MG tablet, Take 4 mg by mouth 2 (two) times daily as needed for diarrhea or loose stools., Disp: , Rfl:    losartan (COZAAR) 25 MG tablet, Take 50 mg by mouth every evening., Disp: ,  Rfl:    Magnesium 250 MG TABS, Take 250 mg by mouth at bedtime., Disp: , Rfl:    Omega-3 Fatty Acids (FISH OIL) 1200 MG CAPS, Take 2,400 mg by mouth 2 (two) times daily., Disp: , Rfl:    OVER THE COUNTER MEDICATION, Take 1 tablet by mouth at bedtime as needed (sleep). alteril natural sleep aid, Disp: , Rfl:    promethazine (PHENERGAN) 25 MG tablet, Take 25 mg by mouth 2 (two) times daily as needed for nausea or vomiting., Disp: , Rfl:    senna-docusate (SENOKOT-S) 8.6-50 MG tablet, Take 2 tablets by mouth at bedtime. For AFTER surgery, do not take if having diarrhea, Disp: 30 tablet, Rfl: 0   Taurine 1000 MG CAPS, Take 1,000 mg by mouth 3 (three) times daily., Disp: , Rfl:    TURMERIC PO, Take 1 tablet by mouth daily., Disp: , Rfl:    vitamin D3 (CHOLECALCIFEROL) 25 MCG tablet, Take 1,000 Units by mouth daily., Disp: , Rfl:    zolpidem (AMBIEN CR) 6.25 MG CR tablet, Take 6.25 mg by mouth at bedtime as needed for sleep., Disp: , Rfl: 5  Review of Symptoms: Complete 10-system review is negative except as above in Interval History.  Physical Exam: BP (!) 150/82 Comment: manual recheck, MD notified  Pulse 74   Temp 98.7 F (37.1 C) (Oral)   Resp 16   Ht 5\' 5"  (1.651 m)   Wt 140 lb 8 oz (63.7 kg)   SpO2 94%   BMI 23.38 kg/m  General: Alert, oriented, no acute distress. HEENT: Normocephalic, atraumatic. Neck symmetric without masses. Sclera anicteric.  Chest: Normal work of breathing. Abdomen: Soft, nontender.  Extremities: Grossly normal range of motion.  Warm,  well perfused.  No edema bilaterally. Skin: No rashes or lesions noted. GU: External genitalia with evidence of posterior vulvectomy, healing well.  One superficial area of separation that is continuing to heal well with suture exposed.  Edges of colectomy with some white changes.  Exam chaperoned by Kimberly [place], CMA   Laboratory & Radiologic Studies: Surgical pathology (11/21/22): FINAL MICROSCOPIC DIAGNOSIS:   A. 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  -  High-grade VIN is present multifocally on the blue inked (12 the  3:00), green inked (3-6 o'clock) and yellow (6-9 o'clock) inked margins.  -  P16 is positive for blocklike positivity supporting the diagnosis and  consistent with HPV associated squamous intraepithelial lesion; Ki-67  shows a high full-thickness proliferation rate supporting the diagnosis;  p53 is wild-type against HPV independent/differentiated type VIN.

## 2022-12-25 NOTE — Patient Instructions (Signed)
It was a pleasure to see you in clinic today. - Return visit planned for 2 months You are healing well  Thank you very much for allowing me to provide care for you today.  I appreciate your confidence in choosing our Gynecologic Oncology team at [facility].  If you have any questions about your visit today please call our office or send [place] a MyChart message and we will get back to you as soon as possible.

## 2022-12-28 DIAGNOSIS — I10 Essential (primary) hypertension: Secondary | ICD-10-CM | POA: Diagnosis not present

## 2022-12-28 DIAGNOSIS — Z1212 Encounter for screening for malignant neoplasm of rectum: Secondary | ICD-10-CM | POA: Diagnosis not present

## 2022-12-28 DIAGNOSIS — E1169 Type 2 diabetes mellitus with other specified complication: Secondary | ICD-10-CM | POA: Diagnosis not present

## 2022-12-28 DIAGNOSIS — E785 Hyperlipidemia, unspecified: Secondary | ICD-10-CM | POA: Diagnosis not present

## 2022-12-29 DIAGNOSIS — I10 Essential (primary) hypertension: Secondary | ICD-10-CM | POA: Diagnosis not present

## 2022-12-29 DIAGNOSIS — I639 Cerebral infarction, unspecified: Secondary | ICD-10-CM | POA: Diagnosis not present

## 2022-12-29 DIAGNOSIS — J329 Chronic sinusitis, unspecified: Secondary | ICD-10-CM | POA: Diagnosis not present

## 2022-12-29 DIAGNOSIS — R58 Hemorrhage, not elsewhere classified: Secondary | ICD-10-CM | POA: Diagnosis not present

## 2023-01-04 DIAGNOSIS — I700 Atherosclerosis of aorta: Secondary | ICD-10-CM | POA: Diagnosis not present

## 2023-01-04 DIAGNOSIS — Z0000 Encounter for general adult medical examination without abnormal findings: Secondary | ICD-10-CM | POA: Diagnosis not present

## 2023-01-04 DIAGNOSIS — I2510 Atherosclerotic heart disease of native coronary artery without angina pectoris: Secondary | ICD-10-CM | POA: Diagnosis not present

## 2023-01-04 DIAGNOSIS — I10 Essential (primary) hypertension: Secondary | ICD-10-CM | POA: Diagnosis not present

## 2023-01-04 DIAGNOSIS — M51362 Other intervertebral disc degeneration, lumbar region with discogenic back pain and lower extremity pain: Secondary | ICD-10-CM | POA: Diagnosis not present

## 2023-01-04 DIAGNOSIS — R413 Other amnesia: Secondary | ICD-10-CM | POA: Diagnosis not present

## 2023-01-04 DIAGNOSIS — G8929 Other chronic pain: Secondary | ICD-10-CM | POA: Diagnosis not present

## 2023-01-04 DIAGNOSIS — R911 Solitary pulmonary nodule: Secondary | ICD-10-CM | POA: Diagnosis not present

## 2023-01-04 DIAGNOSIS — E1169 Type 2 diabetes mellitus with other specified complication: Secondary | ICD-10-CM | POA: Diagnosis not present

## 2023-01-04 DIAGNOSIS — R197 Diarrhea, unspecified: Secondary | ICD-10-CM | POA: Diagnosis not present

## 2023-01-04 DIAGNOSIS — D071 Carcinoma in situ of vulva: Secondary | ICD-10-CM | POA: Diagnosis not present

## 2023-01-04 DIAGNOSIS — R82998 Other abnormal findings in urine: Secondary | ICD-10-CM | POA: Diagnosis not present

## 2023-01-04 DIAGNOSIS — J309 Allergic rhinitis, unspecified: Secondary | ICD-10-CM | POA: Diagnosis not present

## 2023-01-04 DIAGNOSIS — Z23 Encounter for immunization: Secondary | ICD-10-CM | POA: Diagnosis not present

## 2023-01-10 ENCOUNTER — Encounter: Payer: Self-pay | Admitting: Obstetrics and Gynecology

## 2023-02-05 ENCOUNTER — Inpatient Hospital Stay: Payer: PPO | Attending: Psychiatry | Admitting: Psychiatry

## 2023-02-05 VITALS — BP 113/60 | HR 79 | Temp 98.1°F | Resp 20 | Wt 138.8 lb

## 2023-02-05 DIAGNOSIS — L292 Pruritus vulvae: Secondary | ICD-10-CM | POA: Insufficient documentation

## 2023-02-05 DIAGNOSIS — N901 Moderate vulvar dysplasia: Secondary | ICD-10-CM | POA: Insufficient documentation

## 2023-02-05 DIAGNOSIS — Z9079 Acquired absence of other genital organ(s): Secondary | ICD-10-CM | POA: Diagnosis not present

## 2023-02-05 DIAGNOSIS — D071 Carcinoma in situ of vulva: Secondary | ICD-10-CM | POA: Diagnosis not present

## 2023-02-05 NOTE — Patient Instructions (Signed)
It was a pleasure to see you in clinic today. - Will let you know the results of the biopsy. - Return visit planned for 3 months unless needed sooner.  Thank you very much for allowing me to provide care for you today.  I appreciate your confidence in choosing our Gynecologic Oncology team at [facility].  If you have any questions about your visit today please call our office or send [place] a MyChart message and we will get back to you as soon as possible.

## 2023-02-05 NOTE — Progress Notes (Unsigned)
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  VIN3: - S/p vulvectomy 10/2022 with margins positive for dysplasia, no invasive cancer.  -On exam today, no overt areas of dysplasia grossly.  On colposcopy, possible mild changes. - Biopsy obtained today to determine if ongoing dysplasia versus other chronic changes like lichen. -Will follow-up biopsy results and treat accordingly. - Reviewed signs/symptoms of recurrence.  - Surveillance reviewed. Follow-up q6 months x2 years then annually.  However, will have patient follow-up sooner initially given ongoing itching.  Patient may need some additional treatment at this site, possibly with laser, to treat residual disease if needed based on follow-up exam and/or biopsy results.  RTC 3 months.  Clide Cliff, MD Gynecologic Oncology   Medical Decision Making I personally spent  TOTAL 25 minutes face-to-face and non-face-to-face in the care of this patient, which includes all pre, intra, and post visit time on the date of service.   ----------------------- Reason for Visit: Surveillance  Treatment History: Patient presented to her OB/GYN on 12/01/2021 for occasional vulvar itching around the labia. She had previously reported a history of VIN status post excision in 1996. She returned on 08/08/2022 vulvar biopsy. Biopsy was obtained of the right labia majora which returned with VIN 2.  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  Interval History: Since her last visit, patient reports more itching on the vulva including near her rectum.  She has been using hemorrhoid cream at times but this has not stopped the itching.  She notes some blood occasionally on the toilet paper after wiping after bowel movement.  She is not aware of any new  lesions on the vulva.  She feels that she has been going on for about 10 days.   Past Medical/Surgical History: Past Medical History:  Diagnosis Date   Allergy    Anxiety    Aortic aneurysm (HCC) 04/2022   S/P REPAIR ( 3 stents)   Chronic back pain    Constipation due to opioid therapy    COVID    1st one was severe, no hospitalization, 2nd and 3rd were mild; long covid symptoms from 05/2018   DDD (degenerative disc disease), lumbar    Depression    GERD (gastroesophageal reflux disease)    past hx of    Gestational diabetes    Heart murmur    yrs ago   Hepatitis    age 67, ? 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 gastric ulcer.    Past Surgical History:  Procedure Laterality Date   ABDOMINAL AORTIC ENDOVASCULAR STENT GRAFT N/A 05/12/2022   Procedure: ABDOMINAL AORTIC ENDOVASCULAR STENT GRAFT;  Surgeon: Chuck Hint, MD;  Location: [facility] OR;  Service: Vascular;  Laterality: N/A;   ABDOMINAL HYSTERECTOMY     has her ovaries, heavy periods  BACK SURGERY  2011   L5 S1   CHOLECYSTECTOMY N/A 12/05/2021   Procedure: LAPAROSCOPIC CHOLECYSTECTOMY;  Surgeon: Axel Filler, MD;  Location: [facility] OR;  Service: General;  Laterality: N/A;   COLONOSCOPY  2021   JOINT REPLACEMENT Left    L knee   knee surgeries Left    x 7 before replacement   left ear surgery     yrs ago   POLYPECTOMY     HPP in 2008 DB - colon   VULVECTOMY PARTIAL N/A 11/21/2022   Procedure: VULVECTOMY PARTIAL SIMPLE;  Surgeon: Clide Cliff, MD;  Location: [facility];  Service: Gynecology;  Laterality:  N/A;    Family History  Problem Relation Age of Onset   Stroke Mother    Heart failure Father        Triple bypass   Melanoma Father    Colon polyps Sister    Colon polyps Brother    Cancer Maternal Grandmother        unknown type of cancer   Cancer Maternal Grandfather        unknown type of cancer   Stomach cancer Paternal Grandfather    Esophageal cancer Neg Hx    Rectal cancer Neg Hx    Colon cancer Neg Hx    Pancreatic cancer Neg Hx    Breast cancer Neg Hx    Ovarian cancer Neg Hx    Endometrial cancer Neg Hx    Prostate cancer Neg Hx     Social History   Socioeconomic History   Marital status: Married    Spouse name: Not on file   Number of children: 2   Years of education: Not on file   Highest education level: Not on file  Occupational History   Occupation: disabled  Tobacco Use   Smoking status: Former    Current packs/day: 0.00    Average packs/day: 1 pack/day for 18.0 years (18.0 ttl pk-yrs)    Types: Cigarettes    Start date: 47    Quit date: 80    Years since quitting: 33.8    Passive exposure: Current   Smokeless tobacco: Never   Tobacco comments:    Occasional cigarette since quitting when younger  Vaping Use   Vaping status: Never Used  Substance and Sexual Activity   Alcohol use: Not Currently    Comment: Rare   Drug use: No   Sexual activity: Yes  Other Topics Concern   Not on file  Social History Narrative   Not on file   Social Determinants of Health   Financial Resource Strain: Not on file  Food Insecurity: Not on file  Transportation Needs: Not on file  Physical Activity: Not on file  Stress: Not on file  Social Connections: Unknown (08/05/2021)   Received from [facility]   Social Network    Social Network: Not on file    Current Medications:  Current Outpatient Medications:    acetaminophen (TYLENOL) 500 MG tablet, Take 1,000 mg by mouth every 6 (six) hours as needed for moderate pain., Disp: ,  Rfl:    Ascorbic Acid (VITAMIN C) 1000 MG tablet, Take 500 mg by mouth daily., Disp: , Rfl:    b complex vitamins capsule, Take 1 capsule by mouth daily. Raw  B complex, Disp: , Rfl:    benzonatate (TESSALON) 100 MG capsule, Take 100 mg by mouth 3 (three) times daily as needed for cough., Disp: , Rfl:    cetirizine (ZYRTEC)  10 MG tablet, Take 10 mg by mouth daily., Disp: , Rfl:    CINNAMON PO, Take 2 capsules by mouth daily., Disp: , Rfl:    clopidogrel (PLAVIX) 75 MG tablet, Take 75 mg by mouth daily., Disp: , Rfl:    Coenzyme Q10 (CO Q-10) 100 MG CAPS, Take 100 mg by mouth daily., Disp: , Rfl:    diazepam (VALIUM) 5 MG tablet, Take 5 mg by mouth 3 (three) times daily as needed for muscle spasms., Disp: , Rfl:    donepezil (ARICEPT) 5 MG tablet, Take 5 mg by mouth at bedtime., Disp: , Rfl:    DULoxetine (CYMBALTA) 60 MG capsule, Take 60 mg by mouth daily., Disp: , Rfl:    ezetimibe (ZETIA) 10 MG tablet, Take 10 mg by mouth daily., Disp: , Rfl: 3   gabapentin (NEURONTIN) 400 MG capsule, Take 400-800 mg by mouth See admin instructions. Take 400 mg in the morning and and afternoon and 800 mg at bedtidme, Disp: , Rfl:    loperamide (IMODIUM A-D) 2 MG tablet, Take 4 mg by mouth 2 (two) times daily as needed for diarrhea or loose stools., Disp: , Rfl:    losartan (COZAAR) 25 MG tablet, Take 50 mg by mouth every evening., Disp: , Rfl:    Magnesium 250 MG TABS, Take 250 mg by mouth at bedtime., Disp: , Rfl:    Omega-3 Fatty Acids (FISH OIL) 1200 MG CAPS, Take 2,400 mg by mouth 2 (two) times daily., Disp: , Rfl:    OVER THE COUNTER MEDICATION, Take 1 tablet by mouth at bedtime as needed (sleep). alteril natural sleep aid, Disp: , Rfl:    promethazine (PHENERGAN) 25 MG tablet, Take 25 mg by mouth 2 (two) times daily as needed for nausea or vomiting., Disp: , Rfl:    triamcinolone (NASACORT) 55 MCG/ACT AERO nasal inhaler, Place 2 sprays into the nose daily., Disp: , Rfl:    TURMERIC PO, Take 1 tablet by  mouth daily., Disp: , Rfl:    vitamin D3 (CHOLECALCIFEROL) 25 MCG tablet, Take 1,000 Units by mouth daily., Disp: , Rfl:    zolpidem (AMBIEN CR) 6.25 MG CR tablet, Take 6.25 mg by mouth at bedtime as needed for sleep., Disp: , Rfl: 5   atorvastatin (LIPITOR) 80 MG tablet, Take 1 tablet (80 mg total) by mouth at bedtime., Disp: 30 tablet, Rfl: 1  Review of Symptoms: Complete 10-system review is positive for: back pain, vulvar/rectal itching  Physical Exam: BP 113/60 (BP Location: Left Arm, Patient Position: Sitting)   Pulse 79   Temp 98.1 F (36.7 C) (Oral)   Resp 20   Wt 138 lb 12.8 oz (63 kg)   SpO2 100%   BMI 23.10 kg/m  General: Alert, oriented, no acute distress. HEENT: Normocephalic, atraumatic. Neck symmetric without masses. Sclera anicteric.  Chest: Normal work of breathing. Clear to auscultation bilaterally.   Cardiovascular: Regular rate and rhythm, no murmurs. Abdomen: Soft, nontender.  Normoactive bowel sounds.  No masses appreciated.   Extremities: Grossly normal range of motion.  Warm, well perfused.  No edema bilaterally. Skin: No rashes or lesions noted. Lymphatics: No cervical, supraclavicular, or inguinal adenopathy. GU: External genitalia well-healed from prior posterior vulvectomy.  No overt lesions noted.  Speculum exam with normal vaginal mucosa and cuff.  Bimanual exam with smooth vaginal cuff, no nodularity or pelvic mass. Exam chaperoned by Kimberly [place]az[place], CMA  VULVAR COLPOSCOPY WITH BIOPSY PROCEDURE NOTE  Procedure Details: After appropriate verbal informed consent was obtained,  a timeout was performed. Acetic acid was applied to the vulva and the vulva was inspected with the colposcope with the findings as noted below. Betadine was applied to the planned biopsy site. Lidocaine 2% was infiltrated, 1ml. A 3mm bunch biopsy was used to obtain a biopsy. Silver nitrate was applied for hemostasis. The vulva was then cleansed of the acetic acid with water. The patient  tolerated the procedure well.   Adequate Exam: Yes  Biopsy Specimen: Right medial posterior labia  Condition: Stable. Patient tolerated procedure well.  Complications: None  Findings: Mild acetowhite changes of the right medial posterior labia with 3mm of slight thickening   Colposcopic Impression: VIN1     Laboratory & Radiologic Studies: None

## 2023-02-06 ENCOUNTER — Encounter: Payer: Self-pay | Admitting: Psychiatry

## 2023-02-07 LAB — SURGICAL PATHOLOGY

## 2023-02-20 ENCOUNTER — Telehealth: Payer: Self-pay | Admitting: *Deleted

## 2023-02-20 ENCOUNTER — Telehealth: Payer: Self-pay | Admitting: Psychiatry

## 2023-02-20 NOTE — Telephone Encounter (Signed)
Called pt to review biopsy results. No answer. Left generic VM. Will try again at another time.

## 2023-02-20 NOTE — Telephone Encounter (Signed)
Spoke with [name] and phone visit set up with Dr. Alvester Morin for Monday, 12/2 at 1245. Pt thanked the office for calling and she was sorry she missed Dr. Ferdinand Blas call.

## 2023-02-26 ENCOUNTER — Inpatient Hospital Stay: Payer: PPO | Attending: Psychiatry | Admitting: Psychiatry

## 2023-02-26 ENCOUNTER — Encounter: Payer: Self-pay | Admitting: Psychiatry

## 2023-02-26 DIAGNOSIS — D071 Carcinoma in situ of vulva: Secondary | ICD-10-CM | POA: Diagnosis not present

## 2023-02-26 NOTE — Progress Notes (Signed)
Gynecologic Oncology Telehealth Follow-Up Note  I connected with Deborah Peters on 02/26/23 at 12:45 PM EST by telephone and verified that I am speaking with the correct person using two identifiers.  I discussed the limitations, risks, security and privacy concerns of performing an evaluation and management service by telemedicine and the availability of in-person appointments. I also discussed with the patient that there may be a patient responsible charge related to this service. The patient expressed understanding and agreed to proceed.  Other persons participating in the visit and their role in the encounter: None.  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 Recurrent VIN3 on biopsy.  VIN3: - S/p vulvectomy 10/2022 with margins positive for dysplasia, no invasive cancer.  -At last visit, colposcopy with some mild changes of the right medial posterior labia.  Biopsy completed and returned with VIN 3. - We reviewed that although surgical excision is the mainstay of treatment, ablative therapy or pharmacologic treatment are options for some patients in certain clinical scenarios. -Given the small area and close surveillance since initial treatment with vulvectomy, low concern for invasive cancer at this location.  Given this, feel that laser or topical treatment are appropriate.  Pros and cons of each option reviewed with patient.  She wishes to proceed with laser ablation. - Patient was consented for: CO2 laser of the vulva on TBD.  The risks of surgery were discussed in detail and she understands these to including but not limited to bleeding requiring a blood transfusion, infection, injury to adjacent organs (including but not limited to the clitoris, urethra, anus, nearby vessels and nerves),  unforseen complication, and possible need for re-exploration.  If the patient experiences any of these events, she understands that her hospitalization or recovery may be prolonged and that she may need to take additional medications for a prolonged period. The patient will receive DVT and antibiotic prophylaxis as indicated. She voiced a clear understanding. She had the opportunity to ask questions and informed consent was obtained today. She wishes to proceed.  She does not require preoperative clearance. Her METs are >4.  All preoperative instructions were reviewed. Postoperative expectations were also reviewed.  She will return for a preop nurse visit closer to time of surgery when determined.   RTC postop.  Clide Cliff, MD Gynecologic Oncology   Medical Decision Making I personally spent  TOTAL 11 minutes on a virtual phone visit.   ----------------------- Reason for Visit: Follow-up, treatment discussion  Treatment History: Patient presented to her OB/GYN on 12/01/2021 for occasional vulvar itching around the labia. She had previously reported a history of VIN status post excision in 1996. She returned on 08/08/2022 vulvar biopsy. Biopsy was obtained of the right labia majora which returned with VIN 2.  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 Vulvar biopsy 02/05/23: VIN3  Interval History: No changes since last visit.  Biopsy results returned with VIN 3  Past Medical/Surgical History: Past Medical History:  Diagnosis Date   Allergy    Anxiety    Aortic aneurysm (HCC) 04/2022   S/P REPAIR ( 3 stents)   Chronic back pain    Constipation due to opioid therapy    COVID    1st one was severe, no hospitalization, 2nd and 3rd were mild; long covid symptoms from 05/2018  DDD (degenerative disc disease), lumbar    Depression    GERD (gastroesophageal reflux disease)    past hx of    Gestational diabetes    Heart murmur    yrs ago    Hepatitis    age 55, ? 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 gastric ulcer.    Past Surgical History:  Procedure Laterality Date   ABDOMINAL AORTIC ENDOVASCULAR STENT GRAFT N/A 05/12/2022   Procedure: ABDOMINAL AORTIC ENDOVASCULAR STENT GRAFT;  Surgeon: Chuck Hint, MD;  Location: [facility] OR;  Service: Vascular;  Laterality: N/A;   ABDOMINAL HYSTERECTOMY     has her ovaries, heavy periods   BACK SURGERY  2011   L5 S1   CHOLECYSTECTOMY N/A 12/05/2021   Procedure: LAPAROSCOPIC CHOLECYSTECTOMY;  Surgeon: Axel Filler, MD;  Location: [facility] OR;  Service: General;  Laterality: N/A;   COLONOSCOPY  2021   JOINT REPLACEMENT Left    L knee   knee surgeries Left    x 7 before replacement   left ear surgery     yrs ago   POLYPECTOMY     HPP in 2008 DB - colon   VULVECTOMY PARTIAL N/A 11/21/2022   Procedure: VULVECTOMY PARTIAL SIMPLE;  Surgeon: Clide Cliff, MD;  Location: [facility];  Service: Gynecology;  Laterality: N/A;    Family History  Problem Relation Age of Onset   Stroke Mother    Heart failure Father        Triple bypass   Melanoma Father    Colon polyps Sister    Colon polyps Brother    Cancer Maternal Grandmother        unknown type of cancer   Cancer Maternal Grandfather        unknown type of cancer   Stomach cancer Paternal Grandfather    Esophageal cancer Neg Hx    Rectal cancer Neg Hx    Colon cancer Neg Hx    Pancreatic cancer Neg Hx    Breast cancer Neg Hx    Ovarian cancer Neg Hx    Endometrial cancer Neg Hx    Prostate cancer Neg Hx      Social History   Socioeconomic History   Marital status: Married    Spouse name: Not on file   Number of children: 2   Years of education: Not on file   Highest education level: Not on file  Occupational History   Occupation: disabled  Tobacco Use   Smoking status: Former    Current packs/day: 0.00    Average packs/day: 1 pack/day for 18.0 years (18.0 ttl pk-yrs)    Types: Cigarettes    Start date: 64    Quit date: 54    Years since quitting: 33.9    Passive exposure: Current   Smokeless tobacco: Never   Tobacco comments:    Occasional cigarette since quitting when younger  Vaping Use   Vaping status: Never Used  Substance and Sexual Activity  Alcohol use: Not Currently    Comment: Rare   Drug use: No   Sexual activity: Yes  Other Topics Concern   Not on file  Social History Narrative   Not on file   Social Determinants of Health   Financial Resource Strain: Not on file  Food Insecurity: Not on file  Transportation Needs: Not on file  Physical Activity: Not on file  Stress: Not on file  Social Connections: Unknown (08/05/2021)   Received from [facility]   Social Network    Social Network: Not on file    Current Medications:  Current Outpatient Medications:    acetaminophen (TYLENOL) 500 MG tablet, Take 1,000 mg by mouth every 6 (six) hours as needed for moderate pain., Disp: , Rfl:    Ascorbic Acid (VITAMIN C) 1000 MG tablet, Take 500 mg by mouth daily., Disp: , Rfl:    atorvastatin (LIPITOR) 80 MG tablet, Take 1 tablet (80 mg total) by mouth at bedtime., Disp: 30 tablet, Rfl: 1   b complex vitamins capsule, Take 1 capsule by mouth daily. Raw  B complex, Disp: , Rfl:    benzonatate (TESSALON) 100 MG capsule, Take 100 mg by mouth 3 (three) times daily as needed for cough., Disp: , Rfl:    cetirizine (ZYRTEC) 10 MG tablet, Take 10 mg by mouth daily., Disp: , Rfl:    CINNAMON PO, Take 2 capsules by mouth daily., Disp: , Rfl:     clopidogrel (PLAVIX) 75 MG tablet, Take 75 mg by mouth daily., Disp: , Rfl:    Coenzyme Q10 (CO Q-10) 100 MG CAPS, Take 100 mg by mouth daily., Disp: , Rfl:    diazepam (VALIUM) 5 MG tablet, Take 5 mg by mouth 3 (three) times daily as needed for muscle spasms., Disp: , Rfl:    donepezil (ARICEPT) 5 MG tablet, Take 5 mg by mouth at bedtime., Disp: , Rfl:    DULoxetine (CYMBALTA) 60 MG capsule, Take 60 mg by mouth daily., Disp: , Rfl:    ezetimibe (ZETIA) 10 MG tablet, Take 10 mg by mouth daily., Disp: , Rfl: 3   gabapentin (NEURONTIN) 400 MG capsule, Take 400-800 mg by mouth See admin instructions. Take 400 mg in the morning and and afternoon and 800 mg at bedtidme, Disp: , Rfl:    loperamide (IMODIUM A-D) 2 MG tablet, Take 4 mg by mouth 2 (two) times daily as needed for diarrhea or loose stools., Disp: , Rfl:    losartan (COZAAR) 25 MG tablet, Take 50 mg by mouth every evening., Disp: , Rfl:    Magnesium 250 MG TABS, Take 250 mg by mouth at bedtime., Disp: , Rfl:    Omega-3 Fatty Acids (FISH OIL) 1200 MG CAPS, Take 2,400 mg by mouth 2 (two) times daily., Disp: , Rfl:    OVER THE COUNTER MEDICATION, Take 1 tablet by mouth at bedtime as needed (sleep). alteril natural sleep aid, Disp: , Rfl:    promethazine (PHENERGAN) 25 MG tablet, Take 25 mg by mouth 2 (two) times daily as needed for nausea or vomiting., Disp: , Rfl:    triamcinolone (NASACORT) 55 MCG/ACT AERO nasal inhaler, Place 2 sprays into the nose daily., Disp: , Rfl:    TURMERIC PO, Take 1 tablet by mouth daily., Disp: , Rfl:    vitamin D3 (CHOLECALCIFEROL) 25 MCG tablet, Take 1,000 Units by mouth daily., Disp: , Rfl:    zolpidem (AMBIEN CR) 6.25 MG CR tablet, Take 6.25 mg by mouth at  bedtime as needed for sleep., Disp: , Rfl: 5  Review of Symptoms: Pertinent positives as per HPI.  Physical Exam: Deferred given limitations of phone visit.  Laboratory & Radiologic Studies: Surgical pathology (02/05/23): A. 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       Negative for invasive carcinoma.

## 2023-03-26 ENCOUNTER — Telehealth: Payer: Self-pay | Admitting: *Deleted

## 2023-03-26 NOTE — Telephone Encounter (Signed)
Spoke with Deborah Peters who called the office with questions about her scheduled surgery on  04/03/23. Pt is requesting general anaesthesia and not twilight sleep. Pt advised that [facility] Pre-admission testing will call her with her pre-admit appt. And they may even call her the day before and she will be able to speak with anesthesia at this appointment. Pt was reassured as well that the office will also call her on 1/6 for pre-op call. Pt thanked the office and had no further concerns at this time.

## 2023-04-02 ENCOUNTER — Telehealth: Payer: Self-pay | Admitting: *Deleted

## 2023-04-02 ENCOUNTER — Other Ambulatory Visit: Payer: Self-pay | Admitting: Gynecologic Oncology

## 2023-04-02 ENCOUNTER — Encounter (HOSPITAL_BASED_OUTPATIENT_CLINIC_OR_DEPARTMENT_OTHER): Payer: Self-pay | Admitting: Psychiatry

## 2023-04-02 DIAGNOSIS — D071 Carcinoma in situ of vulva: Secondary | ICD-10-CM

## 2023-04-02 DIAGNOSIS — G8918 Other acute postprocedural pain: Secondary | ICD-10-CM

## 2023-04-02 MED ORDER — LIDOCAINE 5 % EX OINT: 1.0000 | TOPICAL_OINTMENT | Freq: Three times a day (TID) | CUTANEOUS | 1 refills | Status: AC | PRN

## 2023-04-02 MED ORDER — SENNOSIDES-DOCUSATE SODIUM 8.6-50 MG PO TABS: 2.0000 | ORAL_TABLET | Freq: Every evening | ORAL | 0 refills | Status: DC | PRN

## 2023-04-02 NOTE — Telephone Encounter (Signed)
 Telephone call to check on pre-operative status.  Patient compliant with pre-operative instructions.  Reinforced nothing to eat after midnight. Clear liquids until 0730. Patient to arrive at 0830.  No questions or concerns voiced.  Instructed to call for any needs.  ?

## 2023-04-02 NOTE — Progress Notes (Signed)
 Spoke w/ via phone for pre-op interview--- Deborah Peters needs dos----  Istat per anesthesia.       Lab results------Current EKG in Epic dated 04/19/22. COVID test -----patient states asymptomatic no test needed Arrive at -------0845 NPO after MN NO Solid Food.  Clear liquids from MN until---0745 Med rec completed Medications to take morning of surgery -----Gabapentin , cymbalta  and Valium  PRN Diabetic medication ----- Patient instructed no nail polish to be worn day of surgery Patient instructed to bring photo id and insurance card day of surgery Patient aware to have Driver (ride ) / caregiver    for 24 hours after surgery - Husband [name] Patient Special Instructions ----- Pre-Op special Instructions ----- Patient verbalized understanding of instructions that were given at this phone interview. Patient denies chest pain, sob, fever, cough at the interview.   Per Dr Eldonna pt ok to hold Plavix  today and day of surgery.

## 2023-04-03 ENCOUNTER — Ambulatory Visit (HOSPITAL_BASED_OUTPATIENT_CLINIC_OR_DEPARTMENT_OTHER)
Admission: RE | Admit: 2023-04-03 | Discharge: 2023-04-03 | Disposition: A | Discharge status: Home / Self Care | Payer: PPO | Source: Ambulatory Visit | Attending: Psychiatry | Admitting: Psychiatry

## 2023-04-03 ENCOUNTER — Other Ambulatory Visit: Payer: Self-pay

## 2023-04-03 ENCOUNTER — Ambulatory Visit (HOSPITAL_BASED_OUTPATIENT_CLINIC_OR_DEPARTMENT_OTHER): Payer: Self-pay

## 2023-04-03 ENCOUNTER — Encounter (HOSPITAL_BASED_OUTPATIENT_CLINIC_OR_DEPARTMENT_OTHER): Payer: Self-pay | Admitting: Psychiatry

## 2023-04-03 ENCOUNTER — Encounter (HOSPITAL_BASED_OUTPATIENT_CLINIC_OR_DEPARTMENT_OTHER)
Admission: RE | Disposition: A | Discharge status: Home / Self Care | Payer: Self-pay | Source: Ambulatory Visit | Attending: Psychiatry

## 2023-04-03 ENCOUNTER — Telehealth: Payer: Self-pay

## 2023-04-03 DIAGNOSIS — N903 Dysplasia of vulva, unspecified: Secondary | ICD-10-CM

## 2023-04-03 DIAGNOSIS — I2510 Atherosclerotic heart disease of native coronary artery without angina pectoris: Secondary | ICD-10-CM | POA: Diagnosis not present

## 2023-04-03 DIAGNOSIS — I10 Essential (primary) hypertension: Secondary | ICD-10-CM | POA: Insufficient documentation

## 2023-04-03 DIAGNOSIS — D071 Carcinoma in situ of vulva: Secondary | ICD-10-CM | POA: Diagnosis not present

## 2023-04-03 DIAGNOSIS — G8929 Other chronic pain: Secondary | ICD-10-CM | POA: Diagnosis not present

## 2023-04-03 DIAGNOSIS — Z87891 Personal history of nicotine dependence: Secondary | ICD-10-CM | POA: Insufficient documentation

## 2023-04-03 DIAGNOSIS — M549 Dorsalgia, unspecified: Secondary | ICD-10-CM | POA: Diagnosis not present

## 2023-04-03 DIAGNOSIS — E119 Type 2 diabetes mellitus without complications: Secondary | ICD-10-CM | POA: Insufficient documentation

## 2023-04-03 DIAGNOSIS — E785 Hyperlipidemia, unspecified: Secondary | ICD-10-CM | POA: Diagnosis not present

## 2023-04-03 DIAGNOSIS — F418 Other specified anxiety disorders: Secondary | ICD-10-CM

## 2023-04-03 DIAGNOSIS — K219 Gastro-esophageal reflux disease without esophagitis: Secondary | ICD-10-CM | POA: Insufficient documentation

## 2023-04-03 HISTORY — PX: CO2 LASER APPLICATION: SHX5778

## 2023-04-03 LAB — POCT I-STAT, CHEM 8
BUN: 6 mg/dL — ABNORMAL LOW (ref 8–23)
Calcium, Ion: 1.23 mmol/L (ref 1.15–1.40)
Chloride: 105 mmol/L (ref 98–111)
Creatinine, Ser: 0.8 mg/dL (ref 0.44–1.00)
Glucose, Bld: 122 mg/dL — ABNORMAL HIGH (ref 70–99)
HCT: 39 % (ref 36.0–46.0)
Hemoglobin: 13.3 g/dL (ref 12.0–15.0)
Potassium: 3.8 mmol/L (ref 3.5–5.1)
Sodium: 138 mmol/L (ref 135–145)
TCO2: 21 mmol/L — ABNORMAL LOW (ref 22–32)

## 2023-04-03 SURGERY — CO2 LASER APPLICATION
Anesthesia: General | Site: Vulva

## 2023-04-03 MED ORDER — OXYCODONE HCL 5 MG PO TABS: 5.0000 mg | ORAL_TABLET | Freq: Four times a day (QID) | ORAL | 0 refills | Status: DC | PRN

## 2023-04-03 MED ORDER — ACETAMINOPHEN 500 MG PO TABS
ORAL_TABLET | ORAL | Status: AC
  Filled 2023-04-03: qty 2

## 2023-04-03 MED ORDER — PHENYLEPHRINE 80 MCG/ML (10ML) SYRINGE FOR IV PUSH (FOR BLOOD PRESSURE SUPPORT)
PREFILLED_SYRINGE | INTRAVENOUS | Status: DC | PRN
  Administered 2023-04-03 (×4): 80 ug via INTRAVENOUS

## 2023-04-03 MED ORDER — ONDANSETRON HCL 4 MG/2ML IJ SOLN
INTRAMUSCULAR | Status: DC | PRN
  Administered 2023-04-03: 4 mg via INTRAVENOUS

## 2023-04-03 MED ORDER — ACETIC ACID 5 % SOLN
Status: AC
  Filled 2023-04-03: qty 50

## 2023-04-03 MED ORDER — SILVER SULFADIAZINE 1 % EX CREA
TOPICAL_CREAM | CUTANEOUS | Status: DC | PRN
Start: 2023-04-03 — End: 2023-04-03
  Administered 2023-04-03: 1 via TOPICAL

## 2023-04-03 MED ORDER — MIDAZOLAM HCL 2 MG/2ML IJ SOLN
INTRAMUSCULAR | Status: DC | PRN
  Administered 2023-04-03: 2 mg via INTRAVENOUS

## 2023-04-03 MED ORDER — LIDOCAINE 2% (20 MG/ML) 5 ML SYRINGE
INTRAMUSCULAR | Status: DC | PRN
  Administered 2023-04-03: 60 mg via INTRAVENOUS

## 2023-04-03 MED ORDER — FENTANYL CITRATE (PF) 250 MCG/5ML IJ SOLN
INTRAMUSCULAR | Status: DC | PRN
  Administered 2023-04-03: 25 ug via INTRAVENOUS
  Administered 2023-04-03: 50 ug via INTRAVENOUS
  Administered 2023-04-03: 25 ug via INTRAVENOUS

## 2023-04-03 MED ORDER — BUPIVACAINE HCL (PF) 0.25 % IJ SOLN
INTRAMUSCULAR | Status: AC
  Filled 2023-04-03: qty 30

## 2023-04-03 MED ORDER — PROPOFOL 10 MG/ML IV BOLUS
INTRAVENOUS | Status: AC
  Filled 2023-04-03: qty 20

## 2023-04-03 MED ORDER — MONSELS FERRIC SUBSULFATE EX SOLN
CUTANEOUS | Status: AC
  Filled 2023-04-03: qty 8

## 2023-04-03 MED ORDER — ACETAMINOPHEN 500 MG PO TABS
1000.0000 mg | ORAL_TABLET | ORAL | Status: AC
  Administered 2023-04-03: 500 mg via ORAL

## 2023-04-03 MED ORDER — KETOROLAC TROMETHAMINE 30 MG/ML IJ SOLN
INTRAMUSCULAR | Status: DC | PRN
  Administered 2023-04-03: 30 mg via INTRAVENOUS

## 2023-04-03 MED ORDER — BUPIVACAINE HCL 0.25 % IJ SOLN
INTRAMUSCULAR | Status: DC | PRN
  Administered 2023-04-03: 10 mL

## 2023-04-03 MED ORDER — DEXAMETHASONE SODIUM PHOSPHATE 10 MG/ML IJ SOLN
INTRAMUSCULAR | Status: DC | PRN
  Administered 2023-04-03: 10 mg via INTRAVENOUS

## 2023-04-03 MED ORDER — SILVER SULFADIAZINE 1 % EX CREA
TOPICAL_CREAM | CUTANEOUS | Status: AC
  Filled 2023-04-03: qty 50

## 2023-04-03 MED ORDER — SODIUM CHLORIDE 0.9 % IV SOLN: INTRAVENOUS | Status: DC

## 2023-04-03 MED ORDER — SILVER NITRATE-POT NITRATE 75-25 % EX MISC
CUTANEOUS | Status: AC
  Filled 2023-04-03: qty 10

## 2023-04-03 MED ORDER — DEXAMETHASONE SODIUM PHOSPHATE 10 MG/ML IJ SOLN: 4.0000 mg | INTRAMUSCULAR | Status: DC

## 2023-04-03 MED ORDER — MIDAZOLAM HCL 2 MG/2ML IJ SOLN
INTRAMUSCULAR | Status: AC
Start: 2023-04-03 — End: ?
  Filled 2023-04-03: qty 2

## 2023-04-03 MED ORDER — IODINE STRONG (LUGOLS) 5 % PO SOLN
ORAL | Status: AC
Start: 2023-04-03 — End: ?
  Filled 2023-04-03: qty 1

## 2023-04-03 MED ORDER — PROPOFOL 10 MG/ML IV BOLUS
INTRAVENOUS | Status: DC | PRN
  Administered 2023-04-03: 50 mg via INTRAVENOUS
  Administered 2023-04-03: 150 mg via INTRAVENOUS
  Administered 2023-04-03: 30 mg via INTRAVENOUS
  Administered 2023-04-03: 20 mg via INTRAVENOUS

## 2023-04-03 MED ORDER — FENTANYL CITRATE (PF) 100 MCG/2ML IJ SOLN
INTRAMUSCULAR | Status: AC
  Filled 2023-04-03: qty 2

## 2023-04-03 MED ORDER — ACETIC ACID 5 % SOLN
Status: DC | PRN
  Administered 2023-04-03: 1 via TOPICAL

## 2023-04-03 SURGICAL SUPPLY — 28 items
BLADE SURG 15 STRL LF DISP TIS (BLADE) IMPLANT
CNTNR URN SCR LID CUP LEK RST (MISCELLANEOUS) IMPLANT
DEPRESSOR TONGUE 6 IN STERILE (GAUZE/BANDAGES/DRESSINGS) ×1 IMPLANT
DRAPE UNDERBUTTOCKS STRL (DISPOSABLE) IMPLANT
DRSG TELFA 3X8 NADH STRL (GAUZE/BANDAGES/DRESSINGS) IMPLANT
ELECT BALL LEEP 3MM BLK (ELECTRODE) IMPLANT
GLOVE BIO SURGEON STRL SZ 6 (GLOVE) ×1 IMPLANT
GOWN STRL REUS W/ TWL LRG LVL3 (GOWN DISPOSABLE) ×1 IMPLANT
NDL HYPO 22X1.5 SAFETY MO (MISCELLANEOUS) IMPLANT
NDL SPNL 22GX7 QUINCKE BK (NEEDLE) IMPLANT
NEEDLE HYPO 22X1.5 SAFETY MO (MISCELLANEOUS) ×1
NEEDLE SPNL 22GX7 QUINCKE BK (NEEDLE)
PACK VAGINAL MINOR WOMEN LF (CUSTOM PROCEDURE TRAY) ×1 IMPLANT
PAD PREP 24X48 CUFFED NSTRL (MISCELLANEOUS) ×1 IMPLANT
PUNCH BIOPSY 3 (MISCELLANEOUS) IMPLANT
SCOPETTES 8 STERILE (MISCELLANEOUS) ×2 IMPLANT
SOL PREP POV-IOD 4OZ 10% (MISCELLANEOUS) ×1 IMPLANT
SUT VIC AB 0 CT1 36 (SUTURE) IMPLANT
SUT VIC AB 2-0 SH 27XBRD (SUTURE) IMPLANT
SUT VIC AB 3-0 PS2 18XBRD (SUTURE) IMPLANT
SUT VIC AB 3-0 SH 27X BRD (SUTURE) IMPLANT
SUT VIC AB 4-0 PS2 18 (SUTURE) IMPLANT
SUT VICRYL 0 UR6 27IN ABS (SUTURE) IMPLANT
SYR CONTROL 10ML LL (SYRINGE) ×1 IMPLANT
TOWEL OR 17X24 6PK STRL BLUE (TOWEL DISPOSABLE) IMPLANT
TUBING CONNECTING 10 (TUBING) IMPLANT
VACUUM HOSE 7/8X10 W/ WAND (MISCELLANEOUS) ×1 IMPLANT
WATER STERILE IRR 500ML POUR (IV SOLUTION) ×1 IMPLANT

## 2023-04-03 NOTE — Discharge Instructions (Addendum)
 04/03/2023  Return to work: 4-6 weeks if applicable  You have been prescribed topical lidocaine  you can apply to the lasered areas as needed for discomfort.  Activity: 1. Be up and out of the bed during the day.  Take a nap if needed.  You may walk up steps but be careful and use the hand rail.  Stair climbing will tire you more than you think, you may need to stop part way and rest.   2. No lifting or straining over 10 lbs, pushing, pulling, straining for 1-2 weeks.  3. No driving for at least 24 hours and until the following criteria have been met: Do not drive if you are taking narcotic pain medicine. You need to make sure your reaction time has returned and you can brake safely.  4. Shower daily.  Use your regular soap to bathe and when finished pat your incision dry; don't rub.  No tub baths until cleared by your surgeon.   5. No sexual activity and nothing in the vagina for 4-6 weeks.  6. You may experience a small amount of clear drainage from the lasered area, which is normal.  If the drainage persists or increases, please call the office.  7. You may experience vulvar spotting after surgery.  The spotting is normal but if you experience heavy bleeding, call our office.  8. Take Tylenol  or ibuprofen first for pain and only use narcotic pain medication for severe pain not relieved by the Tylenol  or Ibuprofen.  Monitor your Tylenol  intake to a max of 4,000 mg.   9. You can apply the silvadene  cream as need to the laser area.   Diet: 1. Low sodium Heart Healthy Diet is recommended.  2. It is safe to use a laxative, such as Miralax or Colace, if you have difficulty moving your bowels. You can take Sennakot at bedtime every evening to keep bowel movements regular and to prevent constipation.    Wound Care: 1. Keep clean and dry.  Shower daily.  Reasons to call the Doctor: Fever - Oral temperature greater than 100.4 degrees Fahrenheit Foul-smelling vaginal discharge Difficulty  urinating Nausea and vomiting Increased pain at the site of the incision that is unrelieved with pain medicine. Difficulty breathing with or without chest pain New calf pain especially if only on one side Sudden, continuing increased vaginal bleeding with or without clots.   Contacts: For questions or concerns you should contact:  Dr. Hoy Masters at [phone]  Eleanor Epps, NP at [phone]  After Hours: call [phone] and have the GYN Oncologist paged/contacted  Post Anesthesia Home Care Instructions  Activity: Get plenty of rest for the remainder of the day. A responsible individual must stay with you for 24 hours following the procedure.  For the next 24 hours, DO NOT: -Drive a car -Advertising copywriter -Drink alcoholic beverages -Take any medication unless instructed by your physician -Make any legal decisions or sign important papers.  Meals: Start with liquid foods such as gelatin or soup. Progress to regular foods as tolerated. Avoid greasy, spicy, heavy foods. If nausea and/or vomiting occur, drink only clear liquids until the nausea and/or vomiting subsides. Call your physician if vomiting continues.  Special Instructions/Symptoms: Your throat may feel dry or sore from the anesthesia or the breathing tube placed in your throat during surgery. If this causes discomfort, gargle with warm salt water . The discomfort should disappear within 24 hours.

## 2023-04-03 NOTE — Anesthesia Preprocedure Evaluation (Addendum)
 Anesthesia Evaluation  Patient identified by MRN, date of birth, ID band Patient awake    Reviewed: Allergy & Precautions, H&P , NPO status , Patient's Chart, lab work & pertinent test results  History of Anesthesia Complications (+) PONV and history of anesthetic complications  Airway Mallampati: II  TM Distance: >3 FB Neck ROM: Full    Dental no notable dental hx. (+) Teeth Intact, Dental Advisory Given   Pulmonary former smoker   Pulmonary exam normal breath sounds clear to auscultation       Cardiovascular hypertension, Pt. on medications + Peripheral Vascular Disease   Rhythm:Regular Rate:Normal     Neuro/Psych  Headaches  Anxiety Depression   Dementia CVA, Residual Symptoms    GI/Hepatic Neg liver ROS,GERD  Medicated,,  Endo/Other  diabetes    Renal/GU negative Renal ROS  negative genitourinary   Musculoskeletal  (+) Arthritis ,    Abdominal   Peds  Hematology negative hematology ROS (+)   Anesthesia Other Findings   Reproductive/Obstetrics negative OB ROS                             Anesthesia Physical Anesthesia Plan  ASA: 3  Anesthesia Plan: General   Post-op Pain Management:    Induction: Intravenous  PONV Risk Score and Plan: 4 or greater and Ondansetron , Dexamethasone , Treatment may vary due to age or medical condition, Propofol  infusion and TIVA  Airway Management Planned: LMA  Additional Equipment:   Intra-op Plan:   Post-operative Plan: Extubation in OR  Informed Consent: I have reviewed the patients History and Physical, chart, labs and discussed the procedure including the risks, benefits and alternatives for the proposed anesthesia with the patient or authorized representative who has indicated his/her understanding and acceptance.     Dental advisory given  Plan Discussed with: CRNA  Anesthesia Plan Comments:        Anesthesia Quick  Evaluation

## 2023-04-03 NOTE — Op Note (Signed)
 GYNECOLOGIC ONCOLOGY OPERATIVE NOTE  Date of Service: 04/03/2023  Preoperative Diagnosis: VIN3  Postoperative Diagnosis: Same  Procedures: CO2 laser of the vulva  Surgeon: Hoy Masters, MD  Assistants: None  Anesthesia: General  Estimated Blood Loss: 5 mL    Fluids: 250 ml, crystalloid  Urine Output: none  Findings: Vulva with scar on perineum from prior resection. Approximate 3mm raised lesion on medial right labia just adjacent to perineal scar, prior biopsy site. With acetic acid , aceto white changes of this lesion and few punctate acetowhite changes in midline of posterior forchette and to the left.  Specimens: None  Complications:  None  Indications for Procedure: Deborah Peters is a 68 y.o. 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  Prior to the procedure, all risks, benefits, and alternatives were discussed and informed surgical consent was signed.  Procedure: Patient was taken to the operating room where general anesthesia was achieved.  She was positioned in dorsal lithotomy and prepped and draped.    A thorough exam of the vulva was done after placing a moist sponge with acetic acid  on the vulva with the findings as noted above. Decision was made to proceed with laser ablation.  The area to be removed was outlined to a 5mm margin. Using the CO2 laser, energy was applied with 8 Watts of continuous power.  The ablation was performed down to a second surgical layer.  The wound bed was hemostatic. Silvadene  cream was applied to the wound bed.    Patient tolerated the procedure well. Sponge, lap, and instrument counts were correct.  No perioperative antibiotic prophylaxis was indicated for this procedure.  She was extubated and taken to the PACU in stable condition.  Silvadene  cream was provided postoperatively.  Hoy Masters, MD Gynecologic Oncology

## 2023-04-03 NOTE — Anesthesia Postprocedure Evaluation (Signed)
 Anesthesia Post Note  Patient: Deborah Peters  Procedure(s) Performed: CO2 LASER APPLICATION TO VULVA (Vulva)     Patient location during evaluation: PACU Anesthesia Type: General Level of consciousness: awake and alert Pain management: pain level controlled Vital Signs Assessment: post-procedure vital signs reviewed and stable Respiratory status: spontaneous breathing, nonlabored ventilation and respiratory function stable Cardiovascular status: blood pressure returned to baseline and stable Postop Assessment: no apparent nausea or vomiting Anesthetic complications: no  No notable events documented.  Last Vitals:  Vitals:   04/03/23 1141 04/03/23 1200  BP: 116/77   Pulse:    Resp:    Temp: 36.7 C 36.7 C  SpO2:      Last Pain:  Vitals:   04/03/23 1200  TempSrc:   PainSc: 0-No pain                 Tamon Parkerson,W. EDMOND

## 2023-04-03 NOTE — H&P (Signed)
 Brief Pre-operative History & Physical  Patient name: Deborah Peters CSN: [id number] MRN: [id number] Admit Date: 04/03/2023 Date of Surgery: 04/03/2023 Performing Service: Gynecology   Code Status: Prior    Assessment & Plan    Alinda is a 68 y.o. 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   Consent obtained in office is accurate. Risks, benefits, and alternatives to surgery were reviewed, and all questions were answered.  Proceed to the OR as planned.     History of Present Illness:  Deborah Peters is a 68 y.o. 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 She was recently seen in clinic, where a detailed HPI can be found. She was noted to benefit from: Procedure(s) (LRB): CO2 LASER APPLICATION TO VULVA (N/A).   Past Medical History:  Diagnosis Date   Allergy    Anxiety    Aortic aneurysm (HCC) 04/2022   S/P REPAIR ( 3 stents)   Chronic back pain    Constipation due to opioid therapy    COVID    1st one was severe, no hospitalization, 2nd and 3rd were mild; long covid symptoms from 05/2018   DDD (degenerative disc disease), lumbar    Depression    GERD (gastroesophageal reflux disease)    past hx of    Gestational diabetes    Heart murmur    yrs ago   Hepatitis    age 2, ? 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 gastric ulcer.   Past Surgical History:  Procedure Laterality Date   ABDOMINAL AORTIC ENDOVASCULAR STENT GRAFT N/A 05/12/2022   Procedure: ABDOMINAL AORTIC ENDOVASCULAR STENT GRAFT;   Surgeon: Eliza Lonni RAMAN, MD;  Location: [facility] OR;  Service: Vascular;  Laterality: N/A;   ABDOMINAL HYSTERECTOMY     has her ovaries, heavy periods   BACK SURGERY  2011   L5 S1   CHOLECYSTECTOMY N/A 12/05/2021   Procedure: LAPAROSCOPIC CHOLECYSTECTOMY;  Surgeon: Rubin Calamity, MD;  Location: [facility] OR;  Service: General;  Laterality: N/A;   COLONOSCOPY  2021   JOINT REPLACEMENT Left    L knee   knee surgeries Left    x 7 before replacement   left ear surgery     yrs ago   POLYPECTOMY     HPP in 2008 DB - colon   VULVECTOMY PARTIAL N/A 11/21/2022   Procedure: VULVECTOMY PARTIAL SIMPLE;  Surgeon: Eldonna Mays, MD;  Location: [facility] [place];  Service: Gynecology;  Laterality: N/A;     Allergies Sulfa antibiotics and Simvastatin  Medications   Current Facility-Administered Medications  Medication Dose Route Frequency Provider Last Rate Last Admin   0.9 %  sodium chloride  infusion   Intravenous Continuous Ellender, Bernardino SQUIBB, MD 10 mL/hr at 04/03/23 0946 New Bag at 04/03/23 0946   dexamethasone  (DECADRON ) injection 4 mg  4 mg Intravenous On Call to OR Micheline Eleanor BIRCH, NP        Vital  Signs BP 117/75   Pulse 77   Temp 98.5 F (36.9 C) (Oral)   Resp 18   Ht 5' 5 (1.651 m)   Wt 136 lb 12.8 oz (62.1 kg)   SpO2 100%   BMI 22.76 kg/m  Facility age limit for growth %iles is 20 years. Facility age limit for growth %iles is 20 years..   Physical Exam General: Well developed, appears stated age, in no acute distress  Mental status: Alert and oriented x3 Cardiovascular: Normal Pulmonary: Symmetric chest rise, unlabored breathing Relevant System for Surgery: Surgical site examination deferred to the OR   Labs and Studies: Lab Results  Component Value Date   WBC 8.9 05/13/2022   HGB 13.3 04/03/2023   HCT 39.0 04/03/2023   PLT 175 05/13/2022    Lab Results  Component Value Date   INR 1.2 05/12/2022   APTT 35 05/12/2022   \

## 2023-04-03 NOTE — Transfer of Care (Signed)
 Immediate Anesthesia Transfer of Care Note  Patient: Deborah Peters  Procedure(s) Performed: CO2 LASER APPLICATION TO VULVA (Vulva)  Patient Location: PACU  Anesthesia Type:General  Level of Consciousness: awake, alert , and oriented  Airway & Oxygen  Therapy: Patient Spontanous Breathing  Post-op Assessment: Report given to RN and Post -op Vital signs reviewed and stable  Post vital signs: Reviewed and stable  Last Vitals:  Vitals Value Taken Time  BP    Temp    Pulse 68 04/03/23 1141  Resp 20 04/03/23 1141  SpO2 100 % 04/03/23 1141  Vitals shown include unfiled device data.  Last Pain:  Vitals:   04/03/23 0917  TempSrc: Oral  PainSc: 0-No pain      Patients Stated Pain Goal: 6 (04/03/23 0917)  Complications: No notable events documented.

## 2023-04-03 NOTE — Anesthesia Procedure Notes (Signed)
 Procedure Name: LMA Insertion Date/Time: 04/03/2023 11:02 AM  Performed by: Cena Epps, CRNAPre-anesthesia Checklist: Patient identified, Emergency Drugs available, Suction available and Patient being monitored Patient Re-evaluated:Patient Re-evaluated prior to induction Oxygen  Delivery Method: Circle System Utilized Preoxygenation: Pre-oxygenation with 100% oxygen  Induction Type: IV induction Ventilation: Mask ventilation without difficulty LMA: LMA inserted LMA Size: 4.0 Number of attempts: 1 Airway Equipment and Method: Bite block Placement Confirmation: positive ETCO2 Tube secured with: Tape Dental Injury: Teeth and Oropharynx as per pre-operative assessment

## 2023-04-03 NOTE — Telephone Encounter (Signed)
 Email received from [facility] requesting authorization for Lidocaine 5% ointment,  Requested information sent to plan. Will wait for response.

## 2023-04-04 ENCOUNTER — Telehealth: Payer: Self-pay | Admitting: *Deleted

## 2023-04-04 ENCOUNTER — Encounter (HOSPITAL_BASED_OUTPATIENT_CLINIC_OR_DEPARTMENT_OTHER): Payer: Self-pay | Admitting: Psychiatry

## 2023-04-04 NOTE — Telephone Encounter (Signed)
 Attempted to reach patient for post op call. Left voicemail requesting call back.

## 2023-04-04 NOTE — Telephone Encounter (Signed)
 Spoke with Deborah Peters this morning. She states she is eating, drinking and urinating well. She has not had a BM yet but is passing gas.  Encouraged her to drink plenty of water . She denies fever or chills. Incisions are dry and intact. She rates her pain 5/10. Her pain is controlled with tylenol  today and she took oxycodone  yesterday.     Instructed to call office with any fever, chills, purulent drainage, uncontrolled pain or any other questions or concerns. Patient verbalizes understanding.   Pt aware of post op appointments as well as the office number [phone] and after hours number [phone] to call if she has any questions or concerns

## 2023-04-06 NOTE — Telephone Encounter (Signed)
 Follow up with CoverMyMeds on Lidocaine 5%. As of right now no determination has been made from insurance.   Message sent to Warner Mccreedy NP, requesting advice if patient still needs medication and other options for medication treatment

## 2023-04-06 NOTE — Telephone Encounter (Signed)
 LVM for patient to call office regarding a prior authorization request we received on Lidocaine gel

## 2023-04-06 NOTE — Telephone Encounter (Signed)
 Deborah Peters returned call stating she does not need the Lidocaine any longer. She was thankful our office had been working on the PA through [employer].

## 2023-04-11 ENCOUNTER — Ambulatory Visit: Payer: PPO | Admitting: Physician Assistant

## 2023-04-13 ENCOUNTER — Telehealth: Payer: Self-pay | Admitting: Surgery

## 2023-04-13 NOTE — Telephone Encounter (Signed)
Spoke with patient regarding denial of Lidocaine from Medicare. Advised patient that she may not need it anymore since she is further out from surgery, but that if she does need it she can come to the office and pick up some free of charge. Patient stated she doesn't need it but was grateful for the information should that change. No other concerns at this time.

## 2023-04-13 NOTE — Telephone Encounter (Signed)
-----   Message from Doylene Bode sent at 04/13/2023  1:17 PM EST ----- We received a denial from medicare for topical lidocaine. She is farther out from surgery and most likely does not need this. If she feels she could benefit, she can come and pick up some lidocaine gel from our office no charge. We are going to disregard the denial from Medicare at this time.

## 2023-04-13 NOTE — Telephone Encounter (Signed)
LVM for pt to call back.

## 2023-05-07 ENCOUNTER — Telehealth: Payer: Self-pay

## 2023-05-07 ENCOUNTER — Inpatient Hospital Stay: Payer: PPO | Admitting: Psychiatry

## 2023-05-07 DIAGNOSIS — D071 Carcinoma in situ of vulva: Secondary | ICD-10-CM

## 2023-05-07 NOTE — Telephone Encounter (Signed)
 Mr.Waites called office this morning stating his wife, Hadlee, woke up with a fever and needs to reschedule her appointment for this afternoon with Dr.Newton.  Appointment has been rescheduled to next available on 2/24.

## 2023-05-08 NOTE — Progress Notes (Signed)
This encounter was created in error - please disregard (patient rescheduled).

## 2023-05-11 DIAGNOSIS — I2510 Atherosclerotic heart disease of native coronary artery without angina pectoris: Secondary | ICD-10-CM | POA: Diagnosis not present

## 2023-05-11 DIAGNOSIS — K219 Gastro-esophageal reflux disease without esophagitis: Secondary | ICD-10-CM | POA: Diagnosis not present

## 2023-05-11 DIAGNOSIS — R110 Nausea: Secondary | ICD-10-CM | POA: Diagnosis not present

## 2023-05-11 DIAGNOSIS — J209 Acute bronchitis, unspecified: Secondary | ICD-10-CM | POA: Diagnosis not present

## 2023-05-11 DIAGNOSIS — J019 Acute sinusitis, unspecified: Secondary | ICD-10-CM | POA: Diagnosis not present

## 2023-05-11 DIAGNOSIS — R051 Acute cough: Secondary | ICD-10-CM | POA: Diagnosis not present

## 2023-05-11 DIAGNOSIS — E1169 Type 2 diabetes mellitus with other specified complication: Secondary | ICD-10-CM | POA: Diagnosis not present

## 2023-05-21 ENCOUNTER — Telehealth: Payer: Self-pay

## 2023-05-21 ENCOUNTER — Inpatient Hospital Stay: Payer: PPO | Admitting: Psychiatry

## 2023-05-21 DIAGNOSIS — D071 Carcinoma in situ of vulva: Secondary | ICD-10-CM

## 2023-05-21 NOTE — Telephone Encounter (Signed)
 Deborah Peters called stating she needs to reschedule her appointment for today with Dr.Newton. She reports having bronchitis and has a fever.   Pt rescheduled to 3/10@ 3:15. She agrees to date/time

## 2023-05-23 NOTE — Progress Notes (Signed)
 This encounter was created in error - please disregard (patient rescheduled).

## 2023-06-04 ENCOUNTER — Telehealth: Payer: Self-pay | Admitting: *Deleted

## 2023-06-04 ENCOUNTER — Inpatient Hospital Stay: Payer: PPO | Admitting: Psychiatry

## 2023-06-04 DIAGNOSIS — D071 Carcinoma in situ of vulva: Secondary | ICD-10-CM

## 2023-06-04 NOTE — Telephone Encounter (Signed)
 Received a call from patient's spouse who left a message for the office stating that his wife Deborah Peters will not be able to make her appt. Today, 3/11 at 3:15 and would call the office back to reschedule.   Patient was called back and message left requesting call back to [phone] reschedule appt.

## 2023-06-05 NOTE — Telephone Encounter (Signed)
 Deborah Peters called the office to reschedule her appointment from yesterday. She has been rescheduled to 3/31 @ 3:45. Pt agrees to date/time

## 2023-06-05 NOTE — Progress Notes (Signed)
 This encounter was created in error - please disregard (pt cancelled, rescheduled).

## 2023-06-11 ENCOUNTER — Other Ambulatory Visit: Payer: Self-pay | Admitting: Internal Medicine

## 2023-06-11 DIAGNOSIS — R911 Solitary pulmonary nodule: Secondary | ICD-10-CM

## 2023-06-21 ENCOUNTER — Encounter: Payer: Self-pay | Admitting: Gastroenterology

## 2023-06-25 ENCOUNTER — Inpatient Hospital Stay: Attending: Psychiatry | Admitting: Psychiatry

## 2023-06-25 VITALS — BP 118/71 | HR 70 | Temp 98.0°F | Resp 19 | Wt 139.2 lb

## 2023-06-25 DIAGNOSIS — L290 Pruritus ani: Secondary | ICD-10-CM | POA: Diagnosis not present

## 2023-06-25 DIAGNOSIS — Z9079 Acquired absence of other genital organ(s): Secondary | ICD-10-CM | POA: Diagnosis not present

## 2023-06-25 DIAGNOSIS — D071 Carcinoma in situ of vulva: Secondary | ICD-10-CM | POA: Diagnosis not present

## 2023-06-25 DIAGNOSIS — Z9889 Other specified postprocedural states: Secondary | ICD-10-CM | POA: Insufficient documentation

## 2023-06-25 MED ORDER — HYDROCORTISONE 1 % EX OINT: 1.0000 | TOPICAL_OINTMENT | Freq: Every day | CUTANEOUS | 1 refills | Status: AC

## 2023-06-25 NOTE — Progress Notes (Signed)
 Gynecologic Oncology Return Clinic Visit  Date of Service: 06/25/2023 Referring Provider: Arlee Lace, MD   Assessment & Plan: Deborah Peters is a 68 y.o. 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 Also now with rectal irritation/itching.  Postop: - Pt recovering well from surgery and healing appropriately postoperatively - Intraoperative findings reviewed.  VIN3: - Well healed on exam. - No new areas of concern for lesions. - Given rectal itching and irritation of posterior vulva, Rx for hydrocortisone ointment - Reviewed signs/symptoms of recurrence.  - Surveillance reviewed. Follow-up q6 months x2 years then annually. Will follow-up sooner initially given itching.  Rectal itching: - Given history of vulva dysplasia, pt may be at risk of anal dysplasia - Given itching and whitening around anus, recommend referral to colorectal surgery for further evaluation. - Hydrocortisone cream as above.  RTC 1-61mo.  Derrel Flies, MD Gynecologic Oncology   Medical Decision Making I personally spent  TOTAL 30 minutes face-to-face and non-face-to-face in the care of this patient, which includes all pre, intra, and post visit time on the date of service. \   ----------------------- Reason for Visit: Postop/Treatment discussion  Treatment History: Patient presented to her OB/GYN on 12/01/2021 for occasional vulvar itching around the labia. She had previously reported a history of VIN status post excision in 1996. She returned on 08/08/2022 vulvar biopsy. Biopsy was obtained of the right labia majora which returned with VIN 2.  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 Vulvar biopsy 02/05/23: VIN3 04/03/23 CO2 laser of the vulva  Interval History: Pt reports that she is recovering well from surgery. She is eating and drinking well. She is voiding without issue and having regular bowel movements. She had COVID and flu and is finally  recovering from that. Has gained back some weight that she lost while sick.  She does note that she is having a lot of itching around her rectum.  No new vulvar lesions or bleeding.  Past Medical/Surgical History: Past Medical History:  Diagnosis Date   Allergy    Anxiety    Aortic aneurysm (HCC) 04/2022   S/P REPAIR ( 3 stents)   Chronic back pain    Constipation due to opioid therapy    COVID    1st one was severe, no hospitalization, 2nd and 3rd were mild; long covid symptoms from 05/2018   DDD (degenerative disc disease), lumbar    Depression    GERD (gastroesophageal reflux disease)    past hx of    Gestational diabetes    Heart murmur    yrs ago   Hepatitis    age 60, ? 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 gastric ulcer.    Past Surgical History:  Procedure Laterality Date   ABDOMINAL AORTIC ENDOVASCULAR STENT GRAFT N/A 05/12/2022   Procedure: ABDOMINAL AORTIC ENDOVASCULAR STENT GRAFT;  Surgeon: Dannis Dy, MD;  Location: [facility] OR;  Service: Vascular;  Laterality: N/A;  ABDOMINAL HYSTERECTOMY     has her ovaries, heavy periods   BACK SURGERY  2011   L5 S1   CHOLECYSTECTOMY N/A 12/05/2021   Procedure: LAPAROSCOPIC CHOLECYSTECTOMY;  Surgeon: Shela Derby, MD;  Location: [facility] OR;  Service: General;  Laterality: N/A;   CO2 LASER APPLICATION N/A 04/03/2023   Procedure: CO2 LASER APPLICATION TO VULVA;  Surgeon: Derrel Flies, MD;  Location: [facility] [place];  Service: Gynecology;  Laterality: N/A;   COLONOSCOPY  2021   JOINT REPLACEMENT Left    L knee   knee  surgeries Left    x 7 before replacement   left ear surgery     yrs ago   POLYPECTOMY     HPP in 2008 DB - colon   VULVECTOMY PARTIAL N/A 11/21/2022   Procedure: VULVECTOMY PARTIAL SIMPLE;  Surgeon: Derrel Flies, MD;  Location: [facility] [place];  Service: Gynecology;  Laterality: N/A;    Family History  Problem Relation Age of Onset   Stroke Mother    Heart failure Father        Triple bypass   Melanoma Father    Colon polyps Sister    Colon polyps Brother    Cancer Maternal Grandmother        unknown type of cancer   Cancer Maternal Grandfather        unknown type of cancer   Stomach cancer Paternal Grandfather    Esophageal cancer Neg Hx    Rectal cancer Neg Hx    Colon cancer Neg Hx    Pancreatic cancer Neg Hx    Breast cancer Neg Hx    Ovarian cancer Neg Hx    Endometrial cancer Neg Hx    Prostate cancer Neg Hx     Social History   Socioeconomic History   Marital status: Married    Spouse name: Not on file   Number of children: 2   Years of education: Not on file   Highest education level: Not on file  Occupational History   Occupation: disabled  Tobacco Use   Smoking status: Former    Current packs/day: 0.00    Average packs/day: 1 pack/day for 18.0 years (18.0 ttl pk-yrs)    Types: Cigarettes    Start date: 57    Quit date: 17    Years since quitting: 34.2    Passive exposure: Current   Smokeless tobacco: Never   Tobacco comments:    Occasional cigarette since quitting when younger  Vaping Use   Vaping status: Never Used  Substance and Sexual Activity   Alcohol use: Not Currently    Comment: Rare   Drug use: No   Sexual activity: Yes  Other Topics Concern   Not on file  Social History Narrative   Not on file   Social Drivers of Health   Financial Resource Strain: Not on file  Food Insecurity: Not on file  Transportation Needs: Not on file  Physical Activity: Not on file  Stress: Not on file  Social Connections: Unknown  (08/05/2021)   Received from [facility]   Social Network    Social Network: Not on file    Current Medications:  Current Outpatient Medications:    acetaminophen (TYLENOL) 500 MG tablet, Take 500 mg by mouth every 6 (six) hours as needed for moderate pain (pain score 4-6)., Disp: , Rfl:    Ascorbic Acid (VITAMIN C) 1000 MG tablet, Take 500 mg by mouth daily., Disp: , Rfl:  b complex vitamins capsule, Take 1 capsule by mouth daily. Raw  B complex, Disp: , Rfl:    benzonatate (TESSALON) 100 MG capsule, Take 100 mg by mouth 3 (three) times daily as needed for cough., Disp: , Rfl:    cetirizine (ZYRTEC) 10 MG tablet, Take 10 mg by mouth daily., Disp: , Rfl:    CINNAMON PO, Take 2 capsules by mouth daily., Disp: , Rfl:    clopidogrel (PLAVIX) 75 MG tablet, Take 75 mg by mouth daily., Disp: , Rfl:    Coenzyme Q10 (CO Q-10) 100 MG CAPS, Take 100 mg by mouth daily., Disp: , Rfl:    diazepam (VALIUM) 5 MG tablet, Take 5 mg by mouth 3 (three) times daily as needed for muscle spasms., Disp: , Rfl:    donepezil (ARICEPT) 5 MG tablet, Take 5 mg by mouth at bedtime., Disp: , Rfl:    DULoxetine (CYMBALTA) 60 MG capsule, Take 60 mg by mouth daily., Disp: , Rfl:    ezetimibe (ZETIA) 10 MG tablet, Take 10 mg by mouth daily., Disp: , Rfl: 3   gabapentin (NEURONTIN) 400 MG capsule, Take 400-800 mg by mouth See admin instructions. Take 400 mg in the morning and and afternoon and 800 mg at bedtidme, Disp: , Rfl:    loperamide (IMODIUM A-D) 2 MG tablet, Take 4 mg by mouth 2 (two) times daily as needed for diarrhea or loose stools., Disp: , Rfl:    losartan (COZAAR) 25 MG tablet, Take 50 mg by mouth every evening., Disp: , Rfl:    Magnesium 250 MG TABS, Take 250 mg by mouth at bedtime., Disp: , Rfl:    omega-3 acid ethyl esters (LOVAZA) 1 g capsule, Take by mouth daily., Disp: , Rfl:    OVER THE COUNTER MEDICATION, Take 1 tablet by mouth at bedtime as needed (sleep). alteril natural sleep aid,  Disp: , Rfl:    promethazine (PHENERGAN) 25 MG tablet, Take 25 mg by mouth 2 (two) times daily as needed for nausea or vomiting., Disp: , Rfl:    triamcinolone (NASACORT) 55 MCG/ACT AERO nasal inhaler, Place 2 sprays into the nose daily., Disp: , Rfl:    TURMERIC PO, Take 1 tablet by mouth daily., Disp: , Rfl:    vitamin D3 (CHOLECALCIFEROL) 25 MCG tablet, Take 1,000 Units by mouth daily., Disp: , Rfl:    zolpidem (AMBIEN CR) 6.25 MG CR tablet, Take 6.25 mg by mouth at bedtime as needed for sleep., Disp: , Rfl: 5   atorvastatin (LIPITOR) 80 MG tablet, Take 1 tablet (80 mg total) by mouth at bedtime., Disp: 30 tablet, Rfl: 1   lidocaine (XYLOCAINE) 5 % ointment, Apply 1 Application topically 3 (three) times daily as needed for moderate pain (pain score 4-6). Apply to lasered area on vulva as needed for discomfort (Patient not taking: Reported on 06/21/2023), Disp: 35.44 g, Rfl: 1   Omega-3 Fatty Acids (FISH OIL) 1200 MG CAPS, Take 2,400 mg by mouth 2 (two) times daily. (Patient not taking: Reported on 06/21/2023), Disp: , Rfl:   Review of Symptoms: Complete 10-system review is positive for: abdominal pain, itch  Physical Exam: BP 118/71 (BP Location: Left Arm, Patient Position: Sitting)   Pulse 70   Temp 98 F (36.7 C) (Oral)   Resp 19   Wt 139 lb 3.2 oz (63.1 kg)   SpO2 100%   BMI 23.16 kg/m  General: Alert, oriented, no acute distress. HEENT: Normocephalic, atraumatic. Neck symmetric without masses. Sclera anicteric.  Chest: Normal  work of breathing. Clear to auscultation bilaterally.   Cardiovascular: Regular rate and rhythm, no murmurs. Abdomen: Soft, nontender.  Normoactive bowel sounds.   Extremities: Grossly normal range of motion.  Warm, well perfused.  No edema bilaterally. Skin: No rashes or lesions noted. GU: External genitalia well-healed from prior laser.  No areas concerning on the vulva for active dysplasia at this time.  Areas of whitening around the rectum that could  suggest dysplasia.  Areas of irritation and erythema of the bilateral posterior labia.  Exam chaperoned by Kimberly [place]az[place], CMA   Laboratory & Radiologic Studies: none

## 2023-06-25 NOTE — Patient Instructions (Signed)
 Dr. Alvester Morin has sent in a topical steroid cream to the vulva for your symptoms.  Avoid soaps and other irritants on the vulva. Use the peri bottle and pat the vulva dry after toileting.   Plan on following up in 1-2 months or sooner if needed.   Please call for any needs or concerns.

## 2023-06-29 ENCOUNTER — Other Ambulatory Visit

## 2023-07-05 ENCOUNTER — Other Ambulatory Visit

## 2023-07-09 ENCOUNTER — Encounter: Payer: Self-pay | Admitting: Psychiatry

## 2023-07-10 ENCOUNTER — Telehealth: Payer: Self-pay | Admitting: *Deleted

## 2023-07-10 NOTE — Telephone Encounter (Signed)
 Referral to [facility] faxed with Dr. Ona Bidding progress note from 3/31 with requested providers for Colorectal referral. Faxed [phone].

## 2023-07-10 NOTE — Telephone Encounter (Signed)
-----   Message from Suellyn Emory sent at 06/26/2023 11:03 AM EDT ----- Referral to [facility] for her. Note is not done so would wait to fax for referral reason purposes until note done.

## 2023-07-12 ENCOUNTER — Telehealth: Payer: Self-pay

## 2023-07-12 NOTE — Telephone Encounter (Signed)
 Deborah Peters from [facility] states they received a referral from Dr.Newton. Pt is scheduled for a consult with Dr.Thomas on Monday 5/12 @ 10:50.   Message sent to Dr.Newton

## 2023-07-25 ENCOUNTER — Other Ambulatory Visit: Payer: Self-pay

## 2023-07-25 DIAGNOSIS — I714 Abdominal aortic aneurysm, without rupture, unspecified: Secondary | ICD-10-CM

## 2023-07-26 DIAGNOSIS — R110 Nausea: Secondary | ICD-10-CM | POA: Diagnosis not present

## 2023-07-26 DIAGNOSIS — R197 Diarrhea, unspecified: Secondary | ICD-10-CM | POA: Diagnosis not present

## 2023-07-26 DIAGNOSIS — F329 Major depressive disorder, single episode, unspecified: Secondary | ICD-10-CM | POA: Diagnosis not present

## 2023-07-26 DIAGNOSIS — E1169 Type 2 diabetes mellitus with other specified complication: Secondary | ICD-10-CM | POA: Diagnosis not present

## 2023-07-26 DIAGNOSIS — R413 Other amnesia: Secondary | ICD-10-CM | POA: Diagnosis not present

## 2023-07-26 DIAGNOSIS — E785 Hyperlipidemia, unspecified: Secondary | ICD-10-CM | POA: Diagnosis not present

## 2023-07-26 DIAGNOSIS — U099 Post covid-19 condition, unspecified: Secondary | ICD-10-CM | POA: Diagnosis not present

## 2023-07-26 DIAGNOSIS — I10 Essential (primary) hypertension: Secondary | ICD-10-CM | POA: Diagnosis not present

## 2023-07-26 DIAGNOSIS — M51362 Other intervertebral disc degeneration, lumbar region with discogenic back pain and lower extremity pain: Secondary | ICD-10-CM | POA: Diagnosis not present

## 2023-07-26 DIAGNOSIS — K219 Gastro-esophageal reflux disease without esophagitis: Secondary | ICD-10-CM | POA: Diagnosis not present

## 2023-07-26 DIAGNOSIS — G4700 Insomnia, unspecified: Secondary | ICD-10-CM | POA: Diagnosis not present

## 2023-07-26 DIAGNOSIS — G8929 Other chronic pain: Secondary | ICD-10-CM | POA: Diagnosis not present

## 2023-07-26 DIAGNOSIS — R911 Solitary pulmonary nodule: Secondary | ICD-10-CM | POA: Diagnosis not present

## 2023-07-26 DIAGNOSIS — I2510 Atherosclerotic heart disease of native coronary artery without angina pectoris: Secondary | ICD-10-CM | POA: Diagnosis not present

## 2023-07-26 LAB — HEMOGLOBIN A1C: A1c: 6

## 2023-07-26 LAB — TSH: TSH: 1.87 (ref 0.41–5.90)

## 2023-07-26 LAB — LAB REPORT - SCANNED: EGFR: 62.3

## 2023-07-30 ENCOUNTER — Ambulatory Visit: Payer: PPO | Admitting: Surgery

## 2023-07-30 ENCOUNTER — Encounter: Payer: Self-pay | Admitting: Surgery

## 2023-07-30 ENCOUNTER — Ambulatory Visit (HOSPITAL_COMMUNITY)
Admission: RE | Admit: 2023-07-30 | Discharge: 2023-07-30 | Disposition: A | Discharge status: Home / Self Care | Payer: PPO | Source: Ambulatory Visit | Attending: Surgery | Admitting: Surgery

## 2023-07-30 VITALS — BP 133/69 | HR 81 | Temp 98.2°F | Resp 20 | Ht 65.0 in | Wt 134.0 lb

## 2023-07-30 DIAGNOSIS — I7143 Infrarenal abdominal aortic aneurysm, without rupture: Secondary | ICD-10-CM | POA: Diagnosis not present

## 2023-07-30 DIAGNOSIS — I714 Abdominal aortic aneurysm, without rupture, unspecified: Secondary | ICD-10-CM | POA: Insufficient documentation

## 2023-07-30 NOTE — Progress Notes (Signed)
 [facility] [place]  Patient name: Deborah Peters MRN: [id number] DOB: Jun 04, 1955 Sex: female   REASON FOR VISIT:    Follow up  HISOTRY OF PRESENT ILLNESS:    Deborah Peters is a 68 y.o. female who is status post endovascular pair of 2 saccular aneurysms in her aorta on 05/12/2022 by Dr. Shaunna Delaware.  Her postoperative CT scan showed good position of the stent graft with no evidence of endoleak.  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  The patient takes a statin for hypercholesterolemia.  She is medically managed for hypertension with an ARB.  She does have a history of stroke with right upper extremity deficit.  She is on Plavix .  She is a former smoker.   PAST MEDICAL HISTORY:   Past Medical History:  Diagnosis Date   Allergy    Anxiety    Aortic aneurysm (HCC) 04/2022   S/P REPAIR ( 3 stents)   Chronic back pain    Constipation due to opioid therapy    COVID    1st one was severe, no hospitalization, 2nd and 3rd were mild; long covid symptoms from 05/2018   DDD (degenerative disc disease), lumbar    Depression    GERD (gastroesophageal reflux disease)    past hx of    Gestational diabetes    Heart murmur    yrs ago   Hepatitis    age 60, ? 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 gastric ulcer.     FAMILY HISTORY:   Family History  Problem Relation Age of Onset   Stroke Mother    Heart failure Father        Triple bypass   Melanoma Father    Colon polyps Sister    Colon polyps  Brother    Cancer Maternal Grandmother        unknown type of cancer   Cancer Maternal Grandfather        unknown type of cancer   Stomach cancer Paternal Grandfather    Esophageal cancer Neg Hx    Rectal cancer Neg Hx    Colon cancer Neg Hx    Pancreatic cancer Neg Hx    Breast cancer Neg Hx    Ovarian cancer Neg Hx    Endometrial cancer Neg Hx    Prostate cancer Neg Hx     SOCIAL HISTORY:   Social History   Tobacco Use   Smoking status: Former    Current packs/day: 0.00    Average packs/day: 1 pack/day for 18.0 years (18.0 ttl pk-yrs)    Types: Cigarettes    Start date: 33    Quit date: 1991    Years since quitting: 34.3    Passive exposure: Current   Smokeless tobacco: Never   Tobacco comments:    Occasional cigarette since quitting when younger  Substance Use Topics   Alcohol  use: Not Currently  Comment: Rare     ALLERGIES:   Allergies  Allergen Reactions   Sulfa Antibiotics Anaphylaxis   Simvastatin     felt terrible     CURRENT MEDICATIONS:   Current Outpatient Medications  Medication Sig Dispense Refill   acetaminophen  (TYLENOL ) 500 MG tablet Take 500 mg by mouth every 6 (six) hours as needed for moderate pain (pain score 4-6).     Ascorbic Acid (VITAMIN C) 1000 MG tablet Take 500 mg by mouth daily.     b complex vitamins capsule Take 1 capsule by mouth daily. Raw  B complex     benzonatate  (TESSALON ) 100 MG capsule Take 100 mg by mouth 3 (three) times daily as needed for cough.     cetirizine (ZYRTEC) 10 MG tablet Take 10 mg by mouth daily.     CINNAMON PO Take 2 capsules by mouth daily.     clopidogrel  (PLAVIX ) 75 MG tablet Take 75 mg by mouth daily.     Coenzyme Q10 (CO Q-10) 100 MG CAPS Take 100 mg by mouth daily.     diazepam  (VALIUM ) 5 MG tablet Take 5 mg by mouth 3 (three) times daily as needed for muscle spasms.     donepezil  (ARICEPT ) 5 MG tablet Take 5 mg by mouth at bedtime.     DULoxetine  (CYMBALTA ) 60 MG capsule Take 60 mg by mouth  daily.     ezetimibe  (ZETIA ) 10 MG tablet Take 10 mg by mouth daily.  3   gabapentin  (NEURONTIN ) 400 MG capsule Take 400-800 mg by mouth See admin instructions. Take 400 mg in the morning and and afternoon and 800 mg at bedtidme     hydrocortisone  1 % ointment Apply 1 Application topically daily. 28 g 1   lidocaine  (XYLOCAINE ) 5 % ointment Apply 1 Application topically 3 (three) times daily as needed for moderate pain (pain score 4-6). Apply to lasered area on vulva as needed for discomfort 35.44 g 1   loperamide (IMODIUM A-D) 2 MG tablet Take 4 mg by mouth 2 (two) times daily as needed for diarrhea or loose stools.     losartan  (COZAAR ) 25 MG tablet Take 50 mg by mouth every evening.     Magnesium  250 MG TABS Take 250 mg by mouth at bedtime.     omega-3 acid ethyl esters (LOVAZA) 1 g capsule Take by mouth daily.     Omega-3 Fatty Acids (FISH OIL) 1200 MG CAPS Take 2,400 mg by mouth 2 (two) times daily.     OVER THE COUNTER MEDICATION Take 1 tablet by mouth at bedtime as needed (sleep). alteril natural sleep aid     promethazine  (PHENERGAN ) 25 MG tablet Take 25 mg by mouth 2 (two) times daily as needed for nausea or vomiting.     triamcinolone  (NASACORT ) 55 MCG/ACT AERO nasal inhaler Place 2 sprays into the nose daily.     TURMERIC PO Take 1 tablet by mouth daily.     vitamin D3 (CHOLECALCIFEROL) 25 MCG tablet Take 1,000 Units by mouth daily.     zolpidem  (AMBIEN  CR) 6.25 MG CR tablet Take 6.25 mg by mouth at bedtime as needed for sleep.  5   atorvastatin  (LIPITOR ) 80 MG tablet Take 1 tablet (80 mg total) by mouth at bedtime. 30 tablet 1   No current facility-administered medications for this visit.    REVIEW OF SYSTEMS:   [X]  denotes positive finding, [ ]  denotes negative finding Cardiac  Comments:  Chest pain or chest pressure:  Shortness of breath upon exertion:    Short of breath when lying flat:    Irregular heart rhythm:        Vascular    Pain in calf, thigh, or hip brought on  by ambulation:    Pain in feet at night that wakes you up from your sleep:     Blood clot in your veins:    Leg swelling:         Pulmonary    Oxygen  at home:    Productive cough:     Wheezing:         Neurologic    Sudden weakness in arms or legs:     Sudden numbness in arms or legs:     Sudden onset of difficulty speaking or slurred speech:    Temporary loss of vision in one eye:     Problems with dizziness:         Gastrointestinal    Blood in stool:     Vomited blood:         Genitourinary    Burning when urinating:     Blood in urine:        Psychiatric    Major depression:         Hematologic    Bleeding problems:    Problems with blood clotting too easily:        Skin    Rashes or ulcers:        Constitutional    Fever or chills:      PHYSICAL EXAM:   Vitals:   07/30/23 0847  BP: 133/69  Pulse: 81  Resp: 20  Temp: 98.2 F (36.8 C)  SpO2: 99%  Weight: 134 lb (60.8 kg)  Height: 5\' 5"  (1.651 m)    GENERAL: The patient is a well-nourished female, in no acute distress. The vital signs are documented above. CARDIAC: There is a regular rate and rhythm.  VASCULAR: palpable PT pulses PULMONARY: Non-labored respirations ABDOMEN: Soft and non-tender with normal pitched bowel sounds.  MUSCULOSKELETAL: There are no major deformities or cyanosis. NEUROLOGIC: No focal weakness or paresthesias are detected. SKIN: There are no ulcers or rashes noted. PSYCHIATRIC: The patient has a normal affect.  STUDIES:   I have reviewed her ultrasound with the following findings: Abdominal Aorta: Patent endovascular aneurysm repair with no evidence of  endoleak.  Maximal aortic diam was 2.5 cm  MEDICAL ISSUES:   AAA: Ultrasound today shows no complications from repair and no endoleak.  I will repeat her ultrasound in 1 year.  In about 5 years, she will need a CT scan to evaluate a internal iliac ectasia.  We spoke about her younger brother Maricela Shoe  today.    Marti Slates, MD, FACS [facility] [phone] Pager [phone]

## 2023-08-06 ENCOUNTER — Telehealth: Payer: Self-pay

## 2023-08-06 ENCOUNTER — Inpatient Hospital Stay: Admitting: Psychiatry

## 2023-08-06 DIAGNOSIS — D071 Carcinoma in situ of vulva: Secondary | ICD-10-CM

## 2023-08-06 NOTE — Telephone Encounter (Signed)
 Ms.Cardosa called to reschedule her appointment with Dr.Newton for today 5/12. She states she spiked a fever last night and is having N/V today.   Appointment rescheduled to 6/9 @ 3:00pm. Pt agreed to date/time

## 2023-08-07 NOTE — Progress Notes (Signed)
 This encounter was created in error - please disregard.

## 2023-08-23 ENCOUNTER — Ambulatory Visit: Admitting: Gastroenterology

## 2023-08-24 ENCOUNTER — Encounter: Payer: Self-pay | Admitting: Physician Assistant

## 2023-08-24 ENCOUNTER — Ambulatory Visit: Admitting: Physician Assistant

## 2023-08-24 VITALS — BP 122/80 | HR 80 | Ht 65.0 in | Wt 137.0 lb

## 2023-08-24 DIAGNOSIS — Z9079 Acquired absence of other genital organ(s): Secondary | ICD-10-CM | POA: Diagnosis not present

## 2023-08-24 DIAGNOSIS — R109 Unspecified abdominal pain: Secondary | ICD-10-CM

## 2023-08-24 DIAGNOSIS — R194 Change in bowel habit: Secondary | ICD-10-CM

## 2023-08-24 DIAGNOSIS — R634 Abnormal weight loss: Secondary | ICD-10-CM | POA: Diagnosis not present

## 2023-08-24 DIAGNOSIS — Z87448 Personal history of other diseases of urinary system: Secondary | ICD-10-CM

## 2023-08-24 DIAGNOSIS — R1084 Generalized abdominal pain: Secondary | ICD-10-CM

## 2023-08-24 DIAGNOSIS — N903 Dysplasia of vulva, unspecified: Secondary | ICD-10-CM

## 2023-08-24 NOTE — Patient Instructions (Signed)
 Your provider has requested that you go to the basement level for lab work before leaving today. Press "B" on the elevator. The lab is located at the first door on the left as you exit the elevator.   We will try to obtain your records from Dr Perini's office   Follow up per recommendations after records are reviewed   Due to recent changes in healthcare laws, you may see the results of your imaging and laboratory studies on MyChart before your provider has had a chance to review them.  We understand that in some cases there may be results that are confusing or concerning to you. Not all laboratory results come back in the same time frame and the provider may be waiting for multiple results in order to interpret others.  Please give us  48 hours in order for your provider to thoroughly review all the results before contacting the office for clarification of your results.    I appreciate the  opportunity to care for you  Thank You   Jennifer Lemmon,PA-C

## 2023-08-24 NOTE — Progress Notes (Addendum)
 Chief Complaint: Discuss colonoscopy  HPI:    Deborah Peters is a 68 year old female, known to Dr. Leonia Raman, with a past medical history as listed below including anxiety, aortic aneurysm status postrepair, GERD and stroke on Plavix  (11/08/2020 echo with LVEF 65-70%), who was referred to me by Aldo Hun, MD for question of a colonoscopy.    06/12/16 colonoscopy with Dr. Leonia Raman with five 4-6 mm polyps in the rectum and sigmoid colon and two 1-2 mm polyps in the rectum as well as diverticulosis in the sigmoid colon and in the descending colon.  Pathology showed tubular adenoma.  Repeat recommended in 5 years.    Today, patient presents to clinic and has a constellation of issues.  Unfortunately we do not have any of her prior records from PCP who is outside of the [facility] system.  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  Has an appointment on 16 June for further evaluation of that.  She was urged to get her colonoscopy since she is overdue and now with this new finding of rectal dysplasia.      Has also been experiencing watery loose stools at least 3-4 times a day since the end of October 2023 and things have been getting worse.  Even if she does not eat for a few days she still has water  g[place] also with generalized abdominal pain and lower abdominal cramping.  She is not sure if she has had any stool studies done.  Apparently has had a ton of labs, but we do not have these at time of her appointment.    Associated symptoms include weight loss of over 20 pounds.  Patient tells me that she has had long COVID since 2020 and it has affected all of her body systems.  Prior to this really did not have any issues.    Denies fever, chills, nausea, vomiting or symptoms that awaken her from sleep.      Past Medical History:  Diagnosis Date    Allergy    Anxiety    Aortic aneurysm (HCC) 04/2022   S/P REPAIR ( 3 stents)   Chronic back pain    Constipation due to opioid therapy    COVID    1st one was severe, no hospitalization, 2nd and 3rd were mild; long covid symptoms from 05/2018   DDD (degenerative disc disease), lumbar    Depression    GERD (gastroesophageal reflux disease)    past hx of    Gestational diabetes    Heart murmur    yrs ago   Hepatitis    age 41, ? came from surgery done, no liver problems   Hyperlipidemia    Hypertension    pt this pain related - id occasionally high    IFG (impaired fasting glucose)    Migraines    no longer has migraines   Pneumonia 1994   PONV (postoperative nausea and vomiting)    in 1980's none since   Post-menopausal    Pre-diabetes    Scoliosis    Stroke [facility])    Right Upper Extremity Deficit july 2016 and dec 2021   Ulcer    years ago. gastric ulcer.    Past Surgical History:  Procedure Laterality Date   ABDOMINAL AORTIC ENDOVASCULAR STENT GRAFT N/A 05/12/2022   Procedure: ABDOMINAL AORTIC ENDOVASCULAR  STENT GRAFT;  Surgeon: Dannis Dy, MD;  Location: [facility] OR;  Service: Vascular;  Laterality: N/A;   ABDOMINAL HYSTERECTOMY     has her ovaries, heavy periods   BACK SURGERY  2011   L5 S1   CHOLECYSTECTOMY N/A 12/05/2021   Procedure: LAPAROSCOPIC CHOLECYSTECTOMY;  Surgeon: Shela Derby, MD;  Location: [facility] OR;  Service: General;  Laterality: N/A;   CO2 LASER APPLICATION N/A 04/03/2023   Procedure: CO2 LASER APPLICATION TO VULVA;  Surgeon: Derrel Flies, MD;  Location: [facility];  Service: Gynecology;  Laterality: N/A;   COLONOSCOPY  2021   JOINT REPLACEMENT Left    L knee   knee surgeries Left    x 7 before replacement   left ear surgery     yrs ago   POLYPECTOMY     HPP in 2008 DB - colon   VULVECTOMY PARTIAL N/A 11/21/2022   Procedure: VULVECTOMY PARTIAL SIMPLE;  Surgeon: Derrel Flies, MD;  Location: [facility];  Service: Gynecology;  Laterality: N/A;    Current Outpatient Medications  Medication Sig Dispense Refill   acetaminophen  (TYLENOL ) 500 MG tablet Take 500 mg by mouth every 6 (six) hours as needed for moderate pain (pain score 4-6).     Ascorbic Acid (VITAMIN C) 1000 MG tablet Take 500 mg by mouth daily.     atorvastatin  (LIPITOR ) 80 MG tablet Take 1 tablet (80 mg total) by mouth at bedtime. 30 tablet 1   b complex vitamins capsule Take 1 capsule by mouth daily. Raw  B complex     benzonatate  (TESSALON ) 100 MG capsule Take 100 mg by mouth 3 (three) times daily as needed for cough.     cetirizine (ZYRTEC) 10 MG tablet Take 10 mg by mouth daily.     CINNAMON PO Take 2 capsules by mouth daily.     clopidogrel  (PLAVIX ) 75 MG tablet Take 75 mg by mouth daily.     Coenzyme Q10 (CO Q-10) 100 MG CAPS Take 100 mg by mouth daily.     diazepam  (VALIUM ) 5 MG tablet Take 5 mg by mouth 3 (three) times daily as needed for muscle spasms.     donepezil  (ARICEPT ) 5 MG tablet Take 5 mg by mouth at bedtime.     DULoxetine  (CYMBALTA ) 60 MG capsule Take 60 mg by mouth daily.     ezetimibe  (ZETIA ) 10 MG tablet Take 10 mg by mouth daily.  3   gabapentin  (NEURONTIN ) 400 MG capsule Take 400-800 mg by mouth See admin instructions. Take 400 mg in the morning and and afternoon and 800 mg at bedtidme     hydrocortisone  1 % ointment Apply 1 Application topically daily. 28 g 1   lidocaine  (XYLOCAINE ) 5 % ointment Apply 1 Application topically 3 (three) times daily as needed for moderate pain (pain score 4-6). Apply to lasered area on vulva as needed for discomfort 35.44 g 1   loperamide (IMODIUM A-D) 2 MG tablet Take 4 mg by mouth 2 (two) times daily as needed for diarrhea or loose stools.     losartan  (COZAAR ) 25 MG tablet Take 50 mg by mouth every evening.     Magnesium  250 MG TABS Take 250 mg by mouth at bedtime.     omega-3 acid ethyl esters (LOVAZA) 1 g capsule Take by mouth daily.     Omega-3 Fatty Acids  (FISH OIL) 1200 MG CAPS Take 2,400 mg by mouth 2 (two) times daily.     OVER  THE COUNTER MEDICATION Take 1 tablet by mouth at bedtime as needed (sleep). alteril natural sleep aid     promethazine  (PHENERGAN ) 25 MG tablet Take 25 mg by mouth 2 (two) times daily as needed for nausea or vomiting.     triamcinolone  (NASACORT ) 55 MCG/ACT AERO nasal inhaler Place 2 sprays into the nose daily.     TURMERIC PO Take 1 tablet by mouth daily.     vitamin D3 (CHOLECALCIFEROL) 25 MCG tablet Take 1,000 Units by mouth daily.     zolpidem  (AMBIEN  CR) 6.25 MG CR tablet Take 6.25 mg by mouth at bedtime as needed for sleep.  5   No current facility-administered medications for this visit.    Allergies as of 08/24/2023 - Review Complete 07/30/2023  Allergen Reaction Noted   Sulfa antibiotics Anaphylaxis 05/20/2012   Simvastatin  06/03/2009    Family History  Problem Relation Age of Onset   Stroke Mother    Heart failure Father        Triple bypass   Melanoma Father    Colon polyps Sister    Colon polyps Brother    Cancer Maternal Grandmother        unknown type of cancer   Cancer Maternal Grandfather        unknown type of cancer   Stomach cancer Paternal Grandfather    Esophageal cancer Neg Hx    Rectal cancer Neg Hx    Colon cancer Neg Hx    Pancreatic cancer Neg Hx    Breast cancer Neg Hx    Ovarian cancer Neg Hx    Endometrial cancer Neg Hx    Prostate cancer Neg Hx     Social History   Socioeconomic History   Marital status: Married    Spouse name: Not on file   Number of children: 2   Years of education: Not on file   Highest education level: Not on file  Occupational History   Occupation: disabled  Tobacco Use   Smoking status: Former    Current packs/day: 0.00    Average packs/day: 1 pack/day for 18.0 years (18.0 ttl pk-yrs)    Types: Cigarettes    Start date: 17    Quit date: 1991    Years since quitting: 34.4    Passive exposure: Current   Smokeless tobacco: Never    Tobacco comments:    Occasional cigarette since quitting when younger  Vaping Use   Vaping status: Never Used  Substance and Sexual Activity   Alcohol  use: Not Currently    Comment: Rare   Drug use: No   Sexual activity: Yes  Other Topics Concern   Not on file  Social History Narrative   Not on file   Social Drivers of Health   Financial Resource Strain: Not on file  Food Insecurity: Not on file  Transportation Needs: Not on file  Physical Activity: Not on file  Stress: Not on file  Social Connections: Unknown (08/05/2021)   Received from [facility]   Social Network    Social Network: Not on file  Intimate Partner Violence: Unknown (06/29/2021)   Received from [facility], [facility]   HITS    Physically Hurt: Not on file    Insult or Talk Down To: Not on file    Threaten Physical Harm: Not on file    Scream or Curse: Not on file    Review of Systems:    Constitutional: No fever or chills Skin: No  rash Cardiovascular: No chest pain Respiratory: No SOB  Gastrointestinal: See HPI and otherwise negative Genitourinary: No dysuria Neurological: No headache, dizziness or syncope Musculoskeletal: No new muscle or joint pain Hematologic: No bleeding  Psychiatric: No history of depression or anxiety   Physical Exam:  Vital signs: BP 122/80   Pulse 80   Ht 5' 5 (1.651 m)   Wt 137 lb (62.1 kg)   BMI 22.80 kg/m    Constitutional:   Pleasant elderly Caucasian female appears to be in NAD, Well developed, Well nourished, alert and cooperative Head:  Normocephalic and atraumatic. Eyes:   PEERL, EOMI. No icterus. Conjunctiva pink. Ears:  Normal auditory acuity. Neck:  Supple Throat: Oral cavity and pharynx without inflammation, swelling or lesion.  Respiratory: Respirations even and unlabored. Lungs clear to auscultation bilaterally.   No wheezes, crackles, or rhonchi.  Cardiovascular: Normal S1, S2. No MRG. Regular rate and rhythm. No peripheral  edema, cyanosis or pallor.  Gastrointestinal:  Soft, nondistended, nontender. No rebound or guarding. Normal bowel sounds. No appreciable masses or hepatomegaly. Rectal:  Not performed.  Msk:  Symmetrical without gross deformities. Without edema, no deformity or joint abnormality.  Neurologic:  Alert and  oriented x4;  grossly normal neurologically.  Skin:   Dry and intact without significant lesions or rashes. Psychiatric: Demonstrates good judgement and reason without abnormal affect or behaviors.  RELEVANT LABS AND IMAGING: CBC    Component Value Date/Time   WBC 8.9 05/13/2022 0340   RBC 2.98 (L) 05/13/2022 0340   HGB 13.3 04/03/2023 0944   HCT 39.0 04/03/2023 0944   PLT 175 05/13/2022 0340   MCV 97.3 05/13/2022 0340   MCH 32.2 05/13/2022 0340   MCHC 33.1 05/13/2022 0340   RDW 13.1 05/13/2022 0340   LYMPHSABS 3.7 11/27/2020 0031   MONOABS 0.7 11/27/2020 0031   EOSABS 0.1 11/27/2020 0031   BASOSABS 0.0 11/27/2020 0031    CMP     Component Value Date/Time   NA 138 04/03/2023 0944   NA 144 11/25/2020 1505   K 3.8 04/03/2023 0944   CL 105 04/03/2023 0944   CO2 23 05/13/2022 0340   GLUCOSE 122 (H) 04/03/2023 0944   BUN 6 (L) 04/03/2023 0944   BUN 7 (L) 11/25/2020 1505   CREATININE 0.80 04/03/2023 0944   CALCIUM  8.8 (L) 05/13/2022 0340   PROT 6.9 05/10/2022 1148   PROT 7.0 11/25/2020 1505   ALBUMIN 4.1 05/10/2022 1148   ALBUMIN 4.9 (H) 11/25/2020 1505   AST 20 05/10/2022 1148   ALT 18 05/10/2022 1148   ALKPHOS 59 05/10/2022 1148   BILITOT 0.6 05/10/2022 1148   BILITOT 0.4 11/25/2020 1505   GFRNONAA >60 05/13/2022 0340   GFRAA >60 12/21/2017 1409    Assessment: 1.  Change in bowel habits: towards diarrhea over the past year, uncertain if she has had stool studies, tells me she has had a bunch of labs with her PCP; consider infectious versus inflammatory versus other cause 2.  History of VIN 3 (vulvar intraepithelial neoplasia 3): History of partial vulvectomy,  apparently OB/GYN noticed some abnormal cells in her rectum 2 and she was referred to general surgery for further eval 3.  Abdominal pain: Cramping pain all over her abdomen, worse with diarrhea; consider relation IBS versus other 4.  Weight loss: At least 20 pounds over the past couple of years 5.  Long COVID  Plan: 1.  Patient needs EGD and colonoscopy.  Explained that I would like to get stool studies  first to rule out infectious cause before proceeding with these. 2.  Ordered GI path panel, fecal calprotectin, fecal lactoferrin and O&P today.  When these result we can go ahead and schedule her for procedures. 3.  Patient is slightly irritated that we cannot go ahead and schedule procedures and I told her we need to rule out infectious causes.  This will not take very long, hopefully by the end of next week will have some answers. 4.  Also requesting labs and if any prior stool studies from PCP. 5.  Patient has surgical consult on 6/16 and would like to have procedures done prior to that if possible so they have all the answers. 6.  Patient to follow in clinic per recommendations after stool studies.  Assigned to Dr. Nandigam today.  Reginal Capra, PA-C [facility] 08/24/2023, 9:22 AM  Cc: Aldo Hun, MD   09/10/2023 10:37 AM Addendum  Received records for the patient regarding recent labs  07/26/2023 Vitamin B12 normal, CMP normal, CBC normal, TSH normal, hemoglobin A1c normal  No change in plans Reginal Capra, PA-C

## 2023-08-27 ENCOUNTER — Other Ambulatory Visit

## 2023-08-27 DIAGNOSIS — R634 Abnormal weight loss: Secondary | ICD-10-CM

## 2023-08-27 DIAGNOSIS — R194 Change in bowel habit: Secondary | ICD-10-CM

## 2023-08-27 DIAGNOSIS — R1084 Generalized abdominal pain: Secondary | ICD-10-CM

## 2023-08-29 ENCOUNTER — Telehealth: Payer: Self-pay | Admitting: *Deleted

## 2023-08-29 LAB — CALPROTECTIN, FECAL: Calprotectin, Fecal: 15 ug/g (ref 0–120)

## 2023-08-29 NOTE — Telephone Encounter (Signed)
 On 5/30 Deborah Peters seen this patient in the office and requested her records from Dr Perini's office.  We have received them and I have placed them on her desk

## 2023-09-03 ENCOUNTER — Telehealth: Payer: Self-pay | Admitting: Physician Assistant

## 2023-09-03 ENCOUNTER — Inpatient Hospital Stay: Admitting: Psychiatry

## 2023-09-03 ENCOUNTER — Telehealth: Payer: Self-pay

## 2023-09-03 DIAGNOSIS — D071 Carcinoma in situ of vulva: Secondary | ICD-10-CM

## 2023-09-03 NOTE — Telephone Encounter (Signed)
 Left message for patient that we are in the process of  getting the stool studies resulted before we schedule the procedure. [facility] is the lab

## 2023-09-03 NOTE — Telephone Encounter (Signed)
 Pt called stating she needs to reschedule her appointment for today with Dr.Newton. 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   Pt is rescheduled for 7/21 @ 3:00. She agrees to date/time. I have placed her on the cancellation list to call if anyone cancels before 7/21.

## 2023-09-03 NOTE — Telephone Encounter (Signed)
 Bridgette Campus wanted to wait on patients records, we received them on 5/30. Not sure if Bridgette Campus has seen them. Will forward this message to her

## 2023-09-03 NOTE — Telephone Encounter (Signed)
 Corbin Dess are you waiting for this patient to call you back to set up endo colon-she saw Bridgette Campus in office

## 2023-09-03 NOTE — Telephone Encounter (Signed)
 Pt said she saw Bridgette Campus last week and was told to call back to schedule an endo/colon ASAP.  Patient stated Bridgette Campus told her she could schedule with either provider.  Please advise?

## 2023-09-03 NOTE — Telephone Encounter (Signed)
 Called [facility] and they do not have the patient on file, Called [facility] and they said the patient did not submit the stool for the other test. It shows orders was released but it was done in error by the lab. The manager of the lab is going to contact the patient   Deborah Peters The lab said they had sent you a message ???

## 2023-09-03 NOTE — Progress Notes (Unsigned)
 This encounter was created in error - please disregard.

## 2023-09-04 ENCOUNTER — Other Ambulatory Visit: Payer: Self-pay | Admitting: *Deleted

## 2023-09-04 DIAGNOSIS — R1084 Generalized abdominal pain: Secondary | ICD-10-CM

## 2023-09-04 DIAGNOSIS — R194 Change in bowel habit: Secondary | ICD-10-CM

## 2023-09-04 DIAGNOSIS — K529 Noninfective gastroenteritis and colitis, unspecified: Secondary | ICD-10-CM

## 2023-09-06 ENCOUNTER — Telehealth: Payer: Self-pay

## 2023-09-06 NOTE — Telephone Encounter (Signed)
 Spoke with patient and rescheduled her appointment from 6/9... patient confirmed

## 2023-09-10 ENCOUNTER — Inpatient Hospital Stay: Admitting: Psychiatry

## 2023-09-11 ENCOUNTER — Other Ambulatory Visit: Payer: Self-pay | Admitting: *Deleted

## 2023-09-11 DIAGNOSIS — K529 Noninfective gastroenteritis and colitis, unspecified: Secondary | ICD-10-CM

## 2023-09-11 NOTE — Telephone Encounter (Signed)
 Spoke with the patient and she will be in this week to get the new stool studies to send to [facility]. I will inform Aleda Hurl the manager of the lab that she will be in so the mistake of them being released does not happen this time. Patient was not upset that she has to redo them, and she is still having diarrhea and using Imodium daily

## 2023-09-11 NOTE — Telephone Encounter (Signed)
Inbound call from patient in regards to previous note. Please advise.  Thank you

## 2023-09-13 ENCOUNTER — Ambulatory Visit: Admitting: Gastroenterology

## 2023-09-20 ENCOUNTER — Other Ambulatory Visit

## 2023-09-20 DIAGNOSIS — R194 Change in bowel habit: Secondary | ICD-10-CM | POA: Diagnosis not present

## 2023-09-20 DIAGNOSIS — K529 Noninfective gastroenteritis and colitis, unspecified: Secondary | ICD-10-CM

## 2023-09-20 DIAGNOSIS — R634 Abnormal weight loss: Secondary | ICD-10-CM

## 2023-09-20 DIAGNOSIS — R1084 Generalized abdominal pain: Secondary | ICD-10-CM

## 2023-09-21 LAB — FECAL LACTOFERRIN, QUANT
Fecal Lactoferrin: NEGATIVE
MICRO NUMBER:: 16629255
SPECIMEN QUALITY:: ADEQUATE

## 2023-09-22 LAB — GASTROINTESTINAL PATHOGEN PNL
CampyloBacter Group: NOT DETECTED
Norovirus GI/GII: NOT DETECTED
Rotavirus A: NOT DETECTED
Salmonella species: NOT DETECTED
Shiga Toxin 1: NOT DETECTED
Shiga Toxin 2: NOT DETECTED
Shigella Species: NOT DETECTED
Vibrio Group: NOT DETECTED
Yersinia enterocolitica: NOT DETECTED

## 2023-09-27 LAB — OVA AND PARASITE EXAMINATION

## 2023-10-08 DIAGNOSIS — L290 Pruritus ani: Secondary | ICD-10-CM | POA: Diagnosis not present

## 2023-10-15 ENCOUNTER — Ambulatory Visit: Admitting: Psychiatry

## 2023-10-15 ENCOUNTER — Telehealth: Payer: Self-pay

## 2023-10-15 ENCOUNTER — Inpatient Hospital Stay: Admitting: Psychiatry

## 2023-10-15 DIAGNOSIS — D071 Carcinoma in situ of vulva: Secondary | ICD-10-CM

## 2023-10-15 NOTE — Telephone Encounter (Signed)
 Patient called and her mother is sick and she is an only child and has to take her mother in to be seen.. she will call back to have her appointment rescheduled.

## 2023-10-16 NOTE — Progress Notes (Signed)
 This encounter was created in error - please disregard.

## 2023-10-23 ENCOUNTER — Telehealth: Payer: Self-pay | Admitting: Physician Assistant

## 2023-10-23 ENCOUNTER — Telehealth: Payer: Self-pay

## 2023-10-23 NOTE — Telephone Encounter (Signed)
 Spoke with patient, she is requesting to get her endo and her colonoscopy scheduled. After reviewing provider note, unsure of where patient should be scheduled. Will route to the provider to determine where to schedule.

## 2023-10-23 NOTE — Telephone Encounter (Signed)
 Patient scheduled for EGD and Colonoscopy on 11/20/2023 at 1300. Preop phone call scheduled for 11/05/2023 at 1000. Patient is on Plavix . It is managed by her PCP Oneil Neth. Will send note for clearance to hold medication.

## 2023-10-23 NOTE — Telephone Encounter (Signed)
 Request to hold Plavix  sent to prescribing provider at this time.

## 2023-10-23 NOTE — Telephone Encounter (Signed)
 Inbound call from patient states she is experiencing abdominal pian. Requesting to speak with a nurse. Please advise.   Thank you

## 2023-10-30 NOTE — Telephone Encounter (Signed)
 Received hard copy confirmation fax from Dr. Sheppard office that patient could hold her Plavix  x5 days prior to her endoscopy and colonoscopy. Recommendations say to restart ASAP after the procedure per the GI doctor. Called patient to update her. She verbalized understanding. Placed in records folder to be scanned in to patient's chart.

## 2023-11-01 ENCOUNTER — Telehealth: Payer: Self-pay

## 2023-11-01 NOTE — Telephone Encounter (Signed)
 Scheduled patient appointment which was originally on 7/21.SABRA scheduled for 9/15.SABRA patient confirmed.SABRASABRA

## 2023-11-05 ENCOUNTER — Encounter: Payer: Self-pay | Admitting: Gastroenterology

## 2023-11-05 ENCOUNTER — Ambulatory Visit (AMBULATORY_SURGERY_CENTER)

## 2023-11-05 VITALS — Ht 65.0 in | Wt 135.0 lb

## 2023-11-05 DIAGNOSIS — K529 Noninfective gastroenteritis and colitis, unspecified: Secondary | ICD-10-CM

## 2023-11-05 DIAGNOSIS — R1084 Generalized abdominal pain: Secondary | ICD-10-CM

## 2023-11-05 DIAGNOSIS — Z860100 Personal history of colon polyps, unspecified: Secondary | ICD-10-CM

## 2023-11-05 DIAGNOSIS — R634 Abnormal weight loss: Secondary | ICD-10-CM

## 2023-11-05 DIAGNOSIS — R194 Change in bowel habit: Secondary | ICD-10-CM

## 2023-11-05 MED ORDER — NA SULFATE-K SULFATE-MG SULF 17.5-3.13-1.6 GM/177ML PO SOLN: 1.0000 | Freq: Once | ORAL | 0 refills | Status: AC

## 2023-11-05 NOTE — Progress Notes (Signed)
 No egg or soy allergy known to patient  No issues known to pt with past sedation with any surgeries or procedures Patient denies ever being told they had issues or difficulty with intubation  No FH of Malignant Hyperthermia Pt is not on diet pills Pt is not on  home 02  blood thinners: Plavix   Pt denies issues with constipation  No A fib or A flutter Have any cardiac testing pending-- no  LOA: independent  Prep: suprep   Patient's chart reviewed by Norleen Schillings CNRA prior to previsit and patient appropriate for the [facility].  Previsit completed and red dot placed by patient's name on their procedure day (on provider's schedule).     PV completed with patient. Prep instructions sent via mychart and home address.

## 2023-11-12 ENCOUNTER — Telehealth: Payer: Self-pay | Admitting: Physician Assistant

## 2023-11-12 MED ORDER — ONDANSETRON 4 MG PO TBDP: 4.0000 mg | ORAL_TABLET | Freq: Four times a day (QID) | ORAL | 1 refills | Status: AC | PRN

## 2023-11-12 NOTE — Telephone Encounter (Signed)
 PT is calling about severe nausea. Her PCP is on vacay and unable to prescribe anything. They told her to reach out to us . Past 5-6 days she hasn't been able to eat or function

## 2023-11-12 NOTE — Telephone Encounter (Signed)
 Spoke with patient, she reports that for the past six days she has had nonstop persistent severe nausea. Patient reports that she has not been able to eat or function. She just lies in the bed. She reports that she has not been able to eat she tries to drink but she's only able to keep down water  and ginger ale. Her PCP prescribed her Phenergan  in the past and that helped. She reports she did reach out to her PCP office, but he is out of town. The on call provider for the office, recommended she call GI to have something prescribed since she is scheduled to have EGD and Colon with us . Patient can not remember ever trying Zofran , but the Phenergan  has worked in the past.

## 2023-11-12 NOTE — Telephone Encounter (Signed)
 Orders for Zofran  placed. Attempted to reach patient for update. No answer. Left vm.

## 2023-11-13 NOTE — Telephone Encounter (Signed)
 Called and spoke with patient. Advised her that medication was sent to [facility] yesterday. Discussed how to take SL medication. Patient reports she will pick up the Rx and try it. Encouraged patient to call back if medication does not help. Verbalized understanding.

## 2023-11-20 ENCOUNTER — Encounter: Payer: Self-pay | Admitting: Gastroenterology

## 2023-11-20 ENCOUNTER — Ambulatory Visit: Admitting: Gastroenterology

## 2023-11-20 VITALS — BP 100/42 | HR 67 | Temp 98.1°F | Resp 14 | Ht 65.0 in | Wt 135.0 lb

## 2023-11-20 DIAGNOSIS — Z8719 Personal history of other diseases of the digestive system: Secondary | ICD-10-CM

## 2023-11-20 DIAGNOSIS — R1084 Generalized abdominal pain: Secondary | ICD-10-CM

## 2023-11-20 DIAGNOSIS — K648 Other hemorrhoids: Secondary | ICD-10-CM | POA: Diagnosis not present

## 2023-11-20 DIAGNOSIS — K529 Noninfective gastroenteritis and colitis, unspecified: Secondary | ICD-10-CM | POA: Diagnosis not present

## 2023-11-20 DIAGNOSIS — Z1211 Encounter for screening for malignant neoplasm of colon: Secondary | ICD-10-CM | POA: Diagnosis not present

## 2023-11-20 DIAGNOSIS — K2970 Gastritis, unspecified, without bleeding: Secondary | ICD-10-CM

## 2023-11-20 DIAGNOSIS — R634 Abnormal weight loss: Secondary | ICD-10-CM | POA: Diagnosis not present

## 2023-11-20 DIAGNOSIS — Z860101 Personal history of adenomatous and serrated colon polyps: Secondary | ICD-10-CM | POA: Diagnosis not present

## 2023-11-20 DIAGNOSIS — F419 Anxiety disorder, unspecified: Secondary | ICD-10-CM | POA: Diagnosis not present

## 2023-11-20 DIAGNOSIS — Z860100 Personal history of colon polyps, unspecified: Secondary | ICD-10-CM

## 2023-11-20 DIAGNOSIS — I10 Essential (primary) hypertension: Secondary | ICD-10-CM | POA: Diagnosis not present

## 2023-11-20 DIAGNOSIS — K644 Residual hemorrhoidal skin tags: Secondary | ICD-10-CM

## 2023-11-20 DIAGNOSIS — F32A Depression, unspecified: Secondary | ICD-10-CM | POA: Diagnosis not present

## 2023-11-20 MED ORDER — PANTOPRAZOLE SODIUM 40 MG PO TBEC
40.0000 mg | DELAYED_RELEASE_TABLET | Freq: Every day | ORAL | 3 refills | Status: AC
Start: 2023-11-20 — End: ?

## 2023-11-20 MED ORDER — SODIUM CHLORIDE 0.9 % IV SOLN: 500.0000 mL | Freq: Once | INTRAVENOUS | Status: DC

## 2023-11-20 MED ORDER — COLESTIPOL HCL 1 G PO TABS: ORAL_TABLET | ORAL | 0 refills | Status: AC

## 2023-11-20 NOTE — Progress Notes (Unsigned)
 Pt's states no medical or surgical changes since previsit or office visit.

## 2023-11-20 NOTE — Patient Instructions (Addendum)
 Resume Plavix  today  Await pathology results on stomach biopsies and colon biopsies   Colestipol  and Pantoprazole  medications sent to your pharmacy for pick up - see page 2   Handout on gastritis & hemorrhoids given to you today  Continue previous diet & medications   YOU HAD AN ENDOSCOPIC PROCEDURE TODAY AT THE [facility]:   Refer to the procedure report that was given to you for any specific questions about what was found during the examination.  If the procedure report does not answer your questions, please call your gastroenterologist to clarify.  If you requested that your care partner not be given the details of your procedure findings, then the procedure report has been included in a sealed envelope for you to review at your convenience later.  YOU SHOULD EXPECT: Some feelings of bloating in the abdomen. Passage of more gas than usual.  Walking can help get rid of the air that was put into your GI tract during the procedure and reduce the bloating. If you had a lower endoscopy (such as a colonoscopy or flexible sigmoidoscopy) you may notice spotting of blood in your stool or on the toilet paper. If you underwent a bowel prep for your procedure, you may not have a normal bowel movement for a few days.  Please Note:  You might notice some irritation and congestion in your nose or some drainage.  This is from the oxygen  used during your procedure.  There is no need for concern and it should clear up in a day or so.  SYMPTOMS TO REPORT IMMEDIATELY:  Following lower endoscopy (colonoscopy or flexible sigmoidoscopy):  Excessive amounts of blood in the stool  Significant tenderness or worsening of abdominal pains  Swelling of the abdomen that is new, acute  Fever of 100F or higher  Following upper endoscopy (EGD)  Vomiting of blood or coffee ground material  New chest pain or pain under the shoulder blades  Painful or persistently difficult swallowing  New shortness of  breath  Fever of 100F or higher  Black, tarry-looking stools  For urgent or emergent issues, a gastroenterologist can be reached at any hour by calling [phone]. Do not use MyChart messaging for urgent concerns.    DIET:  We do recommend a small meal at first, but then you may proceed to your regular diet.  Drink plenty of fluids but you should avoid alcoholic beverages for 24 hours.  ACTIVITY:  You should plan to take it easy for the rest of today and you should NOT DRIVE or use heavy machinery until tomorrow (because of the sedation medicines used during the test).    FOLLOW UP: Our staff will call the number listed on your records the next business day following your procedure.  We will call around 7:15- 8:00 am to check on you and address any questions or concerns that you may have regarding the information given to you following your procedure. If we do not reach you, we will leave a message.     If any biopsies were taken you will be contacted by phone or by letter within the next 1-3 weeks.  Please call us  at [phone] if you have not heard about the biopsies in 3 weeks.    SIGNATURES/CONFIDENTIALITY: You and/or your care partner have signed paperwork which will be entered into your electronic medical record.  These signatures attest to the fact that that the information above on your After Visit Summary has been reviewed  and is understood.  Full responsibility of the confidentiality of this discharge information lies with you and/or your care-partner.

## 2023-11-20 NOTE — Progress Notes (Unsigned)
 A/o x 3, VSS, gd SR's, pleased with anesthesia, report to RN

## 2023-11-20 NOTE — Op Note (Addendum)
 [facility] Patient Name: Deborah Peters Procedure Date: 11/20/2023 1:52 PM MRN: [id number] Endoscopist: Gustav ALONSO Mcgee , MD, [id number] Age: 68 Referring MD:  Date of Birth: 1955/11/22 Gender: Female Account #: [id number] Procedure:                Colonoscopy Indications:              High risk colon cancer surveillance: Personal                            history of colonic polyps, High risk colon cancer                            surveillance: Personal history of adenoma less than                            10 mm in size, Incidental diarrhea noted Medicines:                Monitored Anesthesia Care Procedure:                Pre-Anesthesia Assessment:                           - Prior to the procedure, a History and Physical                            was performed, and patient medications and                            allergies were reviewed. The patient's tolerance of                            previous anesthesia was also reviewed. The risks                            and benefits of the procedure and the sedation                            options and risks were discussed with the patient.                            All questions were answered, and informed consent                            was obtained. Prior Anticoagulants: The patient                            last took Plavix  (clopidogrel ) 5 days prior to the                            procedure. ASA Grade Assessment: III - A patient                            with severe systemic disease. After reviewing the  risks and benefits, the patient was deemed in                            satisfactory condition to undergo the procedure.                           After obtaining informed consent, the colonoscope                            was passed under direct vision. Throughout the                            procedure, the patient's blood pressure, pulse, and                             oxygen  saturations were monitored continuously. The                            Olympus Scope SN: [id number] was introduced through                            the anus and advanced to the the cecum, identified                            by appendiceal orifice and ileocecal valve. The                            colonoscopy was performed without difficulty. The                            patient tolerated the procedure well. The quality                            of the bowel preparation was adequate. The                            ileocecal valve, appendiceal orifice, and rectum                            were photographed. Scope In: 2:27:52 PM Scope Out: 2:47:08 PM Scope Withdrawal Time: 0 hours 13 minutes 51 seconds  Total Procedure Duration: 0 hours 19 minutes 16 seconds  Findings:                 The perianal and digital rectal examinations were                            normal.                           Normal mucosa was found in the entire colon.                            Biopsies for histology were taken with a cold  forceps from the right colon and left colon for                            evaluation of microscopic colitis.                           Non-bleeding external and internal hemorrhoids were                            found during retroflexion. The hemorrhoids were                            medium-sized. Complications:            No immediate complications. Estimated Blood Loss:     Estimated blood loss was minimal. Impression:               - Normal mucosa in the entire examined colon.                            Biopsied.                           - Non-bleeding external and internal hemorrhoids. Recommendation:           - Patient has a contact number available for                            emergencies. The signs and symptoms of potential                            delayed complications were discussed with the                             patient. Return to normal activities tomorrow.                            Written discharge instructions were provided to the                            patient.                           - Resume previous diet.                           - Continue present medications.                           - Await pathology results.                           - Repeat colonoscopy date to be determined after                            pending pathology results are reviewed for  surveillance based on pathology results.                           - Colestipol  1gm TID with meals, avoid taking it                            within 2 hours of other medications to avoid drug                            interactions                           - Resume Plavix  (clopidogrel ) at prior dose today.                            Refer to managing physician for further adjustment                            of therapy. Jay Haskew V. Bhavin Monjaraz, MD 11/20/2023 2:56:42 PM This report has been signed electronically.

## 2023-11-20 NOTE — Op Note (Signed)
 [facility] Patient Name: Deborah Peters Procedure Date: 11/20/2023 2:10 PM MRN: [id number] Endoscopist: Gustav ALONSO Mcgee , MD, [id number] Age: 68 Referring MD:  Date of Birth: October 08, 1955 Gender: Female Account #: [id number] Procedure:                Upper GI endoscopy Indications:              Epigastric abdominal pain Medicines:                Monitored Anesthesia Care Procedure:                Pre-Anesthesia Assessment:                           - Prior to the procedure, a History and Physical                            was performed, and patient medications and                            allergies were reviewed. The patient's tolerance of                            previous anesthesia was also reviewed. The risks                            and benefits of the procedure and the sedation                            options and risks were discussed with the patient.                            All questions were answered, and informed consent                            was obtained. Prior Anticoagulants: The patient                            last took Plavix  (clopidogrel ) 5 days prior to the                            procedure. ASA Grade Assessment: III - A patient                            with severe systemic disease. After reviewing the                            risks and benefits, the patient was deemed in                            satisfactory condition to undergo the procedure.                           After obtaining informed consent, the endoscope was  passed under direct vision. Throughout the                            procedure, the patient's blood pressure, pulse, and                            oxygen  saturations were monitored continuously. The                            Olympus Scope SN [id number] was introduced through the                            mouth, and advanced to the second part of duodenum.                            The  upper GI endoscopy was accomplished without                            difficulty. The patient tolerated the procedure                            well. Scope In: Scope Out: Findings:                 The Z-line was regular and was found 36 cm from the                            incisors.                           Patchy mild inflammation characterized by                            congestion (edema), erythema and friability was                            found in the entire examined stomach. Biopsies were                            taken with a cold forceps for Helicobacter pylori                            testing.                           The cardia and gastric fundus were normal on                            retroflexion.                           The examined duodenum was normal. Complications:            No immediate complications. Estimated Blood Loss:     Estimated blood loss: none. Impression:               - Z-line regular, 36  cm from the incisors.                           - Gastritis. Biopsied.                           - Normal examined duodenum. Recommendation:           - Resume previous diet.                           - Continue present medications.                           - Await pathology results.                           - Pantoprazole  40mg  daily, Rx for 90 days with 3                            refills Xavior Niazi V. Adriauna Campton, MD 11/20/2023 3:03:06 PM This report has been signed electronically.

## 2023-11-20 NOTE — Progress Notes (Unsigned)
 [facility] History and Physical   Primary Care Physician:  Shayne Anes, MD   Reason for Procedure:  Epigastric abdominal pain, history of duodenal ulcer, history of colon polyps  Plan:    EGD and colonoscopy with possible interventions as needed     HPI: Deborah Peters is a very pleasant 68 y.o. female here for EGD for evaluation of epigastric pain and history of duodenal ulcer, due for surveillance colonoscopy for history of colon polyps.  History of chronic diarrhea, negative for GI path panel, normal fecal calprotectin and negative fecal lactoferrin Please refer to office visit note by Delon Failing for additional details   The risks and benefits as well as alternatives of endoscopic procedure(s) have been discussed and reviewed. All questions answered. The patient agrees to proceed.    Past Medical History:  Diagnosis Date   Allergy    Anxiety    Aortic aneurysm (HCC) 04/2022   S/P REPAIR ( 3 stents)   Chronic back pain    Constipation due to opioid therapy    COVID    1st one was severe, no hospitalization, 2nd and 3rd were mild; long covid symptoms from 05/2018   DDD (degenerative disc disease), lumbar    Depression    GERD (gastroesophageal reflux disease)    past hx of    Gestational diabetes    Heart murmur    yrs ago   Hepatitis    age 41, ? 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 gastric ulcer.    Past Surgical History:  Procedure Laterality Date   ABDOMINAL AORTIC ENDOVASCULAR STENT GRAFT N/A 05/12/2022   Procedure: ABDOMINAL AORTIC ENDOVASCULAR STENT  GRAFT;  Surgeon: Eliza Lonni RAMAN, MD;  Location: [facility] OR;  Service: Vascular;  Laterality: N/A;   ABDOMINAL HYSTERECTOMY     has her ovaries, heavy periods   BACK SURGERY  2011   L5 S1   CHOLECYSTECTOMY N/A 12/05/2021   Procedure: LAPAROSCOPIC CHOLECYSTECTOMY;  Surgeon: Rubin Calamity, MD;  Location: [facility] OR;  Service: General;  Laterality: N/A;   CO2 LASER APPLICATION N/A 04/03/2023   Procedure: CO2 LASER APPLICATION TO VULVA;  Surgeon: Eldonna Mays, MD;  Location: [facility] [place];  Service: Gynecology;  Laterality: N/A;   COLONOSCOPY  2021   JOINT REPLACEMENT Left    L knee   knee surgeries Left    x 7 before replacement   left ear surgery     yrs ago   POLYPECTOMY     HPP in 2008 DB - colon   VULVECTOMY PARTIAL N/A 11/21/2022   Procedure: VULVECTOMY PARTIAL SIMPLE;  Surgeon: Eldonna Mays, MD;  Location: [facility];  Service: Gynecology;  Laterality: N/A;    Prior to Admission medications   Medication Sig Start Date End Date Taking? Authorizing Provider  acetaminophen  (TYLENOL ) 500 MG tablet Take 500 mg by mouth every 6 (six) hours as needed for moderate pain (pain score 4-6).   Yes [provider]  Ascorbic Acid (VITAMIN C) 1000 MG tablet Take 500 mg by mouth daily.   Yes [provider]  b complex vitamins capsule Take 1 capsule by mouth daily. Raw  B complex   Yes [provider]  cetirizine (ZYRTEC) 10 MG tablet Take 10 mg by mouth daily.   Yes [provider]  CINNAMON PO Take 2 capsules by mouth daily.   Yes [provider]  Coenzyme Q10 (CO Q-10) 100 MG CAPS Take 100 mg by mouth daily.   Yes [provider]  diazepam  (VALIUM ) 5 MG tablet Take 5 mg by mouth 3 (three) times daily as needed for muscle spasms.   Yes [provider]  donepezil  (ARICEPT ) 5 MG tablet Take 5 mg by mouth at bedtime. 11/07/21  Yes [provider]  DULoxetine  (CYMBALTA ) 60 MG capsule Take 60 mg by  mouth daily.   Yes [provider]  ezetimibe  (ZETIA ) 10 MG tablet Take 10 mg by mouth daily. 10/23/17  Yes [provider]  gabapentin  (NEURONTIN ) 400 MG capsule Take 400-800 mg by mouth See admin instructions. Take 400 mg in the morning and and afternoon and 800 mg at bedtidme 03/12/20  Yes [provider]  loperamide (IMODIUM A-D) 2 MG tablet Take 4 mg by mouth 2 (two) times daily as needed for diarrhea or loose stools.   Yes [provider]  losartan  (COZAAR ) 25 MG tablet Take 50 mg by mouth every evening.   Yes [provider]  Magnesium  250 MG TABS Take 250 mg by mouth at bedtime. Patient taking differently: Take 250 mg by mouth at bedtime as needed.   Yes [provider]  ondansetron  (ZOFRAN -ODT) 4 MG disintegrating tablet Take 1 tablet (4 mg total) by mouth every 6 (six) hours as needed for nausea or vomiting. 11/12/23  Yes Beather Delon Gibson, PA  OVER THE COUNTER MEDICATION Take 1 tablet by mouth at bedtime as needed (sleep). alteril natural sleep aid   Yes [provider]  TURMERIC PO Take 1 tablet by mouth daily.   Yes [provider]  vitamin D3 (CHOLECALCIFEROL) 25 MCG tablet Take 1,000 Units by mouth daily.   Yes [provider]  zolpidem  (AMBIEN  CR) 6.25 MG CR tablet Take 6.25 mg by mouth at bedtime as needed for sleep. 12/20/17  Yes [provider]  atorvastatin  (LIPITOR ) 80 MG tablet Take 1 tablet (80 mg total) by mouth at bedtime. 03/18/20 11/05/23  Dickie Begun, MD  benzonatate  (TESSALON ) 100 MG capsule Take 100 mg by mouth 3 (three) times daily as needed for cough. Patient not taking: No sig reported 03/01/20   [provider]  clopidogrel  (PLAVIX ) 75 MG tablet Take 75 mg by mouth daily.    [provider]  hydrocortisone  1 % ointment Apply 1 Application topically daily. 06/25/23   Eldonna Mays, MD  lidocaine  (XYLOCAINE ) 5 % ointment Apply 1 Application topically 3  (three) times daily as needed for moderate pain (pain score 4-6). Apply to lasered area on vulva as needed for discomfort 04/02/23   Cross, Melissa D, NP  LORazepam  (ATIVAN ) 0.5 MG tablet Take 0.5 mg by mouth daily as needed.    [provider]  omega-3 acid ethyl esters (LOVAZA) 1 g capsule Take by mouth daily. Patient not taking: Reported on 10/11/2023    [provider]  Omega-3 Fatty Acids (FISH OIL) 1200 MG CAPS Take 2,400 mg by mouth 2 (two) times daily. Patient not taking: No sig reported  [provider]  promethazine  (PHENERGAN ) 25 MG tablet Take 25 mg by mouth 2 (two) times daily as needed for nausea or vomiting. 03/15/20   [provider]  triamcinolone  (NASACORT ) 55 MCG/ACT AERO nasal inhaler Place 2 sprays into the nose daily. Patient not taking: No sig reported    [provider]    Current Outpatient Medications  Medication Sig Dispense Refill   acetaminophen  (TYLENOL ) 500 MG tablet Take 500 mg by mouth every 6 (six) hours as needed for moderate pain (pain score 4-6).     Ascorbic Acid (VITAMIN C) 1000 MG tablet Take 500 mg by mouth daily.     b complex vitamins capsule Take 1 capsule by mouth daily. Raw  B complex     cetirizine (ZYRTEC) 10 MG tablet Take 10 mg by mouth daily.     CINNAMON PO Take 2 capsules by mouth daily.     Coenzyme Q10 (CO Q-10) 100 MG CAPS Take 100 mg by mouth daily.     diazepam  (VALIUM ) 5 MG tablet Take 5 mg by mouth 3 (three) times daily as needed for muscle spasms.     donepezil  (ARICEPT ) 5 MG tablet Take 5 mg by mouth at bedtime.     DULoxetine  (CYMBALTA ) 60 MG capsule Take 60 mg by mouth daily.     ezetimibe  (ZETIA ) 10 MG tablet Take 10 mg by mouth daily.  3   gabapentin  (NEURONTIN ) 400 MG capsule Take 400-800 mg by mouth See admin instructions. Take 400 mg in the morning and and afternoon and 800 mg at bedtidme     loperamide (IMODIUM A-D) 2 MG tablet Take 4 mg by mouth 2 (two) times daily as needed for  diarrhea or loose stools.     losartan  (COZAAR ) 25 MG tablet Take 50 mg by mouth every evening.     Magnesium  250 MG TABS Take 250 mg by mouth at bedtime. (Patient taking differently: Take 250 mg by mouth at bedtime as needed.)     ondansetron  (ZOFRAN -ODT) 4 MG disintegrating tablet Take 1 tablet (4 mg total) by mouth every 6 (six) hours as needed for nausea or vomiting. 30 tablet 1   OVER THE COUNTER MEDICATION Take 1 tablet by mouth at bedtime as needed (sleep). alteril natural sleep aid     TURMERIC PO Take 1 tablet by mouth daily.     vitamin D3 (CHOLECALCIFEROL) 25 MCG tablet Take 1,000 Units by mouth daily.     zolpidem  (AMBIEN  CR) 6.25 MG CR tablet Take 6.25 mg by mouth at bedtime as needed for sleep.  5   atorvastatin  (LIPITOR ) 80 MG tablet Take 1 tablet (80 mg total) by mouth at bedtime. 30 tablet 1   benzonatate  (TESSALON ) 100 MG capsule Take 100 mg by mouth 3 (three) times daily as needed for cough. (Patient not taking: No sig reported)     clopidogrel  (PLAVIX ) 75 MG tablet Take 75 mg by mouth daily.     hydrocortisone  1 % ointment Apply 1 Application topically daily. 28 g 1   lidocaine  (XYLOCAINE ) 5 % ointment Apply 1 Application topically 3 (three) times daily as needed for moderate pain (pain score 4-6). Apply to lasered area on vulva as needed for discomfort 35.44 g 1   LORazepam  (ATIVAN ) 0.5 MG tablet Take 0.5 mg by mouth daily as needed.     omega-3 acid ethyl esters (LOVAZA) 1 g capsule Take by mouth daily. (Patient not taking: Reported on 10/11/2023)     Omega-3 Fatty  Acids (FISH OIL) 1200 MG CAPS Take 2,400 mg by mouth 2 (two) times daily. (Patient not taking: No sig reported)     promethazine  (PHENERGAN ) 25 MG tablet Take 25 mg by mouth 2 (two) times daily as needed for nausea or vomiting.     triamcinolone  (NASACORT ) 55 MCG/ACT AERO nasal inhaler Place 2 sprays into the nose daily. (Patient not taking: No sig reported)     Current Facility-Administered Medications  Medication  Dose Route Frequency Provider Last Rate Last Admin   0.9 %  sodium chloride  infusion  500 mL Intravenous Once Brinson Tozzi V, MD        Allergies as of 11/20/2023 - Review Complete 11/20/2023  Allergen Reaction Noted   Sulfa antibiotics Anaphylaxis 05/20/2012   Simvastatin Other (See Comments) 06/03/2009    Family History  Problem Relation Age of Onset   Stroke Mother    Heart failure Father        Triple bypass   Melanoma Father    Colon polyps Sister    Colon polyps Brother    Cancer Maternal Grandmother        unknown type of cancer   Cancer Maternal Grandfather        unknown type of cancer   Stomach cancer Paternal Grandfather    Esophageal cancer Neg Hx    Colon cancer Neg Hx    Pancreatic cancer Neg Hx    Rectal cancer Neg Hx     Social History   Socioeconomic History   Marital status: Married    Spouse name: Not on file   Number of children: 2   Years of education: Not on file   Highest education level: Not on file  Occupational History   Occupation: disabled  Tobacco Use   Smoking status: Former    Current packs/day: 0.00    Average packs/day: 1 pack/day for 18.0 years (18.0 ttl pk-yrs)    Types: Cigarettes    Start date: 43    Quit date: 1991    Years since quitting: 34.6    Passive exposure: Current   Smokeless tobacco: Never   Tobacco comments:    Occasional cigarette since quitting when younger  Vaping Use   Vaping status: Never Used  Substance and Sexual Activity   Alcohol  use: Not Currently    Comment: Rare   Drug use: No   Sexual activity: Yes  Other Topics Concern   Not on file  Social History Narrative   Not on file   Social Drivers of Health   Financial Resource Strain: Not on file  Food Insecurity: Not on file  Transportation Needs: Not on file  Physical Activity: Not on file  Stress: Not on file  Social Connections: Unknown (08/05/2021)   Received from [facility]   Social Network    Social Network: Not on file   Intimate Partner Violence: Unknown (06/29/2021)   Received from [facility]   HITS    Physically Hurt: Not on file    Insult or Talk Down To: Not on file    Threaten Physical Harm: Not on file    Scream or Curse: Not on file    Review of Systems:  All other review of systems negative except as mentioned in the HPI.  Physical Exam: Vital signs in last 24 hours: BP 123/65   Pulse 88   Temp 98.1 F (36.7 C) (Temporal)   Ht 5' 5 (1.651 m)   Wt 135 lb (61.2 kg)  SpO2 95%   BMI 22.47 kg/m  General:   Alert, NAD Lungs:  Clear .   Heart:  Regular rate and rhythm Abdomen:  Soft, nontender and nondistended. Neuro/Psych:  Alert and cooperative. Normal mood and affect. A and O x 3  Reviewed labs, radiology imaging, old records and pertinent past GI work up  Patient is appropriate for planned procedure(s) and anesthesia in an ambulatory setting   K. Veena Genia Perin , MD [phone]

## 2023-11-20 NOTE — Progress Notes (Unsigned)
 Called to room to assist during endoscopic procedure.  Patient ID and intended procedure confirmed with present staff. Received instructions for my participation in the procedure from the performing physician.

## 2023-11-21 ENCOUNTER — Telehealth: Payer: Self-pay | Admitting: *Deleted

## 2023-11-21 NOTE — Telephone Encounter (Signed)
 Post procedure follow up call placed, no answer and left VM.

## 2023-11-23 LAB — SURGICAL PATHOLOGY

## 2023-12-10 ENCOUNTER — Inpatient Hospital Stay: Admitting: Psychiatry

## 2023-12-10 ENCOUNTER — Telehealth: Payer: Self-pay

## 2023-12-10 DIAGNOSIS — D071 Carcinoma in situ of vulva: Secondary | ICD-10-CM

## 2023-12-10 NOTE — Telephone Encounter (Signed)
 The office received a call from [name] regarding her appointment today with Dr. Eldonna. She reports falling this morning at home and her foot is swelling. She is waiting on her son to call her so he can take her to get checked out.   Pt rescheduled to 9/22 @ 3:15. Pt agreed to date/time

## 2023-12-11 NOTE — Progress Notes (Signed)
 This encounter was created in error - please disregard.

## 2023-12-14 ENCOUNTER — Telehealth: Payer: Self-pay

## 2023-12-14 NOTE — Telephone Encounter (Signed)
 Per Provider request, LVM for Deborah Peters to call office regarding rescheduling appointment from 9/22 to a date in November.

## 2023-12-14 NOTE — Telephone Encounter (Signed)
 Ms.Frate returned call. Appointment with Dr.Newton on 11/10 @ 2:30

## 2023-12-17 ENCOUNTER — Inpatient Hospital Stay: Admitting: Psychiatry

## 2023-12-17 DIAGNOSIS — D071 Carcinoma in situ of vulva: Secondary | ICD-10-CM

## 2023-12-18 NOTE — Progress Notes (Signed)
 This encounter was created in error - please disregard.

## 2024-01-03 DIAGNOSIS — D071 Carcinoma in situ of vulva: Secondary | ICD-10-CM | POA: Diagnosis not present

## 2024-01-03 DIAGNOSIS — R110 Nausea: Secondary | ICD-10-CM | POA: Diagnosis not present

## 2024-01-03 DIAGNOSIS — I700 Atherosclerosis of aorta: Secondary | ICD-10-CM | POA: Diagnosis not present

## 2024-01-03 DIAGNOSIS — I2510 Atherosclerotic heart disease of native coronary artery without angina pectoris: Secondary | ICD-10-CM | POA: Diagnosis not present

## 2024-01-03 DIAGNOSIS — L290 Pruritus ani: Secondary | ICD-10-CM | POA: Diagnosis not present

## 2024-01-03 DIAGNOSIS — E785 Hyperlipidemia, unspecified: Secondary | ICD-10-CM | POA: Diagnosis not present

## 2024-01-03 DIAGNOSIS — F329 Major depressive disorder, single episode, unspecified: Secondary | ICD-10-CM | POA: Diagnosis not present

## 2024-01-03 DIAGNOSIS — K219 Gastro-esophageal reflux disease without esophagitis: Secondary | ICD-10-CM | POA: Diagnosis not present

## 2024-01-03 DIAGNOSIS — E1169 Type 2 diabetes mellitus with other specified complication: Secondary | ICD-10-CM | POA: Diagnosis not present

## 2024-01-03 DIAGNOSIS — I10 Essential (primary) hypertension: Secondary | ICD-10-CM | POA: Diagnosis not present

## 2024-01-03 DIAGNOSIS — Z23 Encounter for immunization: Secondary | ICD-10-CM | POA: Diagnosis not present

## 2024-01-03 DIAGNOSIS — U099 Post covid-19 condition, unspecified: Secondary | ICD-10-CM | POA: Diagnosis not present

## 2024-01-03 DIAGNOSIS — R197 Diarrhea, unspecified: Secondary | ICD-10-CM | POA: Diagnosis not present

## 2024-01-07 DIAGNOSIS — M25571 Pain in right ankle and joints of right foot: Secondary | ICD-10-CM | POA: Diagnosis not present

## 2024-01-07 DIAGNOSIS — M79672 Pain in left foot: Secondary | ICD-10-CM | POA: Diagnosis not present

## 2024-01-07 DIAGNOSIS — M25572 Pain in left ankle and joints of left foot: Secondary | ICD-10-CM | POA: Diagnosis not present

## 2024-01-07 DIAGNOSIS — M79671 Pain in right foot: Secondary | ICD-10-CM | POA: Diagnosis not present

## 2024-01-07 DIAGNOSIS — S93492A Sprain of other ligament of left ankle, initial encounter: Secondary | ICD-10-CM | POA: Diagnosis not present

## 2024-01-23 ENCOUNTER — Ambulatory Visit: Payer: Self-pay | Admitting: Gastroenterology

## 2024-02-04 ENCOUNTER — Telehealth: Payer: Self-pay

## 2024-02-04 ENCOUNTER — Inpatient Hospital Stay: Admitting: Psychiatry

## 2024-02-04 ENCOUNTER — Telehealth: Payer: Self-pay | Admitting: Psychiatry

## 2024-02-04 DIAGNOSIS — D071 Carcinoma in situ of vulva: Secondary | ICD-10-CM

## 2024-02-04 DIAGNOSIS — N901 Moderate vulvar dysplasia: Secondary | ICD-10-CM

## 2024-02-04 NOTE — Telephone Encounter (Signed)
 [name], pt's husband. Called stating Derinda woke up with a fever this morning. He states no reschedule at this time. Reports his wife has an upcoming appointment next month with her PCP and will call back to schedule with Dr.Newton after that appointment

## 2024-02-04 NOTE — Telephone Encounter (Signed)
 Called patient in regards to her canceled appointment.  Reviewed that she has had 6 canceled appointments since her last follow-up with me in March.  The majority if not all of these visits have been canceled on the same day.  Discussed with patient if there would be something we could do to help assist her in better follow-up for with this to be done more effectively with her local OB/GYN.  Patient reports that her life has been impacted by long COVID and the symptoms have been coming on more quickly and more frequently recently.  She reports this has been very debilitating for her.  She understands the challenges with last-minute cancellations.  She does report that on occasion she has low-grade fevers (temperature between 99 and 100 but not greater than 100.4).  Discussed that if she were to have a low-grade fever, I would be okay for her to present for visit and wear a mask.  Patient wishes to follow-up with her primary care doctor in the next month and then will call us  to schedule follow-up after that point.  Reviewed that we will wait on her to schedule her next follow-up at this time.  Patient understanding and amenable to plan.

## 2024-02-04 NOTE — Progress Notes (Unsigned)
 This encounter was created in error - please disregard.
